# Patient Record
Sex: Female | Born: 1970 | Race: White | Hispanic: No | Marital: Single | Smoking: Never smoker
Health system: Southern US, Community
[De-identification: ages and names within clinical notes are randomized; demographics above are authoritative.]

## PROBLEM LIST (undated history)

## (undated) DIAGNOSIS — R490 Dysphonia: Secondary | ICD-10-CM

## (undated) DIAGNOSIS — R109 Unspecified abdominal pain: Secondary | ICD-10-CM

## (undated) DIAGNOSIS — R059 Cough, unspecified: Secondary | ICD-10-CM

## (undated) DIAGNOSIS — R630 Anorexia: Secondary | ICD-10-CM

## (undated) DIAGNOSIS — R05 Cough: Secondary | ICD-10-CM

## (undated) DIAGNOSIS — K219 Gastro-esophageal reflux disease without esophagitis: Secondary | ICD-10-CM

## (undated) DIAGNOSIS — R5383 Other fatigue: Secondary | ICD-10-CM

## (undated) DIAGNOSIS — F32A Depression, unspecified: Secondary | ICD-10-CM

## (undated) DIAGNOSIS — Z460 Encounter for fitting and adjustment of spectacles and contact lenses: Secondary | ICD-10-CM

## (undated) DIAGNOSIS — F329 Major depressive disorder, single episode, unspecified: Secondary | ICD-10-CM

## (undated) DIAGNOSIS — F419 Anxiety disorder, unspecified: Secondary | ICD-10-CM

## (undated) HISTORY — DX: Dysphonia: R49.0

## (undated) HISTORY — DX: Cough, unspecified: R05.9

## (undated) HISTORY — DX: Cough: R05

## (undated) HISTORY — DX: Anorexia: R63.0

## (undated) HISTORY — PX: SKIN BIOPSY: SHX1

## (undated) HISTORY — DX: Gastro-esophageal reflux disease without esophagitis: K21.9

## (undated) HISTORY — DX: Other fatigue: R53.83

## (undated) HISTORY — DX: Unspecified abdominal pain: R10.9

## (undated) HISTORY — DX: Encounter for fitting and adjustment of spectacles and contact lenses: Z46.0

---

## 1991-01-12 HISTORY — PX: ORIF FEMUR FRACTURE: SHX2119

## 1998-03-13 ENCOUNTER — Other Ambulatory Visit: Admission: RE | Admit: 1998-03-13 | Discharge: 1998-03-13 | Payer: Self-pay | Admitting: Obstetrics and Gynecology

## 2000-03-30 ENCOUNTER — Other Ambulatory Visit: Admission: RE | Admit: 2000-03-30 | Discharge: 2000-03-30 | Payer: Self-pay | Admitting: Obstetrics and Gynecology

## 2001-04-18 ENCOUNTER — Other Ambulatory Visit: Admission: RE | Admit: 2001-04-18 | Discharge: 2001-04-18 | Payer: Self-pay | Admitting: Obstetrics and Gynecology

## 2001-07-09 ENCOUNTER — Encounter: Payer: Self-pay | Admitting: Emergency Medicine

## 2001-07-09 ENCOUNTER — Emergency Department (HOSPITAL_COMMUNITY): Admission: EM | Admit: 2001-07-09 | Discharge: 2001-07-09 | Payer: Self-pay | Admitting: Emergency Medicine

## 2002-11-27 ENCOUNTER — Ambulatory Visit (HOSPITAL_COMMUNITY): Admission: RE | Admit: 2002-11-27 | Discharge: 2002-11-27 | Payer: Self-pay | Admitting: Infectious Diseases

## 2005-12-17 ENCOUNTER — Ambulatory Visit (HOSPITAL_COMMUNITY): Admission: RE | Admit: 2005-12-17 | Discharge: 2005-12-17 | Payer: Self-pay | Admitting: Surgery

## 2005-12-21 ENCOUNTER — Encounter: Admission: RE | Admit: 2005-12-21 | Discharge: 2006-03-21 | Payer: Self-pay | Admitting: Surgery

## 2005-12-27 ENCOUNTER — Ambulatory Visit (HOSPITAL_COMMUNITY): Admission: RE | Admit: 2005-12-27 | Discharge: 2005-12-27 | Payer: Self-pay | Admitting: Surgery

## 2006-02-14 ENCOUNTER — Ambulatory Visit (HOSPITAL_COMMUNITY): Admission: RE | Admit: 2006-02-14 | Discharge: 2006-02-15 | Payer: Self-pay | Admitting: Surgery

## 2006-02-14 HISTORY — PX: OTHER SURGICAL HISTORY: SHX169

## 2006-05-23 ENCOUNTER — Emergency Department (HOSPITAL_COMMUNITY): Admission: EM | Admit: 2006-05-23 | Discharge: 2006-05-23 | Payer: Self-pay | Admitting: Emergency Medicine

## 2006-05-23 ENCOUNTER — Ambulatory Visit (HOSPITAL_COMMUNITY): Admission: RE | Admit: 2006-05-23 | Discharge: 2006-05-23 | Payer: Self-pay | Admitting: Emergency Medicine

## 2007-04-03 ENCOUNTER — Encounter: Admission: RE | Admit: 2007-04-03 | Discharge: 2007-04-03 | Payer: Self-pay | Admitting: Surgery

## 2010-01-13 ENCOUNTER — Ambulatory Visit (HOSPITAL_COMMUNITY)
Admission: RE | Admit: 2010-01-13 | Discharge: 2010-01-13 | Payer: Self-pay | Source: Home / Self Care | Attending: Gastroenterology | Admitting: Gastroenterology

## 2010-05-29 NOTE — Op Note (Signed)
NAME:  Colleen Schmitt, Colleen Schmitt           ACCOUNT NO.:  [id number]   MEDICAL RECORD NO.:  [id number]          PATIENT TYPE:  AMB   LOCATION:  DAY                          FACILITY:  [facility]   PHYSICIAN:  Sandria Bales. Ezzard Standing, M.D.  DATE OF BIRTH:  07-30-1970   DATE OF PROCEDURE:  02/14/2006  DATE OF DISCHARGE:                               OPERATIVE REPORT   PREOPERATIVE DIAGNOSIS:  Morbid obesity with a weight of 239 pounds, a  basal metabolic index of 35.4.   POSTOPERATIVE DIAGNOSIS:  Morbid obesity with a weight of 239 pounds, a  basal metabolic index of 35.4.   PROCEDURE:  Laparoscopic band placement with an APS band.   SURGEON:  Sandria Bales. Ezzard Standing, M.D.   ASSISTANT:  Thornton Park. Daphine Deutscher, M.D.   ANESTHESIA:  General endotracheal.   ESTIMATED BLOOD LOSS:  Minimal.   INDICATIONS FOR PROCEDURE:  Ms. Propp is a 40 year old white female  who is a patient of Dr. Adelfa Koh. Tisovec, who has been morbidly obese  much of her adult life.  She has multiple co-morbid problems associated  with her obesity, which include adult asthma, gastroesophageal reflux,  low back and knee pain, secondary to degenerative joint disease.  The  patient now comes for an attempted laparoscopic band procedure. She has  been through our preoperative evaluation.   The indications and potential complications of lap banding were  explained to the patient.  The potential complications include, but are  not limited to, bleeding, infection, long-term nutritional consequences  and possible band erosion and band migration.   DESCRIPTION OF PROCEDURE:  The patient is placed in the supine position  and given a general endotracheal anesthetic.  Her abdomen was prepped  with Betadine solution and sterilely draped.  She had been given 1 gram  of Ancef preoperatively.  She had PAS stockings in place and was on  subcutaneous heparin.  A time out was held, identifying the patient  and the procedure.   The abdominal cavity was  then accessed through the left upper quadrant  with an 11 mm Optiview trocar.  The abdomen was insufflated.  An  abdominal exploration was carried out revealing a somewhat large liver  and fatty falciform ligament.  Her stomach and both lobes of the liver  otherwise unremarkable.  A 5 mm port was placed for the sub-xiphoid for  the liver retractor.  A 15 mm in the right subcostal for port insertion.  An 11 mm trocar in the right paramedian.  An 11 mm in the left  paramedian for the camera and then a 5 mm port lateral to the access to  port.  I placed in a Nathanson liver retractor.   A lot of Sinia's weight is in her buttocks and hips as she is a pear-  shaped patient, so her upper abdomen had less than probably average fat  in it.  I identified the gastroesophageal junction and opened the angle  of His.  I opened the lesser sac and then went along at the base of the  right crus and passed the  finger dissector in a retro-gastric  fashion.  I then introduced the APS band system through the 15mm port  and passed this around the proximal stomach or immediately below the  gastroesophageal junction.  At this time anesthesia passed a lap band  sizer downt the esophagus.  The lap band was closed without difficulty  around the sizer.  The sizer was then removed.   I then imbricated the stomach around the band, placing three sutures of  #2-0 Ethibond sutures.  The band seemed to lay in good position.  There  was no bleeding at the site.  Again we did a really minimal dissection.  The tubing was then brought out through the right paramedian incision.  All the trocars were removed and turned under direct visualization.  There is no bleeding at any trocar site.   I enlarged the right upper quadrant incision enough to place the  reservoir.  I placed four Prolene sutures and attached the reservoir to  the band tubing and sewed it in place.  The reservoir lay flat.  I then  closed the skin with a  #3-0 Ethibond suture and the skin site with a #5-  0 Monocryl suture.  Painted with tincture of Benzoin and Steri-Strips.   The patient tolerated the procedure well and was transported to the  recovery room in good condition.  The sponge and needle counts were  correct at the end of the case.      Sandria Bales. Ezzard Standing, M.D.  Electronically Signed     DHN/MEDQ  D:  02/14/2006  T:  02/14/2006  Job:  [id number]   cc:   Gaspar Garbe, M.D.  Fax: [phone]

## 2011-01-22 ENCOUNTER — Encounter (INDEPENDENT_AMBULATORY_CARE_PROVIDER_SITE_OTHER): Payer: Self-pay

## 2011-01-22 ENCOUNTER — Ambulatory Visit (INDEPENDENT_AMBULATORY_CARE_PROVIDER_SITE_OTHER): Payer: Commercial Managed Care - PPO | Admitting: Physician Assistant

## 2011-01-22 VITALS — BP 122/76 | HR 68 | Temp 97.6°F | Resp 16 | Ht 69.0 in | Wt 204.6 lb

## 2011-01-22 DIAGNOSIS — Z4651 Encounter for fitting and adjustment of gastric lap band: Secondary | ICD-10-CM

## 2011-01-22 DIAGNOSIS — E669 Obesity, unspecified: Secondary | ICD-10-CM

## 2011-01-22 DIAGNOSIS — K219 Gastro-esophageal reflux disease without esophagitis: Secondary | ICD-10-CM

## 2011-01-22 NOTE — Patient Instructions (Signed)
Take nexium twice daily for one week followed by once daily for a week. Return in two weeks or sooner if your reflux symptoms persist.

## 2011-01-22 NOTE — Progress Notes (Signed)
  HISTORY: Colleen Schmitt is a 41 y.o.female who received an AP-Standard lap-band in February 2008 by Dr. Ezzard Standing. She comes in with a two week history of GERD symptoms and solid food dysphagia. She has suffered an upper respiratory infection with cough that began on Christmas and likely precipitated her other symptoms. She has been taking Nexium with limited success.  VITAL SIGNS: Filed Vitals:   01/22/11 1406  BP: 122/76  Pulse: 68  Temp: 97.6 F (36.4 C)  Resp: 16    PHYSICAL EXAM: Physical exam reveals a very well-appearing 41 y.o.female in no apparent distress Neurologic: Awake, alert, oriented Psych: Bright affect, conversant Respiratory: Breathing even and unlabored. No stridor or wheezing Abdomen: Soft, nontender, nondistended to palpation. Incisions well-healed. No incisional hernias. Port easily palpated. Extremities: Atraumatic, good range of motion.  ASSESMENT: 41 y.o.  female  s/p AP-Standard lap-band.   PLAN: The patient's port was accessed with a 20G Huber needle without difficulty. Clear fluid was aspirated and 0.5 mL saline was removed from the port to give a total predicted volume of 3.5 mL. The patient was able to swallow water without difficulty following the procedure and was instructed to take clear liquids for the next 24-48 hours and advance slowly as tolerated. I asked her to take nexium twice daily for a week followed by once daily for a week. Return in two weeks or sooner if necessary.

## 2011-02-11 ENCOUNTER — Encounter (INDEPENDENT_AMBULATORY_CARE_PROVIDER_SITE_OTHER): Payer: Commercial Managed Care - PPO

## 2011-02-12 ENCOUNTER — Encounter (INDEPENDENT_AMBULATORY_CARE_PROVIDER_SITE_OTHER): Payer: Self-pay

## 2011-02-12 ENCOUNTER — Encounter (INDEPENDENT_AMBULATORY_CARE_PROVIDER_SITE_OTHER): Payer: Commercial Managed Care - PPO

## 2011-02-12 ENCOUNTER — Ambulatory Visit (INDEPENDENT_AMBULATORY_CARE_PROVIDER_SITE_OTHER): Payer: Commercial Managed Care - PPO | Admitting: Physician Assistant

## 2011-02-12 VITALS — BP 126/84 | Ht 69.0 in | Wt 213.4 lb

## 2011-02-12 DIAGNOSIS — Z4651 Encounter for fitting and adjustment of gastric lap band: Secondary | ICD-10-CM

## 2011-02-12 NOTE — Patient Instructions (Signed)
Take clear liquids for the next 48 hours. Thin protein shakes are ok to start on Saturday evening. You may begin foods with the consistency of yogurt, cottage cheese, cream soups, etc. on Sunday. Call us if you have persistent vomiting or regurgitation, night cough or reflux symptoms. Return as scheduled or sooner if you notice no changes in hunger/portion sizes.   

## 2011-02-12 NOTE — Progress Notes (Signed)
  HISTORY: Colleen Schmitt is a 41 y.o.female who received an AP-Standard lap-band in February 2008 by Dr. Ezzard Standing. She comes in after 3 weeks on a lap-band holiday secondary to an upper respiratory infection that caused some over-restriction. She now has no further symptoms except for hunger and increased portion sizes.  VITAL SIGNS: Filed Vitals:   02/12/11 1519  BP: 126/84    PHYSICAL EXAM: Physical exam reveals a very well-appearing 42 y.o.female in no apparent distress Neurologic: Awake, alert, oriented Psych: Bright affect, conversant Respiratory: Breathing even and unlabored. No stridor or wheezing Abdomen: Soft, nontender, nondistended to palpation. Incisions well-healed. No incisional hernias. Port easily palpated. Extremities: Atraumatic, good range of motion.  ASSESMENT: 41 y.o.  female  s/p AP-Standard lap-band.   PLAN: The patient's port was accessed with a 20G Huber needle without difficulty. Clear fluid was aspirated and 0.5 mL saline was added to the port to give a total predicted volume of 4 mL. The patient was able to swallow water without difficulty following the procedure and was instructed to take clear liquids for the next 24-48 hours and advance slowly as tolerated.

## 2011-03-01 ENCOUNTER — Telehealth (INDEPENDENT_AMBULATORY_CARE_PROVIDER_SITE_OTHER): Payer: Self-pay

## 2011-03-01 NOTE — Telephone Encounter (Signed)
Patient would like a band fill.  She needs more restriction.  She needs an appointment 1st thing in the am or late pm.  She will be coming from 30 minutes away so please call her if you  can work her in during those time frames.

## 2011-03-05 ENCOUNTER — Encounter (INDEPENDENT_AMBULATORY_CARE_PROVIDER_SITE_OTHER): Payer: Commercial Managed Care - PPO

## 2011-03-11 ENCOUNTER — Encounter (INDEPENDENT_AMBULATORY_CARE_PROVIDER_SITE_OTHER): Payer: Commercial Managed Care - PPO

## 2011-03-26 ENCOUNTER — Ambulatory Visit (INDEPENDENT_AMBULATORY_CARE_PROVIDER_SITE_OTHER): Payer: Commercial Managed Care - PPO | Admitting: General Surgery

## 2011-03-26 ENCOUNTER — Encounter (INDEPENDENT_AMBULATORY_CARE_PROVIDER_SITE_OTHER): Payer: Self-pay | Admitting: General Surgery

## 2011-03-26 VITALS — BP 122/86 | HR 81 | Temp 97.2°F | Ht 69.0 in | Wt 205.8 lb

## 2011-03-26 DIAGNOSIS — E669 Obesity, unspecified: Secondary | ICD-10-CM

## 2011-03-26 NOTE — Progress Notes (Signed)
Chief complaint: Dysphagia and reflux post lap band fill  History: Patient worked in urgently to the office due to worsening dysphagia and reflux. She is status post lap band placement in February 2008 by Dr. Ezzard Standing. She last had a fill of 0.5 cc on February 1. She previously had had to have fluid removed about January of this year as well. She developed an acute illness with nausea vomiting and diarrhea diagnosed as norovirus over a week ago by her PCP. Since that time she has had persistent nighttime reflux and some regurgitation and dysphagia of solid foods and some difficulty with liquids.  With this information I went ahead and removed the 0.5 cc filled that she had 6 weeks ago. She was able to tolerate water well. I asked her to return to see Dr. Ezzard Standing in one month. I told her I would consider a 0.25 cc fill at that time.

## 2011-04-06 ENCOUNTER — Ambulatory Visit (INDEPENDENT_AMBULATORY_CARE_PROVIDER_SITE_OTHER): Payer: Commercial Managed Care - PPO | Admitting: Surgery

## 2011-04-06 ENCOUNTER — Encounter (INDEPENDENT_AMBULATORY_CARE_PROVIDER_SITE_OTHER): Payer: Self-pay | Admitting: Surgery

## 2011-04-06 VITALS — BP 132/76 | Ht 69.0 in | Wt 207.0 lb

## 2011-04-06 DIAGNOSIS — K219 Gastro-esophageal reflux disease without esophagitis: Secondary | ICD-10-CM

## 2011-04-06 MED ORDER — PANTOPRAZOLE SODIUM 40 MG PO TBEC: 40.0000 mg | DELAYED_RELEASE_TABLET | Freq: Every day | ORAL | Status: DC

## 2011-04-06 NOTE — Progress Notes (Signed)
Pt presents due to heartburn and mild dysphagia.  She is able to eat but feel a mild sensation of food getting caught and heartburn.  She has a Lap band in place and had fluid removed 2 weeks ago with some improvement. Exam: soft non tender band noted.  Impression GERD and mild dysphagia Plan: I discussed medical treatment and removing fluid from the band as well.  She is traveling and felt that removing fluid would be helpful.  Under sterile conditions I removed 0.5 CC without difficulty.  I recommend protonix 40 mg daily and mylanta 15 cc daily.  She is scheduled to return to see Dr Ezzard Standing in two weeks. If this persists she may need UGI.

## 2011-04-06 NOTE — Patient Instructions (Signed)
Try protonix 40 mg daily  Add mylanta 15 ml by mouth every 4 hours as needed for heartburn.

## 2011-04-22 ENCOUNTER — Ambulatory Visit (INDEPENDENT_AMBULATORY_CARE_PROVIDER_SITE_OTHER): Payer: Commercial Managed Care - PPO | Admitting: Surgery

## 2011-04-22 ENCOUNTER — Encounter (INDEPENDENT_AMBULATORY_CARE_PROVIDER_SITE_OTHER): Payer: Self-pay | Admitting: Surgery

## 2011-04-22 VITALS — BP 112/72 | HR 76 | Temp 99.0°F | Resp 16 | Ht 69.0 in | Wt 206.2 lb

## 2011-04-22 DIAGNOSIS — Z9884 Bariatric surgery status: Secondary | ICD-10-CM

## 2011-04-22 NOTE — Progress Notes (Signed)
[facility]  Ovidio Kin, MD,  FACS [address] [place]  [address] [place], [place]    [address] Phone:  [phone] FAX:  [phone]   Re:   Colleen Schmitt DOB:   [birth year] MRN:   [id number]  ASSESSMENT AND PLAN: 1.  Lap band, APS, 02/14/2006.  Initial weight - 241, BMI - 35.6  She has done well, though she has had trouble breaking the 200 pound barrier.  She has been to the office 3 times this year for fluid out, fluid in, fluid out - she had reflux before the last fluid out.  She is on the border between the yellow zone and green zone.  I think she is best to stay put for 3 months - then see where she is.  She has held her weight stable during that time.  Return in 3 months.  2.  Recent trouble, such as reflux, with lap band being too tight.  In part this has been due to illness.  HISTORY OF PRESENT ILLNESS: Chief Complaint  Patient presents with  . Lap Band Fill    Colleen Schmitt is a 41 y.o. (DOB: [birth year])  white female who is a patient of TISOVEC,RICHARD W, MD, MD and comes to me today for follow up of lap band. She just got back from [place].   She has been to the office 3 times this year for fluid out, fluid in, fluid out - she had reflux before the last fluid out.  She has about 3.5 cc in her band.  She had a chick fillet sandwich for lunch and did not feel full, but she did not think that she could eat another sandwich.  So I think, she is on the border between the yellow zone and green zone.  Because she has been up and down so much this year, I think she is best to stay put for 3 months - then see where she is.  She has held her weight stable during that time.  She feels comfortable with this plan.  PHYSICAL EXAM: BP 112/72  Pulse 76  Temp(Src) 99 F (37.2 C) (Temporal)  Resp 16  Ht 5\' 9"  (1.753 m)  Wt 206 lb 3.2 oz (93.532 kg)  BMI 30.45 kg/m2  I did not examine her today.  But I did spend about 15 minutes face to face with  her.  DATA REVIEWED: Old chart.  Ovidio Kin, MD, FACS Office:  [phone]

## 2011-06-15 ENCOUNTER — Encounter (INDEPENDENT_AMBULATORY_CARE_PROVIDER_SITE_OTHER): Payer: Self-pay | Admitting: Surgery

## 2011-06-17 ENCOUNTER — Ambulatory Visit (INDEPENDENT_AMBULATORY_CARE_PROVIDER_SITE_OTHER): Payer: Commercial Managed Care - PPO | Admitting: Physician Assistant

## 2011-06-17 ENCOUNTER — Encounter (INDEPENDENT_AMBULATORY_CARE_PROVIDER_SITE_OTHER): Payer: Self-pay

## 2011-06-17 VITALS — BP 130/84 | Ht 69.0 in | Wt 213.8 lb

## 2011-06-17 DIAGNOSIS — Z4651 Encounter for fitting and adjustment of gastric lap band: Secondary | ICD-10-CM

## 2011-06-17 NOTE — Patient Instructions (Signed)
Take clear liquids tonight. Thin protein shakes are ok to start tomorrow morning. Slowly advance your diet thereafter. Call us if you have persistent vomiting or regurgitation, night cough or reflux symptoms. Return as scheduled or sooner if you notice no changes in hunger/portion sizes.  

## 2011-06-17 NOTE — Progress Notes (Signed)
  HISTORY: BOBI DAUDELIN is a 41 y.o.female who received an AP-Standard lap-band in February 2008 by Dr. Ezzard Standing. She comes in with increasing hunger and portion sizes. No regurgitation or reflux. She wants a small adjustment to avoid over-restriction. She's currently on azithromycin for sinus infection.  VITAL SIGNS: Filed Vitals:   06/17/11 1147  BP: 130/84    PHYSICAL EXAM: Physical exam reveals a very well-appearing 41 y.o.female in no apparent distress Neurologic: Awake, alert, oriented Psych: Bright affect, conversant Respiratory: Breathing even and unlabored. No stridor or wheezing Abdomen: Soft, nontender, nondistended to palpation. Incisions well-healed. No incisional hernias. Port easily palpated. Extremities: Atraumatic, good range of motion.  ASSESMENT: 41 y.o.  female  s/p AP-Standard lap-band.   PLAN: The patient's port was accessed with a 20G Huber needle without difficulty. Clear fluid was aspirated and 0.25 mL saline was added to the port to give a total predicted volume of 3.75 mL. The patient was able to swallow water without difficulty following the procedure and was instructed to take clear liquids for the next 24-48 hours and advance slowly as tolerated.

## 2011-07-09 ENCOUNTER — Encounter (INDEPENDENT_AMBULATORY_CARE_PROVIDER_SITE_OTHER): Payer: Self-pay | Admitting: Surgery

## 2011-07-09 ENCOUNTER — Ambulatory Visit (INDEPENDENT_AMBULATORY_CARE_PROVIDER_SITE_OTHER): Payer: Commercial Managed Care - PPO | Admitting: Surgery

## 2011-07-09 VITALS — BP 142/80 | Ht 69.0 in | Wt 216.0 lb

## 2011-07-09 DIAGNOSIS — Z9884 Bariatric surgery status: Secondary | ICD-10-CM

## 2011-07-09 NOTE — Progress Notes (Signed)
[facility]  Ovidio Kin, MD,  FACS [address] [place]  [address] [place], [place]    [address] Phone:  [phone] FAX:  [phone]   Re:   Colleen Schmitt DOB:   [birth year] MRN:   [id number]  ASSESSMENT AND PLAN: 1.  Lap band, APS, 02/14/2006.  Initial weight - 241, BMI - 35.6  She has done well, though she has had trouble breaking the 200 pound barrier.  I last saw her 04/22/2011 and she was doing well with her weight at 206.  She saw Mardelle Matte on 06/17/2011 - he added 0.25 cc - for a total 3.75 cc in the lap band.  But she feels no resistance and has gained about 10 pounds since I saw her.  Today  I added 1.0 cc to get her to 4.0 cc.   She will see me back in 3 months.  HISTORY OF PRESENT ILLNESS: Chief Complaint  Patient presents with  . Lap Band Fill   Colleen Schmitt is a 41 y.o. (DOB: 20-Apr-1970)  white female who is a patient of TISOVEC,RICHARD W, MD and comes to me today for follow up of lap band.  She just saw Mardelle Matte 3 weeks ago and he added a little fluid.  But she feels like she has no resistance and has gained weight.  So she is interested in more fluid in her band, though it has only been 3 weeks since her last fill.  She feels like she is still is in the "yellow" zone.  She is hungry and feels little limit to what she can eat.  So I added 1.0 cc to get her to 4.0 cc.  She tolerated water.  She goes to [facility] Monday, 7/1, but knows to call if the band is too tight.  We agreed that a 3 month follow up seemed reasonable.   We talked again about exercise.  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 She knows that our goal is one 5 to 6 days a week.  PHYSICAL EXAM: BP 142/80  Ht 5\' 9"  (1.753 m)  Wt 216 lb (97.977 kg)  BMI 31.90 kg/m2  Abdomen:  Soft.  Port easy to feel.  No mass or hernia.  Procedure:  While in the office, I accessed her port.  I measured 3.0 cc in the band.  So I added  1.0 cc to get her to 4.0 cc total.  DATA REVIEWED: Old chart.  Ovidio Kin, MD, FACS Office:  [phone]

## 2011-07-16 ENCOUNTER — Encounter (INDEPENDENT_AMBULATORY_CARE_PROVIDER_SITE_OTHER): Payer: Self-pay | Admitting: Surgery

## 2011-07-16 ENCOUNTER — Ambulatory Visit (INDEPENDENT_AMBULATORY_CARE_PROVIDER_SITE_OTHER): Payer: Commercial Managed Care - PPO | Admitting: Surgery

## 2011-07-16 VITALS — BP 120/80 | Ht 69.0 in | Wt 207.6 lb

## 2011-07-16 DIAGNOSIS — Z9884 Bariatric surgery status: Secondary | ICD-10-CM

## 2011-07-16 NOTE — Progress Notes (Signed)
Has noted that her band seemed to be too tight after her last bandfill 0.25 cc removed from her band.   Keep regularly scheduled appt.

## 2011-07-16 NOTE — Patient Instructions (Signed)

## 2011-07-22 ENCOUNTER — Encounter (INDEPENDENT_AMBULATORY_CARE_PROVIDER_SITE_OTHER): Payer: Self-pay

## 2011-07-22 ENCOUNTER — Ambulatory Visit (INDEPENDENT_AMBULATORY_CARE_PROVIDER_SITE_OTHER): Payer: Commercial Managed Care - PPO | Admitting: Surgery

## 2011-07-22 VITALS — BP 120/78 | HR 64 | Temp 97.8°F | Resp 14 | Ht 69.0 in | Wt 208.6 lb

## 2011-07-22 DIAGNOSIS — Z4651 Encounter for fitting and adjustment of gastric lap band: Secondary | ICD-10-CM

## 2011-07-22 NOTE — Progress Notes (Signed)
  HISTORY: Colleen Schmitt is a 41 y.o.female who received an AP-Standard lap-band in February 2008 by Dr. Ezzard Standing. She comes in less than one week after seeing Dr. Ezzard Standing for fluid removal but unfortunately she's having persistent reflux and solid food intolerance. She's improved somewhat with the last fluid removal of 0.25 mL but still cannot eat grilled chicken strips or salad.  VITAL SIGNS: Filed Vitals:   07/22/11 0934  BP: 120/78  Pulse: 64  Temp: 97.8 F (36.6 C)  Resp: 14    PHYSICAL EXAM: Physical exam reveals a very well-appearing 41 y.o.female in no apparent distress Neurologic: Awake, alert, oriented Psych: Bright affect, conversant Respiratory: Breathing even and unlabored. No stridor or wheezing Abdomen: Soft, nontender, nondistended to palpation. Incisions well-healed. No incisional hernias. Port easily palpated. Extremities: Atraumatic, good range of motion.  ASSESMENT: 41 y.o.  female  s/p AP-Standard lap-band.   PLAN: The patient's port was accessed with a 20G Huber needle without difficulty. Clear fluid was aspirated and 0.5 mL saline was removed from the port to give a total predicted volume of 3.25 mL. She'll follow-up with Dr. Ezzard Standing in September or earlier if needed.

## 2011-07-22 NOTE — Patient Instructions (Signed)
Return as scheduled 

## 2011-08-26 ENCOUNTER — Encounter (INDEPENDENT_AMBULATORY_CARE_PROVIDER_SITE_OTHER): Payer: Self-pay

## 2011-08-26 ENCOUNTER — Ambulatory Visit (INDEPENDENT_AMBULATORY_CARE_PROVIDER_SITE_OTHER): Payer: Commercial Managed Care - PPO | Admitting: Physician Assistant

## 2011-08-26 VITALS — BP 120/70 | HR 60 | Temp 98.8°F | Resp 18 | Ht 69.0 in | Wt 214.4 lb

## 2011-08-26 DIAGNOSIS — Z4651 Encounter for fitting and adjustment of gastric lap band: Secondary | ICD-10-CM

## 2011-08-26 NOTE — Patient Instructions (Signed)
Take clear liquids tonight. Thin protein shakes are ok to start tomorrow morning. Slowly advance your diet thereafter. Call us if you have persistent vomiting or regurgitation, night cough or reflux symptoms. Return as scheduled or sooner if you notice no changes in hunger/portion sizes.  

## 2011-08-26 NOTE — Progress Notes (Signed)
  HISTORY: Colleen Schmitt is a 41 y.o.female who received an AP-Standard lap-band in Feb 2008 by Dr. Ezzard Standing. She comes in with about 10 lbs of weight gain since her last visit due to significant hunger and large portion sizes. She had fluid removed for over-restriction. She'd like to get back on track with a fill today.  VITAL SIGNS: Filed Vitals:   08/26/11 1008  BP: 120/70  Pulse: 60  Temp: 98.8 F (37.1 C)  Resp: 18    PHYSICAL EXAM: Physical exam reveals a very well-appearing 41 y.o.female in no apparent distress Neurologic: Awake, alert, oriented Psych: Bright affect, conversant Respiratory: Breathing even and unlabored. No stridor or wheezing Abdomen: Soft, nontender, nondistended to palpation. Incisions well-healed. No incisional hernias. Port easily palpated. Extremities: Atraumatic, good range of motion.  ASSESMENT: 41 y.o.  female  s/p AP-Standard lap-band.   PLAN: The patient's port was accessed with a 20G Huber needle without difficulty. Clear fluid was aspirated and 0.25 mL saline was added to the port to give a total predicted volume of 3.5 mL. The patient was able to swallow water without difficulty following the procedure and was instructed to take clear liquids for the next 24-48 hours and advance slowly as tolerated.

## 2011-09-30 ENCOUNTER — Encounter (INDEPENDENT_AMBULATORY_CARE_PROVIDER_SITE_OTHER): Payer: Self-pay | Admitting: Surgery

## 2011-09-30 ENCOUNTER — Ambulatory Visit (INDEPENDENT_AMBULATORY_CARE_PROVIDER_SITE_OTHER): Payer: Commercial Managed Care - PPO | Admitting: Surgery

## 2011-09-30 VITALS — BP 112/62 | HR 76 | Temp 98.6°F | Resp 16 | Ht 69.0 in | Wt 215.2 lb

## 2011-09-30 DIAGNOSIS — Z9884 Bariatric surgery status: Secondary | ICD-10-CM

## 2011-09-30 NOTE — Progress Notes (Signed)
[facility]  Ovidio Kin, MD,  FACS [address] [place]  [address] [place], [place]    [address] Phone:  [phone] FAX:  [phone]   Re:   Colleen Schmitt DOB:   04-Jan-1971 MRN:   [id number]  ASSESSMENT AND PLAN: 1.  Lap band, APS, 02/14/2006.  Initial weight - 241, BMI - 35.6  I last saw her 07/09/2011 - since then she has seen Dr. Daphine Deutscher once and Rodman twice.  She thinks that she is still too loose.  She seems to have a narrow "green zone".  I added 0.6 cc today, to get her to 3.8 cc in her lap band.  She will see Mardelle Matte back in November.  2.  I suggested that she increase her exercise.  Also suggested keeping a food diary.  HISTORY OF PRESENT ILLNESS: Chief Complaint  Patient presents with  . Lap Band Fill   Colleen Schmitt is a 41 y.o. (DOB: 22-Feb-1970)  white female who is a patient of TISOVEC,RICHARD W, MD and comes to me today for follow up of lap band.    She comes today to evaluate her lap band.  She still feels that she is in the "yellow zone".  She ate a whole chick filet last Saturday.  She does not feel tight (usually) in the AM.  She is trying to eat solid food for breakfast.  She last saw me 07/09/2011.  I added 1 cc to get her to 4 cc. Thne she saw Dr. Daphine Deutscher on 07/16/2011 who removed 0.25 cc.  Then she saw Mardelle Matte 07/22/2011 who removed 0.5 cc.   Then she saw Mardelle Matte 08/26/2011 who added 0.25 cc.  Her estimated volume is 3.5 cc.  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 She knows that our goal is one 5 to 6 days a week, but she is not doing much better her training sessions.  PHYSICAL EXAM: BP 112/62  Pulse 76  Temp 98.6 F (37 C) (Temporal)  Resp 16  Ht 5\' 9"  (1.753 m)  Wt 215 lb 3.2 oz (97.614 kg)  BMI 31.78 kg/m2  Abdomen:  Soft.  Port easy to feel.  No mass or hernia.  Procedure:  While in the office, I accessed her port.  I measured 3.2 cc in the band.  So I added 0.6 cc to  get her to 3.8 cc total.  DATA REVIEWED: Old chart.  Ovidio Kin, MD, FACS Office:  [phone]

## 2011-10-28 ENCOUNTER — Ambulatory Visit (INDEPENDENT_AMBULATORY_CARE_PROVIDER_SITE_OTHER): Payer: Commercial Managed Care - PPO | Admitting: Physician Assistant

## 2011-10-28 ENCOUNTER — Encounter (INDEPENDENT_AMBULATORY_CARE_PROVIDER_SITE_OTHER): Payer: Self-pay

## 2011-10-28 VITALS — BP 106/64 | HR 64 | Ht 69.0 in | Wt 207.6 lb

## 2011-10-28 DIAGNOSIS — Z4651 Encounter for fitting and adjustment of gastric lap band: Secondary | ICD-10-CM

## 2011-10-28 NOTE — Patient Instructions (Signed)
Return as appointed. Focus on good food choices as well as physical activity. Return sooner if you have an increase in hunger, portion sizes or weight. Return also for difficulty swallowing, night cough, reflux.

## 2011-10-28 NOTE — Progress Notes (Signed)
  HISTORY: Colleen Schmitt is a 41 y.o.female who received an AP-Standard lap-band in February 2008 by Dr. Ezzard Standing. She comes in with a recent history of cough and indigestion by day. She's able to tolerate small amounts of solids but relies on liquids primarily.  VITAL SIGNS: Filed Vitals:   10/28/11 1611  BP: 106/64  Pulse: 64    PHYSICAL EXAM: Physical exam reveals a very well-appearing 41 y.o.female in no apparent distress Neurologic: Awake, alert, oriented Psych: Bright affect, conversant Respiratory: Breathing even and unlabored. No stridor or wheezing Abdomen: Soft, nontender, nondistended to palpation. Incisions well-healed. No incisional hernias. Port easily palpated. Extremities: Atraumatic, good range of motion.  ASSESMENT: 41 y.o.  female  s/p AP-Standard lap-band.   PLAN: The patient's port was accessed with a 20G Huber needle without difficulty. Clear fluid was aspirated and 0.2 mL was removed from the band to give 3.6 mL total volume. We'll have her back in one month or sooner if needed.

## 2011-11-24 ENCOUNTER — Telehealth (INDEPENDENT_AMBULATORY_CARE_PROVIDER_SITE_OTHER): Payer: Self-pay | Admitting: General Surgery

## 2011-11-24 NOTE — Telephone Encounter (Signed)
Called patient to see if she could come a little earlier about 2:15 tomorrow...left message for patient to call me back and have me overhead paged

## 2011-11-25 ENCOUNTER — Ambulatory Visit (INDEPENDENT_AMBULATORY_CARE_PROVIDER_SITE_OTHER): Payer: Commercial Managed Care - PPO | Admitting: Physician Assistant

## 2011-11-25 ENCOUNTER — Encounter (INDEPENDENT_AMBULATORY_CARE_PROVIDER_SITE_OTHER): Payer: Commercial Managed Care - PPO

## 2011-11-25 ENCOUNTER — Encounter (INDEPENDENT_AMBULATORY_CARE_PROVIDER_SITE_OTHER): Payer: Self-pay

## 2011-11-25 VITALS — BP 132/76 | HR 71 | Temp 97.8°F | Resp 18 | Ht 69.0 in | Wt 204.8 lb

## 2011-11-25 DIAGNOSIS — Z4651 Encounter for fitting and adjustment of gastric lap band: Secondary | ICD-10-CM

## 2011-11-25 DIAGNOSIS — K219 Gastro-esophageal reflux disease without esophagitis: Secondary | ICD-10-CM

## 2011-11-25 MED ORDER — ESOMEPRAZOLE MAGNESIUM 20 MG PO CPDR: 20.0000 mg | DELAYED_RELEASE_CAPSULE | Freq: Every day | ORAL | Status: DC

## 2011-11-25 NOTE — Progress Notes (Signed)
  HISTORY: Colleen Schmitt is a 41 y.o.female who received an AP-Standard lap-band in February 2008 by Dr. Ezzard Standing. She comes in with continued restrictive symptoms, although greatly improved from her last visit. She's better tolerating solid food but is having night cough and reflux.  VITAL SIGNS: Filed Vitals:   11/25/11 1534  BP: 132/76  Pulse: 71  Temp: 97.8 F (36.6 C)  Resp: 18    PHYSICAL EXAM: Physical exam reveals a very well-appearing 41 y.o.female in no apparent distress Neurologic: Awake, alert, oriented Psych: Bright affect, conversant Respiratory: Breathing even and unlabored. No stridor or wheezing Abdomen: Soft, nontender, nondistended to palpation. Incisions well-healed. No incisional hernias. Port easily palpated. Extremities: Atraumatic, good range of motion.  ASSESMENT: 41 y.o.  female  s/p AP-Standard lap-band.   PLAN: The patient's port was accessed with a 20G Huber needle without difficulty. Clear fluid was aspirated and 0.3 mL saline was removed from the port to give a total predicted volume of 3.3 mL. I've also prescribed Nexium. We'll have her back in three months.

## 2011-11-25 NOTE — Patient Instructions (Signed)
Return in three months. Focus on good food choices as well as physical activity. Return sooner if you have an increase in hunger, portion sizes or weight. Return also for difficulty swallowing, night cough, reflux. Take nexium as prescribed.

## 2011-12-07 ENCOUNTER — Ambulatory Visit (INDEPENDENT_AMBULATORY_CARE_PROVIDER_SITE_OTHER): Payer: Commercial Managed Care - PPO | Admitting: Surgery

## 2011-12-07 ENCOUNTER — Telehealth (INDEPENDENT_AMBULATORY_CARE_PROVIDER_SITE_OTHER): Payer: Self-pay

## 2011-12-07 ENCOUNTER — Telehealth (INDEPENDENT_AMBULATORY_CARE_PROVIDER_SITE_OTHER): Payer: Self-pay | Admitting: General Surgery

## 2011-12-07 ENCOUNTER — Encounter (INDEPENDENT_AMBULATORY_CARE_PROVIDER_SITE_OTHER): Payer: Self-pay | Admitting: Surgery

## 2011-12-07 VITALS — BP 138/84 | HR 72 | Temp 99.1°F | Resp 16 | Ht 69.0 in | Wt 204.8 lb

## 2011-12-07 DIAGNOSIS — Z9884 Bariatric surgery status: Secondary | ICD-10-CM

## 2011-12-07 DIAGNOSIS — K219 Gastro-esophageal reflux disease without esophagitis: Secondary | ICD-10-CM

## 2011-12-07 NOTE — Telephone Encounter (Signed)
Patient aware of appt scheduled with Dr. Ezzard Standing 12/31/11 @ 12:10

## 2011-12-07 NOTE — Patient Instructions (Signed)

## 2011-12-07 NOTE — Progress Notes (Signed)
Colleen Schmitt 41 y.o.  Body mass index is 30.24 kg/(m^2).  Patient Active Problem List  Diagnosis  . Obesity  . Lapband APS Feb 2008    Allergies  Allergen Reactions  . Penicillins Itching    Palms of hands    Past Surgical History  Procedure Date  . Lap band 02/14/2006  . Orif femur fracture 1993    right   Gaspar Garbe, MD No diagnosis found.  Colleen Schmitt comes in today because she is tight and is having reflux. Her preop upper GI back in 2008 showed a small hiatal hernia. I think that this may be making [place] unable to adequately restrict her. Whenever she gets right to nice restricted level she is plagued with nocturnal reflux that is incapacitating.  I showed her the posterior on hiatal hernia repair and explained it to her in some detail. I told her that Dr. Ezzard Standing and I could do this I would like to set up for him to do and I could help him. Prior to doing this I was ordered an upper GI series see what things look like. Since she's a little on the pouch side today I went ahead and removed 0.13 cc from her band. Will get her back seat Colleen Schmitt in about 2-3 weeks. Matt B. Daphine Deutscher, MD, [facility] [phone] beeper [phone]  12/07/2011 4:34 PM

## 2011-12-07 NOTE — Telephone Encounter (Signed)
Pt called for advice about reflux.  She is taking omeprazole BID and still has reflux.  She feels her lap band is too tight.  Discussed options to wait to see Dr. Ezzard Standing or go to next [facility].  She does not want to wait that long, so appt made to remove fluid from lap band today with another.

## 2011-12-22 ENCOUNTER — Ambulatory Visit
Admission: RE | Admit: 2011-12-22 | Discharge: 2011-12-22 | Disposition: A | Discharge status: Home / Self Care | Payer: 59 | Source: Ambulatory Visit | Attending: Surgery | Admitting: Surgery

## 2011-12-22 ENCOUNTER — Other Ambulatory Visit (INDEPENDENT_AMBULATORY_CARE_PROVIDER_SITE_OTHER): Payer: Self-pay | Admitting: Surgery

## 2011-12-22 DIAGNOSIS — K219 Gastro-esophageal reflux disease without esophagitis: Secondary | ICD-10-CM

## 2011-12-22 DIAGNOSIS — Z9884 Bariatric surgery status: Secondary | ICD-10-CM

## 2011-12-31 ENCOUNTER — Encounter (INDEPENDENT_AMBULATORY_CARE_PROVIDER_SITE_OTHER): Payer: Commercial Managed Care - PPO | Admitting: Surgery

## 2012-01-13 ENCOUNTER — Encounter (INDEPENDENT_AMBULATORY_CARE_PROVIDER_SITE_OTHER): Payer: Commercial Managed Care - PPO

## 2012-01-13 ENCOUNTER — Ambulatory Visit (INDEPENDENT_AMBULATORY_CARE_PROVIDER_SITE_OTHER): Payer: Commercial Managed Care - PPO | Admitting: Physician Assistant

## 2012-01-13 ENCOUNTER — Encounter (INDEPENDENT_AMBULATORY_CARE_PROVIDER_SITE_OTHER): Payer: Self-pay

## 2012-01-13 DIAGNOSIS — Z9884 Bariatric surgery status: Secondary | ICD-10-CM

## 2012-01-13 NOTE — Progress Notes (Signed)
Colleen Schmitt needed to see Dr. Ezzard Standing today for discussion of her upper GI findings. She complained of low restriction but I do not feel comfortable performing an adjustment today. We have arranged an appointment with Dr. Ezzard Standing in two weeks.

## 2012-01-19 NOTE — Progress Notes (Signed)
Pt not seen by me

## 2012-01-27 ENCOUNTER — Ambulatory Visit (INDEPENDENT_AMBULATORY_CARE_PROVIDER_SITE_OTHER): Payer: Commercial Managed Care - PPO | Admitting: Surgery

## 2012-01-27 VITALS — BP 132/72 | HR 76 | Temp 99.0°F | Resp 18 | Ht 69.0 in | Wt 208.0 lb

## 2012-01-27 DIAGNOSIS — Z9884 Bariatric surgery status: Secondary | ICD-10-CM

## 2012-01-27 NOTE — Progress Notes (Signed)
[facility]  Ovidio Kin, MD,  FACS [address] [place]  [address] [place], [place]    [address] Phone:  [phone] FAX:  [phone]   Re:   KHADEEJA ELDEN DOB:   [birth year] MRN:   [id number]  ASSESSMENT AND PLAN: 1.  Lap band, APS, 02/14/2006.  Initial weight - 241, BMI - 35.6  I last saw her 09/30/2011 - we counted and she saw someone in our office 13 times in 2013 - sometimes to add fluid and sometimes to take fluid out and sometimes just to visit.  She saw Dr. Daphine Deutscher at the beginning of 12/07/2011  She thinks that she is still too loose.  She seems to have a narrow "green zone".  Her lap band seemed to have less than expected - but I have her at 3.4 cc.  I will see her back in 8 weeks.  2.  Hiatal hernia on UGI - 12/07/2011  We talked quite a while about the impact on the hiatal hernia and filling her lap band.  I said surgery may help her and it may not.  For now, she wants to continue without surgery.  I think we can do this for now.  [Because we saw her so much last year, I did not charge her for this visit.  I will charge her going forward.]  HISTORY OF PRESENT ILLNESS: Chief Complaint  Patient presents with  . Lap Band Fill   TIMMI DEVORA is a 42 y.o. (DOB: 04/03/1970)  white female who is a patient of TISOVEC,RICHARD W, MD and comes to me today for follow up of lap band.    She comes today to evaluate her lap band.  She ate a whole 6 inch sub at [place] before she came over here and felt no resistance.  She talked about her trouble with reflux, which she is not having now.  She takes Nexium or Omeprazole for the heart burn.  I talked to her about using an antacid, which actually may be more effective.  We had a lengthy discussion about the hiatal hernia seen on UGI.  We discussed the options of surgery vs continuing to adjust the lap band without fixing the hiatal hernia.  I think we can try to adjust the lap band for now - and see how she does.   We know the hernia is there.  She said that she is doing better exercising.  We talked about [place]'s win over [facility] last night.  It was a late game, so she went, and I went to bed before the game began.  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  PHYSICAL EXAM: BP 132/72  Pulse 76  Temp 99 F (37.2 C) (Oral)  Resp 18  Ht 5\' 9"  (1.753 m)  Wt 208 lb (94.348 kg)  BMI 30.72 kg/m2  Abdomen:  Soft.  Port easy to feel.  No mass or hernia. Soft.  Procedure:  While in the office, I accessed her port.  I measured 2.6 cc in the band.  So I added 0.8 cc to get her to 3.4 cc total.  DATA REVIEWED: Old chart.  Ovidio Kin, MD, FACS Office:  [phone]

## 2012-02-24 ENCOUNTER — Encounter (INDEPENDENT_AMBULATORY_CARE_PROVIDER_SITE_OTHER): Payer: Commercial Managed Care - PPO

## 2012-03-02 ENCOUNTER — Ambulatory Visit (INDEPENDENT_AMBULATORY_CARE_PROVIDER_SITE_OTHER): Payer: Commercial Managed Care - PPO | Admitting: Surgery

## 2012-03-02 ENCOUNTER — Encounter (INDEPENDENT_AMBULATORY_CARE_PROVIDER_SITE_OTHER): Payer: Self-pay | Admitting: Surgery

## 2012-03-02 ENCOUNTER — Encounter (INDEPENDENT_AMBULATORY_CARE_PROVIDER_SITE_OTHER): Payer: Commercial Managed Care - PPO

## 2012-03-02 VITALS — BP 112/80 | HR 64 | Resp 16 | Ht 69.0 in | Wt 207.8 lb

## 2012-03-02 DIAGNOSIS — K219 Gastro-esophageal reflux disease without esophagitis: Secondary | ICD-10-CM

## 2012-03-02 DIAGNOSIS — Z9884 Bariatric surgery status: Secondary | ICD-10-CM

## 2012-03-02 NOTE — Progress Notes (Addendum)
[facility]  Ovidio Kin, MD,  FACS [address] [place]  [address] [place], [place]    [address] Phone:  [phone] FAX:  [phone]   Re:   Colleen Schmitt DOB:   [birth year] MRN:   [id number]  ASSESSMENT AND PLAN: 1.  Lap band, APS, 02/14/2006.  Initial weight - 241, BMI - 35.6  I last saw her 09/30/2011 - we counted and she saw someone in our office 13 times in 2013 - sometimes to add fluid and sometimes to take fluid out and sometimes just to visit.  She saw Dr. Daphine Deutscher at the beginning of 12/07/2011  The band is too tight.  I removed 0.6 cc, to leave her about 2.8 cc.  She has appointment 03/17/2012, which she will keep if she is still having trouble.  If she is doing well, I will see her in 3 months.  [Dr. Neale Burly found blood in her stool.  She has made an appt with Colleen Schmitt for next week.  I suspect that she will need both a upper endo and colonoscopy.  I'll try to touch base with Colleen Schmitt.  DN 03/30/2012] Colleen Schmitt was supposed to get her endoscopies last week, but could not get off.  She is rescheduled for 05/31/2012.  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 DN 04/25/2012] [Colleen Schmitt called me.  The opening through the lap band is about 2.5 cm.  No evidence of slip of the band.  DN  05/31/2012]  2.  Hiatal hernia on UGI - 12/07/2011  We have talked quite a while about the impact on the hiatal hernia and filling her lap band.  I said surgery may help her and it may not.  For now, she wants to continue without surgery.  I think we can do this for now.  [Because we saw her so much last year, I did not charge her last 2 visist.  I will charge her going forward.]  HISTORY OF PRESENT ILLNESS: Chief Complaint  Patient presents with  . urgent    lap band w/ n&v   Colleen Schmitt is a 42 y.o. (DOB: 10/10/1970)  white female who is a patient of TISOVEC,RICHARD W, MD and comes to me today for follow up of lap band.    She is having reflux and night coughs.  She  is taking Tums and Omperazole - 20 to 40 mg BID.  I talked to her about using an antacid, which actually may be more effective.  She has done more with exercise, though the snow has slowed her down.  But it certainly souns like her band is too tight.  History of the lap band: We had a lengthy discussion about the hiatal hernia seen on UGI.  We discussed the options of surgery vs continuing to adjust the lap band without fixing the hiatal hernia.  I think we can try to adjust the lap band for now - and see how she does.  We know the hernia is there.  She said that she is doing better exercising.  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  PHYSICAL EXAM: BP 112/80  Pulse 64  Resp 16  Ht 5\' 9"  (1.753 m)  Wt 207 lb 12.8 oz (94.257 kg)  BMI 30.67 kg/m2  Abdomen:  Soft.  Port easy to feel.  No mass or hernia. Soft.  Procedure:  While in the office, I accessed her port.  I removed 0.6 cc in the band.  She has 2.8 cc total.  DATA REVIEWED: Old chart.  Ovidio Kin, MD, FACS Office:  [phone]

## 2012-03-03 ENCOUNTER — Encounter (INDEPENDENT_AMBULATORY_CARE_PROVIDER_SITE_OTHER): Payer: Commercial Managed Care - PPO | Admitting: Surgery

## 2012-03-17 ENCOUNTER — Encounter (INDEPENDENT_AMBULATORY_CARE_PROVIDER_SITE_OTHER): Payer: Commercial Managed Care - PPO | Admitting: Surgery

## 2012-03-21 ENCOUNTER — Telehealth (INDEPENDENT_AMBULATORY_CARE_PROVIDER_SITE_OTHER): Payer: Self-pay

## 2012-03-21 NOTE — Telephone Encounter (Signed)
Patient states she does not a appointment at this time with Dr. Ezzard Standing , she will call to schedule

## 2012-03-28 ENCOUNTER — Telehealth (INDEPENDENT_AMBULATORY_CARE_PROVIDER_SITE_OTHER): Payer: Self-pay

## 2012-03-28 NOTE — Telephone Encounter (Signed)
Patient calling into office stating she was seen by her PCP and her stool culture is positive for blood.  Patient was advised to contact Dr. Ezzard Standing for further advice and whether or not she should be seen by our office or if she need's to be seen by a GI physician.  Please call to discuss in further detail.  Patient has h/o lap band placement Feb. 2008.  Patient can be reached at [phone].

## 2012-03-31 ENCOUNTER — Telehealth (INDEPENDENT_AMBULATORY_CARE_PROVIDER_SITE_OTHER): Payer: Self-pay

## 2012-03-31 NOTE — Telephone Encounter (Signed)
Patient states she spoke to Dr. Ezzard Standing 03/30/12; She was advised to follow up with DR. Mann about the blood in her stool . She is scheduled 04/05/12 per patient.

## 2012-04-20 ENCOUNTER — Encounter (INDEPENDENT_AMBULATORY_CARE_PROVIDER_SITE_OTHER): Payer: Self-pay

## 2012-06-15 ENCOUNTER — Encounter (INDEPENDENT_AMBULATORY_CARE_PROVIDER_SITE_OTHER): Payer: Self-pay

## 2012-06-15 ENCOUNTER — Ambulatory Visit (INDEPENDENT_AMBULATORY_CARE_PROVIDER_SITE_OTHER): Payer: Commercial Managed Care - PPO | Admitting: Physician Assistant

## 2012-06-15 VITALS — BP 128/76 | HR 65 | Temp 98.0°F | Resp 18 | Ht 69.0 in | Wt 206.4 lb

## 2012-06-15 DIAGNOSIS — Z4651 Encounter for fitting and adjustment of gastric lap band: Secondary | ICD-10-CM

## 2012-06-15 NOTE — Progress Notes (Signed)
  HISTORY: Colleen Schmitt is a 42 y.o.female who received an AP-Standard lap-band in February 2008 by Dr. Ezzard Standing. She comes in with stable weight since her last visit in February when she had 0.6 mL removed by Dr. Ezzard Standing. She has no further reflux or regurgitation but now has increased hunger and portion sizes. She wants a fill but not so much that it aggravates her hiatal hernia. She is exercising on a regular basis and her clothes do fit looser.  VITAL SIGNS: Filed Vitals:   06/15/12 1548  BP: 128/76  Pulse: 65  Temp: 98 F (36.7 C)  Resp: 18    PHYSICAL EXAM: Physical exam reveals a very well-appearing 42 y.o.female in no apparent distress Neurologic: Awake, alert, oriented Psych: Bright affect, conversant Respiratory: Breathing even and unlabored. No stridor or wheezing Abdomen: Soft, nontender, nondistended to palpation. Incisions well-healed. No incisional hernias. Port easily palpated. Extremities: Atraumatic, good range of motion.  ASSESMENT: 42 y.o.  female  s/p AP-Standard lap-band.   PLAN: The patient's port was accessed with a 20G Huber needle without difficulty. Clear fluid was aspirated and 0.3 mL saline was added to the port to give a total predicted volume of 3.1 mL. The patient was able to swallow water without difficulty following the procedure and was instructed to take clear liquids for the next 24-48 hours and advance slowly as tolerated.

## 2012-06-15 NOTE — Patient Instructions (Signed)
Take clear liquids tonight. Thin protein shakes are ok to start tomorrow morning. Slowly advance your diet thereafter. Call us if you have persistent vomiting or regurgitation, night cough or reflux symptoms. Return as scheduled or sooner if you notice no changes in hunger/portion sizes.  

## 2012-07-10 ENCOUNTER — Encounter (INDEPENDENT_AMBULATORY_CARE_PROVIDER_SITE_OTHER): Payer: Self-pay

## 2012-10-19 ENCOUNTER — Ambulatory Visit (INDEPENDENT_AMBULATORY_CARE_PROVIDER_SITE_OTHER): Payer: Commercial Managed Care - PPO | Admitting: Physician Assistant

## 2012-10-19 ENCOUNTER — Encounter (INDEPENDENT_AMBULATORY_CARE_PROVIDER_SITE_OTHER): Payer: Self-pay

## 2012-10-19 VITALS — BP 114/80 | HR 80 | Resp 14 | Ht 69.0 in | Wt 203.2 lb

## 2012-10-19 DIAGNOSIS — Z4651 Encounter for fitting and adjustment of gastric lap band: Secondary | ICD-10-CM

## 2012-10-19 NOTE — Progress Notes (Signed)
  HISTORY: Colleen Schmitt is a 42 y.o.female who received an AP-Standard lap-band in February 2008 by Dr. Ezzard Standing. She comes in with 3 lbs weight loss since her last visit. Her issue today is increased reflux and dysphagia, although she says it's occasional. She would like some fluid removed so it's easier for her to tolerate solids. She had some complaints of night cough but this has resolved. She is exercising more now and although her weight loss has been modest, she's seen changes in how her clothing fits.  VITAL SIGNS: Filed Vitals:   10/19/12 0937  BP: 114/80  Pulse: 80  Resp: 14    PHYSICAL EXAM: Physical exam reveals a very well-appearing 42 y.o.female in no apparent distress Neurologic: Awake, alert, oriented Psych: Bright affect, conversant Respiratory: Breathing even and unlabored. No stridor or wheezing Abdomen: Soft, nontender, nondistended to palpation. Incisions well-healed. No incisional hernias. Port easily palpated. Extremities: Atraumatic, good range of motion.  ASSESMENT: 42 y.o.  female  s/p AP-Standard lap-band.   PLAN: The patient's port was accessed with a 20G Huber needle without difficulty. Clear fluid was aspirated and 0.2 mL saline was removed from the port to give a total predicted volume of 2.9 mL. The patient was advised to concentrate on healthy food choices and to avoid slider foods high in fats and carbohydrates.

## 2012-10-19 NOTE — Patient Instructions (Signed)
Return as needed. Focus on good food choices as well as physical activity. Return sooner if you have an increase in hunger, portion sizes or weight. Return also for difficulty swallowing, night cough, reflux.   

## 2012-12-14 ENCOUNTER — Encounter (INDEPENDENT_AMBULATORY_CARE_PROVIDER_SITE_OTHER): Payer: Self-pay | Admitting: Physician Assistant

## 2012-12-14 ENCOUNTER — Ambulatory Visit (INDEPENDENT_AMBULATORY_CARE_PROVIDER_SITE_OTHER): Payer: Commercial Managed Care - PPO | Admitting: Physician Assistant

## 2012-12-14 ENCOUNTER — Encounter (INDEPENDENT_AMBULATORY_CARE_PROVIDER_SITE_OTHER): Payer: Self-pay

## 2012-12-14 VITALS — BP 116/62 | Temp 99.0°F | Resp 18 | Ht 69.0 in | Wt 210.0 lb

## 2012-12-14 DIAGNOSIS — Z4651 Encounter for fitting and adjustment of gastric lap band: Secondary | ICD-10-CM

## 2012-12-14 NOTE — Progress Notes (Signed)
  HISTORY: Colleen Schmitt is a 42 y.o.female who received an AP-Standard lap-band in February 2008 by Dr. Ezzard Standing. She comes in with almost 7 lbs weight gain since her last visit in October when 0.2 mL was removed due to reflux symptoms. She reports being more hungry than normal, even with overnight hunger. She denies any further reflux symptoms or obstruction.  VITAL SIGNS: Filed Vitals:   12/14/12 0848  BP: 116/62  Temp: 99 F (37.2 C)  Resp: 18    PHYSICAL EXAM: Physical exam reveals a very well-appearing 42 y.o.female in no apparent distress Neurologic: Awake, alert, oriented Psych: Bright affect, conversant Respiratory: Breathing even and unlabored. No stridor or wheezing Abdomen: Soft, nontender, nondistended to palpation. Incisions well-healed. No incisional hernias. Port easily palpated. Extremities: Atraumatic, good range of motion.  ASSESMENT: 42 y.o.  female  s/p AP-Standard lap-band.   PLAN: The patient's port was accessed with a 20G Huber needle without difficulty. Clear fluid was aspirated and 0.1 mL saline was added to the port to give a total predicted volume of 3 mL. She may be sensitive to very small adjustments.The patient was able to swallow water without difficulty following the procedure and was instructed to take clear liquids for the next 24-48 hours and advance slowly as tolerated. I'll have her back in three months.

## 2012-12-14 NOTE — Patient Instructions (Signed)

## 2013-01-25 ENCOUNTER — Encounter (INDEPENDENT_AMBULATORY_CARE_PROVIDER_SITE_OTHER): Payer: Commercial Managed Care - PPO

## 2013-02-15 ENCOUNTER — Encounter (INDEPENDENT_AMBULATORY_CARE_PROVIDER_SITE_OTHER): Payer: Commercial Managed Care - PPO

## 2013-03-01 ENCOUNTER — Ambulatory Visit (INDEPENDENT_AMBULATORY_CARE_PROVIDER_SITE_OTHER): Payer: Commercial Managed Care - PPO | Admitting: Physician Assistant

## 2013-03-01 ENCOUNTER — Encounter (INDEPENDENT_AMBULATORY_CARE_PROVIDER_SITE_OTHER): Payer: Self-pay

## 2013-03-01 VITALS — BP 118/68 | HR 80 | Temp 98.2°F | Resp 14 | Ht 69.0 in | Wt 203.6 lb

## 2013-03-01 DIAGNOSIS — Z4651 Encounter for fitting and adjustment of gastric lap band: Secondary | ICD-10-CM

## 2013-03-01 NOTE — Patient Instructions (Signed)

## 2013-03-01 NOTE — Progress Notes (Signed)
  HISTORY: Colleen Schmitt is a 43 y.o.female who received an AP-Standard lap-band in February 2008 by Dr. Lucia Gaskins. She has lost 6.4 lbs since her last visit in December 2014 and 37.4 lbs since her surgery. She has no complaint of reflux or regurgitation but she does report an increase in portion sizes and overall hunger recently. She continues to go to the gym twice a week and tries to walk one mile around her neighborhood on the remaining days, but the recent icy conditions have precluded this somewhat. She wants a fill today to get her portions under better control but she's concerned about recurrent reflux which she's battled in the past. She reports eating a filet with crab legs and baked potato a few days ago.  VITAL SIGNS: Filed Vitals:   03/01/13 0825  BP: 118/68  Pulse: 80  Temp: 98.2 F (36.8 C)  Resp: 14    PHYSICAL EXAM: Physical exam reveals a very well-appearing 43 y.o.female in no apparent distress Neurologic: Awake, alert, oriented Psych: Bright affect, conversant Respiratory: Breathing even and unlabored. No stridor or wheezing Abdomen: Soft, nontender, nondistended to palpation. Incisions well-healed. No incisional hernias. Port easily palpated. Extremities: Atraumatic, good range of motion.  ASSESMENT: 43 y.o.  female  s/p AP-Standard lap-band.   PLAN: The patient's port was accessed with a 20G Huber needle without difficulty. Clear fluid was aspirated and 0.1 mL saline was added to the port to give a total predicted volume of 3.1 mL. The patient was able to swallow water without difficulty following the procedure and was instructed to take clear liquids for the next 24-48 hours and advance slowly as tolerated. I urged her to call us immediately should she have any reflux symptoms. We'll have her back in three months.

## 2013-04-26 ENCOUNTER — Encounter (INDEPENDENT_AMBULATORY_CARE_PROVIDER_SITE_OTHER): Payer: Commercial Managed Care - PPO

## 2013-05-03 ENCOUNTER — Encounter (INDEPENDENT_AMBULATORY_CARE_PROVIDER_SITE_OTHER): Payer: Self-pay

## 2013-05-03 ENCOUNTER — Ambulatory Visit (INDEPENDENT_AMBULATORY_CARE_PROVIDER_SITE_OTHER): Payer: Commercial Managed Care - PPO | Admitting: Physician Assistant

## 2013-05-03 VITALS — BP 120/80 | HR 64 | Temp 98.6°F | Resp 14 | Ht 69.0 in | Wt 209.4 lb

## 2013-05-03 DIAGNOSIS — Z4651 Encounter for fitting and adjustment of gastric lap band: Secondary | ICD-10-CM

## 2013-05-03 NOTE — Patient Instructions (Signed)

## 2013-05-03 NOTE — Progress Notes (Signed)
  HISTORY: Colleen Schmitt is a 43 y.o.female who received an AP-Standard lap-band in February 2008 by Dr. Lucia Gaskins. She comes in today with 6 lbs weight gain since her last visit. She describes feeling very little change with the last fill. It was 0.1 mL, primarily because she is very sensitive to fills. She complains of eating no less than before and weight gain. Fortunately she has no regurgitation or reflux symptoms today.  VITAL SIGNS: Filed Vitals:   05/03/13 1517  BP: 120/80  Pulse: 64  Temp: 98.6 F (37 C)  Resp: 14    PHYSICAL EXAM: Physical exam reveals a very well-appearing 44 y.o.female in no apparent distress Neurologic: Awake, alert, oriented Psych: Bright affect, conversant Respiratory: Breathing even and unlabored. No stridor or wheezing Abdomen: Soft, nontender, nondistended to palpation. Incisions well-healed. No incisional hernias. Port easily palpated. Extremities: Atraumatic, good range of motion.  ASSESMENT: 43 y.o.  female  s/p AP-Standard lap-band.   PLAN: The patient's port was accessed with a 20G Huber needle without difficulty. Clear fluid was aspirated and 0.2 mL saline was added to the port to give a total predicted volume of 3.3 mL. The patient was able to swallow water without difficulty following the procedure and was instructed to take clear liquids for the next 24-48 hours and advance slowly as tolerated.

## 2013-05-31 ENCOUNTER — Encounter (INDEPENDENT_AMBULATORY_CARE_PROVIDER_SITE_OTHER): Payer: Commercial Managed Care - PPO

## 2014-08-22 ENCOUNTER — Other Ambulatory Visit: Payer: Self-pay

## 2014-08-22 DIAGNOSIS — Z9884 Bariatric surgery status: Secondary | ICD-10-CM

## 2014-08-22 DIAGNOSIS — R059 Cough, unspecified: Secondary | ICD-10-CM

## 2014-08-22 DIAGNOSIS — R109 Unspecified abdominal pain: Secondary | ICD-10-CM

## 2014-08-22 DIAGNOSIS — R05 Cough: Secondary | ICD-10-CM

## 2014-08-30 ENCOUNTER — Ambulatory Visit
Admission: RE | Admit: 2014-08-30 | Discharge: 2014-08-30 | Disposition: A | Discharge status: Home / Self Care | Payer: 59 | Source: Ambulatory Visit | Attending: Physician Assistant | Admitting: Physician Assistant

## 2014-09-17 ENCOUNTER — Ambulatory Visit: Payer: 59 | Admitting: Infectious Disease

## 2015-01-14 DIAGNOSIS — D509 Iron deficiency anemia, unspecified: Secondary | ICD-10-CM | POA: Diagnosis not present

## 2015-01-14 DIAGNOSIS — Z0000 Encounter for general adult medical examination without abnormal findings: Secondary | ICD-10-CM | POA: Diagnosis not present

## 2015-01-14 DIAGNOSIS — R5383 Other fatigue: Secondary | ICD-10-CM | POA: Diagnosis not present

## 2015-01-15 DIAGNOSIS — J302 Other seasonal allergic rhinitis: Secondary | ICD-10-CM | POA: Diagnosis not present

## 2015-01-15 DIAGNOSIS — F321 Major depressive disorder, single episode, moderate: Secondary | ICD-10-CM | POA: Diagnosis not present

## 2015-01-15 DIAGNOSIS — E668 Other obesity: Secondary | ICD-10-CM | POA: Diagnosis not present

## 2015-01-15 DIAGNOSIS — Z793 Long term (current) use of hormonal contraceptives: Secondary | ICD-10-CM | POA: Diagnosis not present

## 2015-01-15 DIAGNOSIS — Z9884 Bariatric surgery status: Secondary | ICD-10-CM | POA: Diagnosis not present

## 2015-01-15 DIAGNOSIS — K219 Gastro-esophageal reflux disease without esophagitis: Secondary | ICD-10-CM | POA: Diagnosis not present

## 2015-01-15 DIAGNOSIS — J4530 Mild persistent asthma, uncomplicated: Secondary | ICD-10-CM | POA: Diagnosis not present

## 2015-01-15 DIAGNOSIS — D509 Iron deficiency anemia, unspecified: Secondary | ICD-10-CM | POA: Diagnosis not present

## 2015-01-15 DIAGNOSIS — Z6832 Body mass index (BMI) 32.0-32.9, adult: Secondary | ICD-10-CM | POA: Diagnosis not present

## 2015-01-17 DIAGNOSIS — J3089 Other allergic rhinitis: Secondary | ICD-10-CM | POA: Diagnosis not present

## 2015-01-17 DIAGNOSIS — J3081 Allergic rhinitis due to animal (cat) (dog) hair and dander: Secondary | ICD-10-CM | POA: Diagnosis not present

## 2015-01-17 DIAGNOSIS — J301 Allergic rhinitis due to pollen: Secondary | ICD-10-CM | POA: Diagnosis not present

## 2015-01-22 DIAGNOSIS — F419 Anxiety disorder, unspecified: Secondary | ICD-10-CM | POA: Diagnosis not present

## 2015-01-23 DIAGNOSIS — J301 Allergic rhinitis due to pollen: Secondary | ICD-10-CM | POA: Diagnosis not present

## 2015-01-23 DIAGNOSIS — J3081 Allergic rhinitis due to animal (cat) (dog) hair and dander: Secondary | ICD-10-CM | POA: Diagnosis not present

## 2015-01-23 DIAGNOSIS — J3089 Other allergic rhinitis: Secondary | ICD-10-CM | POA: Diagnosis not present

## 2015-01-24 DIAGNOSIS — J3081 Allergic rhinitis due to animal (cat) (dog) hair and dander: Secondary | ICD-10-CM | POA: Diagnosis not present

## 2015-01-24 DIAGNOSIS — J3089 Other allergic rhinitis: Secondary | ICD-10-CM | POA: Diagnosis not present

## 2015-01-29 DIAGNOSIS — J3081 Allergic rhinitis due to animal (cat) (dog) hair and dander: Secondary | ICD-10-CM | POA: Diagnosis not present

## 2015-01-29 DIAGNOSIS — J3089 Other allergic rhinitis: Secondary | ICD-10-CM | POA: Diagnosis not present

## 2015-01-29 DIAGNOSIS — J301 Allergic rhinitis due to pollen: Secondary | ICD-10-CM | POA: Diagnosis not present

## 2015-02-04 DIAGNOSIS — F419 Anxiety disorder, unspecified: Secondary | ICD-10-CM | POA: Diagnosis not present

## 2015-02-05 DIAGNOSIS — J3089 Other allergic rhinitis: Secondary | ICD-10-CM | POA: Diagnosis not present

## 2015-02-05 DIAGNOSIS — J301 Allergic rhinitis due to pollen: Secondary | ICD-10-CM | POA: Diagnosis not present

## 2015-02-05 DIAGNOSIS — J3081 Allergic rhinitis due to animal (cat) (dog) hair and dander: Secondary | ICD-10-CM | POA: Diagnosis not present

## 2015-02-13 DIAGNOSIS — J3081 Allergic rhinitis due to animal (cat) (dog) hair and dander: Secondary | ICD-10-CM | POA: Diagnosis not present

## 2015-02-13 DIAGNOSIS — J3089 Other allergic rhinitis: Secondary | ICD-10-CM | POA: Diagnosis not present

## 2015-02-13 DIAGNOSIS — J301 Allergic rhinitis due to pollen: Secondary | ICD-10-CM | POA: Diagnosis not present

## 2015-02-13 MED FILL — DOXYCYCLINE HYCLATE 100 MG: 100 | 30 days supply | Qty: 60 | Fill #1

## 2015-02-13 MED FILL — DESLORATADINE 5 MG TABLET: 5 | 30 days supply | Qty: 30 | Fill #0

## 2015-02-13 MED FILL — MONTELUKAST SOD 10 MG TAB: 10 | 30 days supply | Qty: 30 | Fill #0

## 2015-02-18 DIAGNOSIS — J301 Allergic rhinitis due to pollen: Secondary | ICD-10-CM | POA: Diagnosis not present

## 2015-02-18 DIAGNOSIS — J3089 Other allergic rhinitis: Secondary | ICD-10-CM | POA: Diagnosis not present

## 2015-02-18 DIAGNOSIS — J3081 Allergic rhinitis due to animal (cat) (dog) hair and dander: Secondary | ICD-10-CM | POA: Diagnosis not present

## 2015-02-19 DIAGNOSIS — F419 Anxiety disorder, unspecified: Secondary | ICD-10-CM | POA: Diagnosis not present

## 2015-02-21 DIAGNOSIS — J3089 Other allergic rhinitis: Secondary | ICD-10-CM | POA: Diagnosis not present

## 2015-02-21 DIAGNOSIS — J3081 Allergic rhinitis due to animal (cat) (dog) hair and dander: Secondary | ICD-10-CM | POA: Diagnosis not present

## 2015-02-27 DIAGNOSIS — J3089 Other allergic rhinitis: Secondary | ICD-10-CM | POA: Diagnosis not present

## 2015-02-27 DIAGNOSIS — J3081 Allergic rhinitis due to animal (cat) (dog) hair and dander: Secondary | ICD-10-CM | POA: Diagnosis not present

## 2015-02-27 DIAGNOSIS — J301 Allergic rhinitis due to pollen: Secondary | ICD-10-CM | POA: Diagnosis not present

## 2015-03-05 MED FILL — VIIBRYD 40 MG TABLET: 40 | 90 days supply | Qty: 90 | Fill #0

## 2015-03-06 DIAGNOSIS — J301 Allergic rhinitis due to pollen: Secondary | ICD-10-CM | POA: Diagnosis not present

## 2015-03-06 DIAGNOSIS — J3089 Other allergic rhinitis: Secondary | ICD-10-CM | POA: Diagnosis not present

## 2015-03-06 DIAGNOSIS — J3081 Allergic rhinitis due to animal (cat) (dog) hair and dander: Secondary | ICD-10-CM | POA: Diagnosis not present

## 2015-03-07 MED FILL — MONTELUKAST SOD 10 MG TAB: 10 | 90 days supply | Qty: 90 | Fill #1

## 2015-03-07 MED FILL — DESLORATADINE 5 MG TABLET: 5 | 90 days supply | Qty: 90 | Fill #1

## 2015-03-11 DIAGNOSIS — F419 Anxiety disorder, unspecified: Secondary | ICD-10-CM | POA: Diagnosis not present

## 2015-03-12 DIAGNOSIS — J3081 Allergic rhinitis due to animal (cat) (dog) hair and dander: Secondary | ICD-10-CM | POA: Diagnosis not present

## 2015-03-12 DIAGNOSIS — J3089 Other allergic rhinitis: Secondary | ICD-10-CM | POA: Diagnosis not present

## 2015-03-12 DIAGNOSIS — J301 Allergic rhinitis due to pollen: Secondary | ICD-10-CM | POA: Diagnosis not present

## 2015-03-17 DIAGNOSIS — J3081 Allergic rhinitis due to animal (cat) (dog) hair and dander: Secondary | ICD-10-CM | POA: Diagnosis not present

## 2015-03-17 DIAGNOSIS — J3089 Other allergic rhinitis: Secondary | ICD-10-CM | POA: Diagnosis not present

## 2015-03-17 DIAGNOSIS — J301 Allergic rhinitis due to pollen: Secondary | ICD-10-CM | POA: Diagnosis not present

## 2015-04-01 DIAGNOSIS — J3089 Other allergic rhinitis: Secondary | ICD-10-CM | POA: Diagnosis not present

## 2015-04-01 DIAGNOSIS — J3081 Allergic rhinitis due to animal (cat) (dog) hair and dander: Secondary | ICD-10-CM | POA: Diagnosis not present

## 2015-04-01 DIAGNOSIS — J301 Allergic rhinitis due to pollen: Secondary | ICD-10-CM | POA: Diagnosis not present

## 2015-04-01 MED FILL — DEXILANT DR 60 MG CAPSULE: 60 | 90 days supply | Qty: 90 | Fill #0

## 2015-04-02 DIAGNOSIS — F419 Anxiety disorder, unspecified: Secondary | ICD-10-CM | POA: Diagnosis not present

## 2015-04-07 DIAGNOSIS — J3081 Allergic rhinitis due to animal (cat) (dog) hair and dander: Secondary | ICD-10-CM | POA: Diagnosis not present

## 2015-04-07 DIAGNOSIS — J3089 Other allergic rhinitis: Secondary | ICD-10-CM | POA: Diagnosis not present

## 2015-04-07 DIAGNOSIS — J301 Allergic rhinitis due to pollen: Secondary | ICD-10-CM | POA: Diagnosis not present

## 2015-04-18 DIAGNOSIS — J301 Allergic rhinitis due to pollen: Secondary | ICD-10-CM | POA: Diagnosis not present

## 2015-04-18 DIAGNOSIS — J3081 Allergic rhinitis due to animal (cat) (dog) hair and dander: Secondary | ICD-10-CM | POA: Diagnosis not present

## 2015-04-18 DIAGNOSIS — J3089 Other allergic rhinitis: Secondary | ICD-10-CM | POA: Diagnosis not present

## 2015-04-18 MED FILL — DOXYCYCLINE HYCLATE 100 MG: 100 | 90 days supply | Qty: 180 | Fill #0 | Status: TO

## 2015-04-21 DIAGNOSIS — F419 Anxiety disorder, unspecified: Secondary | ICD-10-CM | POA: Diagnosis not present

## 2015-04-22 DIAGNOSIS — J3081 Allergic rhinitis due to animal (cat) (dog) hair and dander: Secondary | ICD-10-CM | POA: Diagnosis not present

## 2015-04-22 DIAGNOSIS — J301 Allergic rhinitis due to pollen: Secondary | ICD-10-CM | POA: Diagnosis not present

## 2015-04-22 DIAGNOSIS — J3089 Other allergic rhinitis: Secondary | ICD-10-CM | POA: Diagnosis not present

## 2015-04-29 DIAGNOSIS — J301 Allergic rhinitis due to pollen: Secondary | ICD-10-CM | POA: Diagnosis not present

## 2015-04-29 DIAGNOSIS — J3081 Allergic rhinitis due to animal (cat) (dog) hair and dander: Secondary | ICD-10-CM | POA: Diagnosis not present

## 2015-04-29 DIAGNOSIS — J3089 Other allergic rhinitis: Secondary | ICD-10-CM | POA: Diagnosis not present

## 2015-05-05 DIAGNOSIS — J301 Allergic rhinitis due to pollen: Secondary | ICD-10-CM | POA: Diagnosis not present

## 2015-05-05 DIAGNOSIS — J3089 Other allergic rhinitis: Secondary | ICD-10-CM | POA: Diagnosis not present

## 2015-05-05 DIAGNOSIS — J3081 Allergic rhinitis due to animal (cat) (dog) hair and dander: Secondary | ICD-10-CM | POA: Diagnosis not present

## 2015-05-14 DIAGNOSIS — F419 Anxiety disorder, unspecified: Secondary | ICD-10-CM | POA: Diagnosis not present

## 2015-05-16 DIAGNOSIS — J3081 Allergic rhinitis due to animal (cat) (dog) hair and dander: Secondary | ICD-10-CM | POA: Diagnosis not present

## 2015-05-16 DIAGNOSIS — J301 Allergic rhinitis due to pollen: Secondary | ICD-10-CM | POA: Diagnosis not present

## 2015-05-16 DIAGNOSIS — J3089 Other allergic rhinitis: Secondary | ICD-10-CM | POA: Diagnosis not present

## 2015-05-20 DIAGNOSIS — J3089 Other allergic rhinitis: Secondary | ICD-10-CM | POA: Diagnosis not present

## 2015-05-20 DIAGNOSIS — J301 Allergic rhinitis due to pollen: Secondary | ICD-10-CM | POA: Diagnosis not present

## 2015-05-20 DIAGNOSIS — J3081 Allergic rhinitis due to animal (cat) (dog) hair and dander: Secondary | ICD-10-CM | POA: Diagnosis not present

## 2015-05-28 DIAGNOSIS — F419 Anxiety disorder, unspecified: Secondary | ICD-10-CM | POA: Diagnosis not present

## 2015-05-30 DIAGNOSIS — J301 Allergic rhinitis due to pollen: Secondary | ICD-10-CM | POA: Diagnosis not present

## 2015-05-30 DIAGNOSIS — J3081 Allergic rhinitis due to animal (cat) (dog) hair and dander: Secondary | ICD-10-CM | POA: Diagnosis not present

## 2015-05-30 DIAGNOSIS — J3089 Other allergic rhinitis: Secondary | ICD-10-CM | POA: Diagnosis not present

## 2015-06-06 DIAGNOSIS — J3089 Other allergic rhinitis: Secondary | ICD-10-CM | POA: Diagnosis not present

## 2015-06-06 DIAGNOSIS — J3081 Allergic rhinitis due to animal (cat) (dog) hair and dander: Secondary | ICD-10-CM | POA: Diagnosis not present

## 2015-06-06 DIAGNOSIS — J301 Allergic rhinitis due to pollen: Secondary | ICD-10-CM | POA: Diagnosis not present

## 2015-06-11 DIAGNOSIS — F419 Anxiety disorder, unspecified: Secondary | ICD-10-CM | POA: Diagnosis not present

## 2015-06-11 DIAGNOSIS — J301 Allergic rhinitis due to pollen: Secondary | ICD-10-CM | POA: Diagnosis not present

## 2015-06-11 DIAGNOSIS — J3081 Allergic rhinitis due to animal (cat) (dog) hair and dander: Secondary | ICD-10-CM | POA: Diagnosis not present

## 2015-06-11 DIAGNOSIS — J3089 Other allergic rhinitis: Secondary | ICD-10-CM | POA: Diagnosis not present

## 2015-06-11 MED FILL — DESLORATADINE 5 MG TABLET: 5 | 90 days supply | Qty: 90 | Fill #0

## 2015-06-17 DIAGNOSIS — J301 Allergic rhinitis due to pollen: Secondary | ICD-10-CM | POA: Diagnosis not present

## 2015-06-17 DIAGNOSIS — J3089 Other allergic rhinitis: Secondary | ICD-10-CM | POA: Diagnosis not present

## 2015-06-17 DIAGNOSIS — J3081 Allergic rhinitis due to animal (cat) (dog) hair and dander: Secondary | ICD-10-CM | POA: Diagnosis not present

## 2015-06-18 MED FILL — MONTELUKAST SOD 10 MG TAB: 10 | 60 days supply | Qty: 60 | Fill #2

## 2015-06-20 DIAGNOSIS — J3089 Other allergic rhinitis: Secondary | ICD-10-CM | POA: Diagnosis not present

## 2015-06-20 DIAGNOSIS — J3081 Allergic rhinitis due to animal (cat) (dog) hair and dander: Secondary | ICD-10-CM | POA: Diagnosis not present

## 2015-06-20 DIAGNOSIS — J301 Allergic rhinitis due to pollen: Secondary | ICD-10-CM | POA: Diagnosis not present

## 2015-06-23 DIAGNOSIS — G5603 Carpal tunnel syndrome, bilateral upper limbs: Secondary | ICD-10-CM | POA: Diagnosis not present

## 2015-06-23 DIAGNOSIS — M79642 Pain in left hand: Secondary | ICD-10-CM | POA: Diagnosis not present

## 2015-06-23 DIAGNOSIS — M79641 Pain in right hand: Secondary | ICD-10-CM | POA: Diagnosis not present

## 2015-06-24 DIAGNOSIS — J3089 Other allergic rhinitis: Secondary | ICD-10-CM | POA: Diagnosis not present

## 2015-06-24 DIAGNOSIS — J3081 Allergic rhinitis due to animal (cat) (dog) hair and dander: Secondary | ICD-10-CM | POA: Diagnosis not present

## 2015-06-24 DIAGNOSIS — J301 Allergic rhinitis due to pollen: Secondary | ICD-10-CM | POA: Diagnosis not present

## 2015-06-27 DIAGNOSIS — J301 Allergic rhinitis due to pollen: Secondary | ICD-10-CM | POA: Diagnosis not present

## 2015-06-28 DIAGNOSIS — M79642 Pain in left hand: Secondary | ICD-10-CM | POA: Diagnosis not present

## 2015-06-28 DIAGNOSIS — M79641 Pain in right hand: Secondary | ICD-10-CM | POA: Diagnosis not present

## 2015-07-01 MED FILL — DEXILANT DR 60 MG CAPSULE: 60 | 90 days supply | Qty: 90 | Fill #1

## 2015-07-02 DIAGNOSIS — Z0000 Encounter for general adult medical examination without abnormal findings: Secondary | ICD-10-CM | POA: Diagnosis not present

## 2015-07-02 MED FILL — metroNIDAZOLE 0.75 % GEL: 0.75 | 30 days supply | Qty: 45 | Fill #0

## 2015-07-04 DIAGNOSIS — J301 Allergic rhinitis due to pollen: Secondary | ICD-10-CM | POA: Diagnosis not present

## 2015-07-04 DIAGNOSIS — J3081 Allergic rhinitis due to animal (cat) (dog) hair and dander: Secondary | ICD-10-CM | POA: Diagnosis not present

## 2015-07-04 DIAGNOSIS — J3089 Other allergic rhinitis: Secondary | ICD-10-CM | POA: Diagnosis not present

## 2015-07-07 DIAGNOSIS — G5603 Carpal tunnel syndrome, bilateral upper limbs: Secondary | ICD-10-CM | POA: Diagnosis not present

## 2015-07-07 DIAGNOSIS — G5602 Carpal tunnel syndrome, left upper limb: Secondary | ICD-10-CM | POA: Diagnosis not present

## 2015-07-07 DIAGNOSIS — G5601 Carpal tunnel syndrome, right upper limb: Secondary | ICD-10-CM | POA: Diagnosis not present

## 2015-07-09 DIAGNOSIS — J4530 Mild persistent asthma, uncomplicated: Secondary | ICD-10-CM | POA: Diagnosis not present

## 2015-07-09 DIAGNOSIS — K219 Gastro-esophageal reflux disease without esophagitis: Secondary | ICD-10-CM | POA: Diagnosis not present

## 2015-07-09 DIAGNOSIS — J302 Other seasonal allergic rhinitis: Secondary | ICD-10-CM | POA: Diagnosis not present

## 2015-07-09 DIAGNOSIS — F321 Major depressive disorder, single episode, moderate: Secondary | ICD-10-CM | POA: Diagnosis not present

## 2015-07-09 DIAGNOSIS — F419 Anxiety disorder, unspecified: Secondary | ICD-10-CM | POA: Diagnosis not present

## 2015-07-09 DIAGNOSIS — Z9884 Bariatric surgery status: Secondary | ICD-10-CM | POA: Diagnosis not present

## 2015-07-09 DIAGNOSIS — Z793 Long term (current) use of hormonal contraceptives: Secondary | ICD-10-CM | POA: Diagnosis not present

## 2015-07-09 DIAGNOSIS — E669 Obesity, unspecified: Secondary | ICD-10-CM | POA: Diagnosis not present

## 2015-07-09 DIAGNOSIS — Z6831 Body mass index (BMI) 31.0-31.9, adult: Secondary | ICD-10-CM | POA: Diagnosis not present

## 2015-07-09 DIAGNOSIS — Z0000 Encounter for general adult medical examination without abnormal findings: Secondary | ICD-10-CM | POA: Diagnosis not present

## 2015-07-11 DIAGNOSIS — J3081 Allergic rhinitis due to animal (cat) (dog) hair and dander: Secondary | ICD-10-CM | POA: Diagnosis not present

## 2015-07-11 DIAGNOSIS — J301 Allergic rhinitis due to pollen: Secondary | ICD-10-CM | POA: Diagnosis not present

## 2015-07-11 DIAGNOSIS — J3089 Other allergic rhinitis: Secondary | ICD-10-CM | POA: Diagnosis not present

## 2015-07-18 DIAGNOSIS — J3089 Other allergic rhinitis: Secondary | ICD-10-CM | POA: Diagnosis not present

## 2015-07-18 DIAGNOSIS — J3081 Allergic rhinitis due to animal (cat) (dog) hair and dander: Secondary | ICD-10-CM | POA: Diagnosis not present

## 2015-07-18 DIAGNOSIS — J301 Allergic rhinitis due to pollen: Secondary | ICD-10-CM | POA: Diagnosis not present

## 2015-07-22 DIAGNOSIS — J3089 Other allergic rhinitis: Secondary | ICD-10-CM | POA: Diagnosis not present

## 2015-07-22 DIAGNOSIS — J3081 Allergic rhinitis due to animal (cat) (dog) hair and dander: Secondary | ICD-10-CM | POA: Diagnosis not present

## 2015-07-22 DIAGNOSIS — J301 Allergic rhinitis due to pollen: Secondary | ICD-10-CM | POA: Diagnosis not present

## 2015-07-23 DIAGNOSIS — F419 Anxiety disorder, unspecified: Secondary | ICD-10-CM | POA: Diagnosis not present

## 2015-08-01 DIAGNOSIS — J3081 Allergic rhinitis due to animal (cat) (dog) hair and dander: Secondary | ICD-10-CM | POA: Diagnosis not present

## 2015-08-01 DIAGNOSIS — J301 Allergic rhinitis due to pollen: Secondary | ICD-10-CM | POA: Diagnosis not present

## 2015-08-01 DIAGNOSIS — J3089 Other allergic rhinitis: Secondary | ICD-10-CM | POA: Diagnosis not present

## 2015-08-05 DIAGNOSIS — J301 Allergic rhinitis due to pollen: Secondary | ICD-10-CM | POA: Diagnosis not present

## 2015-08-05 DIAGNOSIS — J3089 Other allergic rhinitis: Secondary | ICD-10-CM | POA: Diagnosis not present

## 2015-08-05 DIAGNOSIS — J3081 Allergic rhinitis due to animal (cat) (dog) hair and dander: Secondary | ICD-10-CM | POA: Diagnosis not present

## 2015-08-06 DIAGNOSIS — F419 Anxiety disorder, unspecified: Secondary | ICD-10-CM | POA: Diagnosis not present

## 2015-08-13 DIAGNOSIS — J3081 Allergic rhinitis due to animal (cat) (dog) hair and dander: Secondary | ICD-10-CM | POA: Diagnosis not present

## 2015-08-13 DIAGNOSIS — J3089 Other allergic rhinitis: Secondary | ICD-10-CM | POA: Diagnosis not present

## 2015-08-13 DIAGNOSIS — J301 Allergic rhinitis due to pollen: Secondary | ICD-10-CM | POA: Diagnosis not present

## 2015-08-15 DIAGNOSIS — J3081 Allergic rhinitis due to animal (cat) (dog) hair and dander: Secondary | ICD-10-CM | POA: Diagnosis not present

## 2015-08-15 DIAGNOSIS — J3089 Other allergic rhinitis: Secondary | ICD-10-CM | POA: Diagnosis not present

## 2015-08-15 MED FILL — MONTELUKAST SOD 10 MG TAB: 10 | 90 days supply | Qty: 90 | Fill #0 | Status: TO

## 2015-08-19 DIAGNOSIS — J301 Allergic rhinitis due to pollen: Secondary | ICD-10-CM | POA: Diagnosis not present

## 2015-08-19 DIAGNOSIS — J3089 Other allergic rhinitis: Secondary | ICD-10-CM | POA: Diagnosis not present

## 2015-08-19 DIAGNOSIS — J3081 Allergic rhinitis due to animal (cat) (dog) hair and dander: Secondary | ICD-10-CM | POA: Diagnosis not present

## 2015-08-20 DIAGNOSIS — F419 Anxiety disorder, unspecified: Secondary | ICD-10-CM | POA: Diagnosis not present

## 2015-08-25 DIAGNOSIS — J3089 Other allergic rhinitis: Secondary | ICD-10-CM | POA: Diagnosis not present

## 2015-08-25 DIAGNOSIS — J301 Allergic rhinitis due to pollen: Secondary | ICD-10-CM | POA: Diagnosis not present

## 2015-08-25 DIAGNOSIS — J3081 Allergic rhinitis due to animal (cat) (dog) hair and dander: Secondary | ICD-10-CM | POA: Diagnosis not present

## 2015-08-28 DIAGNOSIS — J301 Allergic rhinitis due to pollen: Secondary | ICD-10-CM | POA: Diagnosis not present

## 2015-09-02 MED FILL — DESLORATADINE 5 MG TABLET: 5 | 30 days supply | Qty: 30 | Fill #0

## 2015-09-03 DIAGNOSIS — F419 Anxiety disorder, unspecified: Secondary | ICD-10-CM | POA: Diagnosis not present

## 2015-09-03 MED FILL — valACYclovir HCL 1 GM TABS: 1 | 2 days supply | Qty: 4 | Fill #0

## 2015-09-05 DIAGNOSIS — J301 Allergic rhinitis due to pollen: Secondary | ICD-10-CM | POA: Diagnosis not present

## 2015-09-05 DIAGNOSIS — J3089 Other allergic rhinitis: Secondary | ICD-10-CM | POA: Diagnosis not present

## 2015-09-05 DIAGNOSIS — J3081 Allergic rhinitis due to animal (cat) (dog) hair and dander: Secondary | ICD-10-CM | POA: Diagnosis not present

## 2015-09-08 DIAGNOSIS — G5601 Carpal tunnel syndrome, right upper limb: Secondary | ICD-10-CM | POA: Diagnosis not present

## 2015-09-08 DIAGNOSIS — G5603 Carpal tunnel syndrome, bilateral upper limbs: Secondary | ICD-10-CM | POA: Diagnosis not present

## 2015-09-12 DIAGNOSIS — J3089 Other allergic rhinitis: Secondary | ICD-10-CM | POA: Diagnosis not present

## 2015-09-12 DIAGNOSIS — J301 Allergic rhinitis due to pollen: Secondary | ICD-10-CM | POA: Diagnosis not present

## 2015-09-12 DIAGNOSIS — J3081 Allergic rhinitis due to animal (cat) (dog) hair and dander: Secondary | ICD-10-CM | POA: Diagnosis not present

## 2015-09-17 DIAGNOSIS — Z01419 Encounter for gynecological examination (general) (routine) without abnormal findings: Secondary | ICD-10-CM | POA: Diagnosis not present

## 2015-09-17 DIAGNOSIS — J3089 Other allergic rhinitis: Secondary | ICD-10-CM | POA: Diagnosis not present

## 2015-09-17 DIAGNOSIS — Z6831 Body mass index (BMI) 31.0-31.9, adult: Secondary | ICD-10-CM | POA: Diagnosis not present

## 2015-09-17 DIAGNOSIS — J301 Allergic rhinitis due to pollen: Secondary | ICD-10-CM | POA: Diagnosis not present

## 2015-09-17 DIAGNOSIS — Z1231 Encounter for screening mammogram for malignant neoplasm of breast: Secondary | ICD-10-CM | POA: Diagnosis not present

## 2015-09-17 DIAGNOSIS — J3081 Allergic rhinitis due to animal (cat) (dog) hair and dander: Secondary | ICD-10-CM | POA: Diagnosis not present

## 2015-09-17 MED FILL — CLOBETASOL 0.05% OINTMENT: 0.05 | 14 days supply | Qty: 30 | Fill #0

## 2015-09-19 DIAGNOSIS — J301 Allergic rhinitis due to pollen: Secondary | ICD-10-CM | POA: Diagnosis not present

## 2015-09-19 DIAGNOSIS — J3081 Allergic rhinitis due to animal (cat) (dog) hair and dander: Secondary | ICD-10-CM | POA: Diagnosis not present

## 2015-09-19 DIAGNOSIS — J3089 Other allergic rhinitis: Secondary | ICD-10-CM | POA: Diagnosis not present

## 2015-09-22 MED FILL — DEXILANT DR 60 MG CAPSULE: 60 | 90 days supply | Qty: 90 | Fill #0

## 2015-09-23 DIAGNOSIS — F419 Anxiety disorder, unspecified: Secondary | ICD-10-CM | POA: Diagnosis not present

## 2015-09-23 DIAGNOSIS — J3081 Allergic rhinitis due to animal (cat) (dog) hair and dander: Secondary | ICD-10-CM | POA: Diagnosis not present

## 2015-09-23 DIAGNOSIS — J3089 Other allergic rhinitis: Secondary | ICD-10-CM | POA: Diagnosis not present

## 2015-09-23 DIAGNOSIS — J301 Allergic rhinitis due to pollen: Secondary | ICD-10-CM | POA: Diagnosis not present

## 2015-09-26 DIAGNOSIS — J3089 Other allergic rhinitis: Secondary | ICD-10-CM | POA: Diagnosis not present

## 2015-09-26 DIAGNOSIS — J3081 Allergic rhinitis due to animal (cat) (dog) hair and dander: Secondary | ICD-10-CM | POA: Diagnosis not present

## 2015-09-26 DIAGNOSIS — J301 Allergic rhinitis due to pollen: Secondary | ICD-10-CM | POA: Diagnosis not present

## 2015-10-01 DIAGNOSIS — F419 Anxiety disorder, unspecified: Secondary | ICD-10-CM | POA: Diagnosis not present

## 2015-10-03 DIAGNOSIS — J301 Allergic rhinitis due to pollen: Secondary | ICD-10-CM | POA: Diagnosis not present

## 2015-10-03 DIAGNOSIS — J3081 Allergic rhinitis due to animal (cat) (dog) hair and dander: Secondary | ICD-10-CM | POA: Diagnosis not present

## 2015-10-03 DIAGNOSIS — J3089 Other allergic rhinitis: Secondary | ICD-10-CM | POA: Diagnosis not present

## 2015-10-08 MED FILL — DOXYCYCLINE HYCLATE 100 MG: 100 | 30 days supply | Qty: 60 | Fill #0

## 2015-10-08 MED FILL — DESLORATADINE 5 MG TABLET: 5 | 30 days supply | Qty: 30 | Fill #1

## 2015-10-15 DIAGNOSIS — F419 Anxiety disorder, unspecified: Secondary | ICD-10-CM | POA: Diagnosis not present

## 2015-10-16 DIAGNOSIS — H52223 Regular astigmatism, bilateral: Secondary | ICD-10-CM | POA: Diagnosis not present

## 2015-10-16 DIAGNOSIS — H5213 Myopia, bilateral: Secondary | ICD-10-CM | POA: Diagnosis not present

## 2015-10-17 DIAGNOSIS — J301 Allergic rhinitis due to pollen: Secondary | ICD-10-CM | POA: Diagnosis not present

## 2015-10-17 DIAGNOSIS — J3089 Other allergic rhinitis: Secondary | ICD-10-CM | POA: Diagnosis not present

## 2015-10-17 DIAGNOSIS — J3081 Allergic rhinitis due to animal (cat) (dog) hair and dander: Secondary | ICD-10-CM | POA: Diagnosis not present

## 2015-10-24 DIAGNOSIS — J3089 Other allergic rhinitis: Secondary | ICD-10-CM | POA: Diagnosis not present

## 2015-10-24 DIAGNOSIS — J301 Allergic rhinitis due to pollen: Secondary | ICD-10-CM | POA: Diagnosis not present

## 2015-10-24 DIAGNOSIS — J3081 Allergic rhinitis due to animal (cat) (dog) hair and dander: Secondary | ICD-10-CM | POA: Diagnosis not present

## 2015-10-28 DIAGNOSIS — J3081 Allergic rhinitis due to animal (cat) (dog) hair and dander: Secondary | ICD-10-CM | POA: Diagnosis not present

## 2015-10-28 DIAGNOSIS — J3089 Other allergic rhinitis: Secondary | ICD-10-CM | POA: Diagnosis not present

## 2015-10-29 DIAGNOSIS — J3081 Allergic rhinitis due to animal (cat) (dog) hair and dander: Secondary | ICD-10-CM | POA: Diagnosis not present

## 2015-10-29 DIAGNOSIS — J3089 Other allergic rhinitis: Secondary | ICD-10-CM | POA: Diagnosis not present

## 2015-10-29 DIAGNOSIS — J301 Allergic rhinitis due to pollen: Secondary | ICD-10-CM | POA: Diagnosis not present

## 2015-10-29 DIAGNOSIS — F419 Anxiety disorder, unspecified: Secondary | ICD-10-CM | POA: Diagnosis not present

## 2015-10-31 DIAGNOSIS — J3089 Other allergic rhinitis: Secondary | ICD-10-CM | POA: Diagnosis not present

## 2015-10-31 DIAGNOSIS — J3081 Allergic rhinitis due to animal (cat) (dog) hair and dander: Secondary | ICD-10-CM | POA: Diagnosis not present

## 2015-10-31 DIAGNOSIS — J301 Allergic rhinitis due to pollen: Secondary | ICD-10-CM | POA: Diagnosis not present

## 2015-10-31 MED FILL — DESLORATADINE 5 MG TABLET: 5 | 60 days supply | Qty: 60 | Fill #0

## 2015-11-07 DIAGNOSIS — J3089 Other allergic rhinitis: Secondary | ICD-10-CM | POA: Diagnosis not present

## 2015-11-07 DIAGNOSIS — J301 Allergic rhinitis due to pollen: Secondary | ICD-10-CM | POA: Diagnosis not present

## 2015-11-07 DIAGNOSIS — J3081 Allergic rhinitis due to animal (cat) (dog) hair and dander: Secondary | ICD-10-CM | POA: Diagnosis not present

## 2015-11-11 DIAGNOSIS — F419 Anxiety disorder, unspecified: Secondary | ICD-10-CM | POA: Diagnosis not present

## 2015-11-12 DIAGNOSIS — J3081 Allergic rhinitis due to animal (cat) (dog) hair and dander: Secondary | ICD-10-CM | POA: Diagnosis not present

## 2015-11-12 DIAGNOSIS — J301 Allergic rhinitis due to pollen: Secondary | ICD-10-CM | POA: Diagnosis not present

## 2015-11-12 DIAGNOSIS — J3089 Other allergic rhinitis: Secondary | ICD-10-CM | POA: Diagnosis not present

## 2015-11-14 MED FILL — HYDROmorphone HCL 2 MG TABS: 2 | 5 days supply | Qty: 30 | Fill #0

## 2015-11-14 MED FILL — PROMETHAZINE 12.5 MG TABLET: 12.5 | 8 days supply | Qty: 30 | Fill #0

## 2015-11-14 MED FILL — ENOXAPARIN 40 MG/0.4 ML SYR: 40 | 12 days supply | Qty: 5 | Fill #0

## 2015-11-17 MED FILL — METHOCARBAMOL 500 MG TABLET: 500 | 10 days supply | Qty: 40 | Fill #0

## 2015-11-25 MED FILL — MONTELUKAST SOD 10 MG TAB: 10 | 30 days supply | Qty: 30 | Fill #0

## 2015-11-26 DIAGNOSIS — J3089 Other allergic rhinitis: Secondary | ICD-10-CM | POA: Diagnosis not present

## 2015-11-26 DIAGNOSIS — J3081 Allergic rhinitis due to animal (cat) (dog) hair and dander: Secondary | ICD-10-CM | POA: Diagnosis not present

## 2015-11-26 DIAGNOSIS — J301 Allergic rhinitis due to pollen: Secondary | ICD-10-CM | POA: Diagnosis not present

## 2015-12-01 DIAGNOSIS — F419 Anxiety disorder, unspecified: Secondary | ICD-10-CM | POA: Diagnosis not present

## 2015-12-02 DIAGNOSIS — J301 Allergic rhinitis due to pollen: Secondary | ICD-10-CM | POA: Diagnosis not present

## 2015-12-02 DIAGNOSIS — J3089 Other allergic rhinitis: Secondary | ICD-10-CM | POA: Diagnosis not present

## 2015-12-02 DIAGNOSIS — J3081 Allergic rhinitis due to animal (cat) (dog) hair and dander: Secondary | ICD-10-CM | POA: Diagnosis not present

## 2015-12-09 DIAGNOSIS — J3089 Other allergic rhinitis: Secondary | ICD-10-CM | POA: Diagnosis not present

## 2015-12-09 DIAGNOSIS — J3081 Allergic rhinitis due to animal (cat) (dog) hair and dander: Secondary | ICD-10-CM | POA: Diagnosis not present

## 2015-12-09 DIAGNOSIS — J301 Allergic rhinitis due to pollen: Secondary | ICD-10-CM | POA: Diagnosis not present

## 2015-12-10 DIAGNOSIS — F419 Anxiety disorder, unspecified: Secondary | ICD-10-CM | POA: Diagnosis not present

## 2015-12-11 DIAGNOSIS — M79641 Pain in right hand: Secondary | ICD-10-CM | POA: Diagnosis not present

## 2015-12-11 DIAGNOSIS — M79642 Pain in left hand: Secondary | ICD-10-CM | POA: Diagnosis not present

## 2015-12-11 DIAGNOSIS — G5601 Carpal tunnel syndrome, right upper limb: Secondary | ICD-10-CM | POA: Diagnosis not present

## 2015-12-11 DIAGNOSIS — G5603 Carpal tunnel syndrome, bilateral upper limbs: Secondary | ICD-10-CM | POA: Diagnosis not present

## 2015-12-16 DIAGNOSIS — J3089 Other allergic rhinitis: Secondary | ICD-10-CM | POA: Diagnosis not present

## 2015-12-16 DIAGNOSIS — J301 Allergic rhinitis due to pollen: Secondary | ICD-10-CM | POA: Diagnosis not present

## 2015-12-16 DIAGNOSIS — J3081 Allergic rhinitis due to animal (cat) (dog) hair and dander: Secondary | ICD-10-CM | POA: Diagnosis not present

## 2015-12-19 DIAGNOSIS — J3081 Allergic rhinitis due to animal (cat) (dog) hair and dander: Secondary | ICD-10-CM | POA: Diagnosis not present

## 2015-12-19 DIAGNOSIS — J301 Allergic rhinitis due to pollen: Secondary | ICD-10-CM | POA: Diagnosis not present

## 2015-12-19 DIAGNOSIS — J3089 Other allergic rhinitis: Secondary | ICD-10-CM | POA: Diagnosis not present

## 2015-12-19 DIAGNOSIS — J4520 Mild intermittent asthma, uncomplicated: Secondary | ICD-10-CM | POA: Diagnosis not present

## 2015-12-19 MED FILL — EPINEPHRINE 0.3 MG AUTO-INJ: 0.3 | 30 days supply | Qty: 2 | Fill #0

## 2015-12-22 MED FILL — MONTELUKAST SOD 10 MG TAB: 10 | 30 days supply | Qty: 30 | Fill #0

## 2015-12-22 MED FILL — DOXYCYCLINE HYCLATE 100 MG: 100 | 30 days supply | Qty: 60 | Fill #0 | Status: TO

## 2015-12-23 DIAGNOSIS — J301 Allergic rhinitis due to pollen: Secondary | ICD-10-CM | POA: Diagnosis not present

## 2015-12-23 DIAGNOSIS — J3081 Allergic rhinitis due to animal (cat) (dog) hair and dander: Secondary | ICD-10-CM | POA: Diagnosis not present

## 2015-12-23 DIAGNOSIS — J3089 Other allergic rhinitis: Secondary | ICD-10-CM | POA: Diagnosis not present

## 2015-12-31 DIAGNOSIS — J301 Allergic rhinitis due to pollen: Secondary | ICD-10-CM | POA: Diagnosis not present

## 2015-12-31 DIAGNOSIS — J3089 Other allergic rhinitis: Secondary | ICD-10-CM | POA: Diagnosis not present

## 2015-12-31 DIAGNOSIS — J3081 Allergic rhinitis due to animal (cat) (dog) hair and dander: Secondary | ICD-10-CM | POA: Diagnosis not present

## 2016-01-06 DIAGNOSIS — M9902 Segmental and somatic dysfunction of thoracic region: Secondary | ICD-10-CM | POA: Diagnosis not present

## 2016-01-06 DIAGNOSIS — M79671 Pain in right foot: Secondary | ICD-10-CM | POA: Diagnosis not present

## 2016-01-06 DIAGNOSIS — M5137 Other intervertebral disc degeneration, lumbosacral region: Secondary | ICD-10-CM | POA: Diagnosis not present

## 2016-01-06 DIAGNOSIS — M9901 Segmental and somatic dysfunction of cervical region: Secondary | ICD-10-CM | POA: Diagnosis not present

## 2016-01-06 DIAGNOSIS — M4003 Postural kyphosis, cervicothoracic region: Secondary | ICD-10-CM | POA: Diagnosis not present

## 2016-01-06 DIAGNOSIS — M9905 Segmental and somatic dysfunction of pelvic region: Secondary | ICD-10-CM | POA: Diagnosis not present

## 2016-01-06 DIAGNOSIS — M47812 Spondylosis without myelopathy or radiculopathy, cervical region: Secondary | ICD-10-CM | POA: Diagnosis not present

## 2016-01-06 DIAGNOSIS — M5032 Other cervical disc degeneration, mid-cervical region, unspecified level: Secondary | ICD-10-CM | POA: Diagnosis not present

## 2016-01-06 DIAGNOSIS — M9903 Segmental and somatic dysfunction of lumbar region: Secondary | ICD-10-CM | POA: Diagnosis not present

## 2016-01-06 MED FILL — DESLORATADINE 5 MG TABLET: 5 | 30 days supply | Qty: 30 | Fill #0 | Status: TO

## 2016-01-07 DIAGNOSIS — M9903 Segmental and somatic dysfunction of lumbar region: Secondary | ICD-10-CM | POA: Diagnosis not present

## 2016-01-07 DIAGNOSIS — M9902 Segmental and somatic dysfunction of thoracic region: Secondary | ICD-10-CM | POA: Diagnosis not present

## 2016-01-07 DIAGNOSIS — M5137 Other intervertebral disc degeneration, lumbosacral region: Secondary | ICD-10-CM | POA: Diagnosis not present

## 2016-01-07 DIAGNOSIS — M4003 Postural kyphosis, cervicothoracic region: Secondary | ICD-10-CM | POA: Diagnosis not present

## 2016-01-07 DIAGNOSIS — M5032 Other cervical disc degeneration, mid-cervical region, unspecified level: Secondary | ICD-10-CM | POA: Diagnosis not present

## 2016-01-07 DIAGNOSIS — M9901 Segmental and somatic dysfunction of cervical region: Secondary | ICD-10-CM | POA: Diagnosis not present

## 2016-01-07 DIAGNOSIS — M47812 Spondylosis without myelopathy or radiculopathy, cervical region: Secondary | ICD-10-CM | POA: Diagnosis not present

## 2016-01-07 DIAGNOSIS — M9905 Segmental and somatic dysfunction of pelvic region: Secondary | ICD-10-CM | POA: Diagnosis not present

## 2016-01-07 DIAGNOSIS — M79671 Pain in right foot: Secondary | ICD-10-CM | POA: Diagnosis not present

## 2016-01-08 DIAGNOSIS — M9902 Segmental and somatic dysfunction of thoracic region: Secondary | ICD-10-CM | POA: Diagnosis not present

## 2016-01-08 DIAGNOSIS — M79671 Pain in right foot: Secondary | ICD-10-CM | POA: Diagnosis not present

## 2016-01-08 DIAGNOSIS — M4003 Postural kyphosis, cervicothoracic region: Secondary | ICD-10-CM | POA: Diagnosis not present

## 2016-01-08 DIAGNOSIS — M9903 Segmental and somatic dysfunction of lumbar region: Secondary | ICD-10-CM | POA: Diagnosis not present

## 2016-01-08 DIAGNOSIS — M47812 Spondylosis without myelopathy or radiculopathy, cervical region: Secondary | ICD-10-CM | POA: Diagnosis not present

## 2016-01-08 DIAGNOSIS — M5032 Other cervical disc degeneration, mid-cervical region, unspecified level: Secondary | ICD-10-CM | POA: Diagnosis not present

## 2016-01-08 DIAGNOSIS — M5137 Other intervertebral disc degeneration, lumbosacral region: Secondary | ICD-10-CM | POA: Diagnosis not present

## 2016-01-08 DIAGNOSIS — M9901 Segmental and somatic dysfunction of cervical region: Secondary | ICD-10-CM | POA: Diagnosis not present

## 2016-01-08 DIAGNOSIS — M9905 Segmental and somatic dysfunction of pelvic region: Secondary | ICD-10-CM | POA: Diagnosis not present

## 2016-01-13 DIAGNOSIS — M9903 Segmental and somatic dysfunction of lumbar region: Secondary | ICD-10-CM | POA: Diagnosis not present

## 2016-01-13 DIAGNOSIS — M5032 Other cervical disc degeneration, mid-cervical region, unspecified level: Secondary | ICD-10-CM | POA: Diagnosis not present

## 2016-01-13 DIAGNOSIS — M9905 Segmental and somatic dysfunction of pelvic region: Secondary | ICD-10-CM | POA: Diagnosis not present

## 2016-01-13 DIAGNOSIS — M5137 Other intervertebral disc degeneration, lumbosacral region: Secondary | ICD-10-CM | POA: Diagnosis not present

## 2016-01-13 DIAGNOSIS — M9902 Segmental and somatic dysfunction of thoracic region: Secondary | ICD-10-CM | POA: Diagnosis not present

## 2016-01-13 DIAGNOSIS — M4003 Postural kyphosis, cervicothoracic region: Secondary | ICD-10-CM | POA: Diagnosis not present

## 2016-01-13 DIAGNOSIS — M79671 Pain in right foot: Secondary | ICD-10-CM | POA: Diagnosis not present

## 2016-01-13 DIAGNOSIS — M47812 Spondylosis without myelopathy or radiculopathy, cervical region: Secondary | ICD-10-CM | POA: Diagnosis not present

## 2016-01-13 DIAGNOSIS — M9901 Segmental and somatic dysfunction of cervical region: Secondary | ICD-10-CM | POA: Diagnosis not present

## 2016-01-15 DIAGNOSIS — M9903 Segmental and somatic dysfunction of lumbar region: Secondary | ICD-10-CM | POA: Diagnosis not present

## 2016-01-15 DIAGNOSIS — M9905 Segmental and somatic dysfunction of pelvic region: Secondary | ICD-10-CM | POA: Diagnosis not present

## 2016-01-15 DIAGNOSIS — M5032 Other cervical disc degeneration, mid-cervical region, unspecified level: Secondary | ICD-10-CM | POA: Diagnosis not present

## 2016-01-15 DIAGNOSIS — M47812 Spondylosis without myelopathy or radiculopathy, cervical region: Secondary | ICD-10-CM | POA: Diagnosis not present

## 2016-01-15 DIAGNOSIS — M4003 Postural kyphosis, cervicothoracic region: Secondary | ICD-10-CM | POA: Diagnosis not present

## 2016-01-15 DIAGNOSIS — M79671 Pain in right foot: Secondary | ICD-10-CM | POA: Diagnosis not present

## 2016-01-15 DIAGNOSIS — J3089 Other allergic rhinitis: Secondary | ICD-10-CM | POA: Diagnosis not present

## 2016-01-15 DIAGNOSIS — M9901 Segmental and somatic dysfunction of cervical region: Secondary | ICD-10-CM | POA: Diagnosis not present

## 2016-01-15 DIAGNOSIS — M9902 Segmental and somatic dysfunction of thoracic region: Secondary | ICD-10-CM | POA: Diagnosis not present

## 2016-01-15 DIAGNOSIS — J3081 Allergic rhinitis due to animal (cat) (dog) hair and dander: Secondary | ICD-10-CM | POA: Diagnosis not present

## 2016-01-15 DIAGNOSIS — J301 Allergic rhinitis due to pollen: Secondary | ICD-10-CM | POA: Diagnosis not present

## 2016-01-15 DIAGNOSIS — M5137 Other intervertebral disc degeneration, lumbosacral region: Secondary | ICD-10-CM | POA: Diagnosis not present

## 2016-01-19 DIAGNOSIS — M9902 Segmental and somatic dysfunction of thoracic region: Secondary | ICD-10-CM | POA: Diagnosis not present

## 2016-01-19 DIAGNOSIS — M9903 Segmental and somatic dysfunction of lumbar region: Secondary | ICD-10-CM | POA: Diagnosis not present

## 2016-01-19 DIAGNOSIS — M5137 Other intervertebral disc degeneration, lumbosacral region: Secondary | ICD-10-CM | POA: Diagnosis not present

## 2016-01-19 DIAGNOSIS — J3089 Other allergic rhinitis: Secondary | ICD-10-CM | POA: Diagnosis not present

## 2016-01-19 DIAGNOSIS — M79671 Pain in right foot: Secondary | ICD-10-CM | POA: Diagnosis not present

## 2016-01-19 DIAGNOSIS — J301 Allergic rhinitis due to pollen: Secondary | ICD-10-CM | POA: Diagnosis not present

## 2016-01-19 DIAGNOSIS — M47812 Spondylosis without myelopathy or radiculopathy, cervical region: Secondary | ICD-10-CM | POA: Diagnosis not present

## 2016-01-19 DIAGNOSIS — M4003 Postural kyphosis, cervicothoracic region: Secondary | ICD-10-CM | POA: Diagnosis not present

## 2016-01-19 DIAGNOSIS — M9905 Segmental and somatic dysfunction of pelvic region: Secondary | ICD-10-CM | POA: Diagnosis not present

## 2016-01-19 DIAGNOSIS — J3081 Allergic rhinitis due to animal (cat) (dog) hair and dander: Secondary | ICD-10-CM | POA: Diagnosis not present

## 2016-01-19 DIAGNOSIS — M9901 Segmental and somatic dysfunction of cervical region: Secondary | ICD-10-CM | POA: Diagnosis not present

## 2016-01-19 DIAGNOSIS — F329 Major depressive disorder, single episode, unspecified: Secondary | ICD-10-CM | POA: Diagnosis not present

## 2016-01-19 DIAGNOSIS — M5032 Other cervical disc degeneration, mid-cervical region, unspecified level: Secondary | ICD-10-CM | POA: Diagnosis not present

## 2016-01-27 MED FILL — DOXYCYCLINE HYCLATE 100 MG: 100 | 60 days supply | Qty: 120 | Fill #0

## 2016-01-27 MED FILL — MOMETASONE FUROATE 50 MCG S: 50 | 30 days supply | Qty: 17 | Fill #0

## 2016-01-27 MED FILL — MONTELUKAST SOD 10 MG TAB: 10 | 30 days supply | Qty: 30 | Fill #1

## 2016-01-30 DIAGNOSIS — M9903 Segmental and somatic dysfunction of lumbar region: Secondary | ICD-10-CM | POA: Diagnosis not present

## 2016-01-30 DIAGNOSIS — J3081 Allergic rhinitis due to animal (cat) (dog) hair and dander: Secondary | ICD-10-CM | POA: Diagnosis not present

## 2016-01-30 DIAGNOSIS — M9901 Segmental and somatic dysfunction of cervical region: Secondary | ICD-10-CM | POA: Diagnosis not present

## 2016-01-30 DIAGNOSIS — M4003 Postural kyphosis, cervicothoracic region: Secondary | ICD-10-CM | POA: Diagnosis not present

## 2016-01-30 DIAGNOSIS — M5137 Other intervertebral disc degeneration, lumbosacral region: Secondary | ICD-10-CM | POA: Diagnosis not present

## 2016-01-30 DIAGNOSIS — M9905 Segmental and somatic dysfunction of pelvic region: Secondary | ICD-10-CM | POA: Diagnosis not present

## 2016-01-30 DIAGNOSIS — M47812 Spondylosis without myelopathy or radiculopathy, cervical region: Secondary | ICD-10-CM | POA: Diagnosis not present

## 2016-01-30 DIAGNOSIS — J301 Allergic rhinitis due to pollen: Secondary | ICD-10-CM | POA: Diagnosis not present

## 2016-01-30 DIAGNOSIS — M79671 Pain in right foot: Secondary | ICD-10-CM | POA: Diagnosis not present

## 2016-01-30 DIAGNOSIS — J3089 Other allergic rhinitis: Secondary | ICD-10-CM | POA: Diagnosis not present

## 2016-01-30 DIAGNOSIS — M5032 Other cervical disc degeneration, mid-cervical region, unspecified level: Secondary | ICD-10-CM | POA: Diagnosis not present

## 2016-01-30 DIAGNOSIS — M9902 Segmental and somatic dysfunction of thoracic region: Secondary | ICD-10-CM | POA: Diagnosis not present

## 2016-02-02 DIAGNOSIS — F329 Major depressive disorder, single episode, unspecified: Secondary | ICD-10-CM | POA: Diagnosis not present

## 2016-02-02 DIAGNOSIS — J301 Allergic rhinitis due to pollen: Secondary | ICD-10-CM | POA: Diagnosis not present

## 2016-02-04 MED FILL — DESLORATADINE 5 MG TABLET: 5 | 90 days supply | Qty: 90 | Fill #0

## 2016-02-09 DIAGNOSIS — J3089 Other allergic rhinitis: Secondary | ICD-10-CM | POA: Diagnosis not present

## 2016-02-09 DIAGNOSIS — J3081 Allergic rhinitis due to animal (cat) (dog) hair and dander: Secondary | ICD-10-CM | POA: Diagnosis not present

## 2016-02-09 DIAGNOSIS — J301 Allergic rhinitis due to pollen: Secondary | ICD-10-CM | POA: Diagnosis not present

## 2016-02-09 DIAGNOSIS — F329 Major depressive disorder, single episode, unspecified: Secondary | ICD-10-CM | POA: Diagnosis not present

## 2016-02-12 MED FILL — DEXILANT DR 60 MG CAPSULE: 60 | 90 days supply | Qty: 90 | Fill #1

## 2016-02-13 DIAGNOSIS — M9902 Segmental and somatic dysfunction of thoracic region: Secondary | ICD-10-CM | POA: Diagnosis not present

## 2016-02-13 DIAGNOSIS — M5137 Other intervertebral disc degeneration, lumbosacral region: Secondary | ICD-10-CM | POA: Diagnosis not present

## 2016-02-13 DIAGNOSIS — M9905 Segmental and somatic dysfunction of pelvic region: Secondary | ICD-10-CM | POA: Diagnosis not present

## 2016-02-13 DIAGNOSIS — M5032 Other cervical disc degeneration, mid-cervical region, unspecified level: Secondary | ICD-10-CM | POA: Diagnosis not present

## 2016-02-13 DIAGNOSIS — M79671 Pain in right foot: Secondary | ICD-10-CM | POA: Diagnosis not present

## 2016-02-13 DIAGNOSIS — M4003 Postural kyphosis, cervicothoracic region: Secondary | ICD-10-CM | POA: Diagnosis not present

## 2016-02-13 DIAGNOSIS — M9903 Segmental and somatic dysfunction of lumbar region: Secondary | ICD-10-CM | POA: Diagnosis not present

## 2016-02-13 DIAGNOSIS — M47812 Spondylosis without myelopathy or radiculopathy, cervical region: Secondary | ICD-10-CM | POA: Diagnosis not present

## 2016-02-13 DIAGNOSIS — M9901 Segmental and somatic dysfunction of cervical region: Secondary | ICD-10-CM | POA: Diagnosis not present

## 2016-02-16 DIAGNOSIS — J3081 Allergic rhinitis due to animal (cat) (dog) hair and dander: Secondary | ICD-10-CM | POA: Diagnosis not present

## 2016-02-16 DIAGNOSIS — J301 Allergic rhinitis due to pollen: Secondary | ICD-10-CM | POA: Diagnosis not present

## 2016-02-16 DIAGNOSIS — J3089 Other allergic rhinitis: Secondary | ICD-10-CM | POA: Diagnosis not present

## 2016-02-23 DIAGNOSIS — J301 Allergic rhinitis due to pollen: Secondary | ICD-10-CM | POA: Diagnosis not present

## 2016-02-23 DIAGNOSIS — J3081 Allergic rhinitis due to animal (cat) (dog) hair and dander: Secondary | ICD-10-CM | POA: Diagnosis not present

## 2016-02-23 DIAGNOSIS — J3089 Other allergic rhinitis: Secondary | ICD-10-CM | POA: Diagnosis not present

## 2016-02-23 DIAGNOSIS — F329 Major depressive disorder, single episode, unspecified: Secondary | ICD-10-CM | POA: Diagnosis not present

## 2016-02-25 DIAGNOSIS — G5603 Carpal tunnel syndrome, bilateral upper limbs: Secondary | ICD-10-CM | POA: Diagnosis not present

## 2016-02-25 DIAGNOSIS — M79641 Pain in right hand: Secondary | ICD-10-CM | POA: Diagnosis not present

## 2016-02-25 DIAGNOSIS — M79642 Pain in left hand: Secondary | ICD-10-CM | POA: Diagnosis not present

## 2016-02-25 DIAGNOSIS — G5601 Carpal tunnel syndrome, right upper limb: Secondary | ICD-10-CM | POA: Diagnosis not present

## 2016-02-26 MED FILL — MONTELUKAST SOD 10 MG TAB: 10 | 30 days supply | Qty: 30 | Fill #0

## 2016-03-01 DIAGNOSIS — F329 Major depressive disorder, single episode, unspecified: Secondary | ICD-10-CM | POA: Diagnosis not present

## 2016-03-01 DIAGNOSIS — J301 Allergic rhinitis due to pollen: Secondary | ICD-10-CM | POA: Diagnosis not present

## 2016-03-01 DIAGNOSIS — J3089 Other allergic rhinitis: Secondary | ICD-10-CM | POA: Diagnosis not present

## 2016-03-01 DIAGNOSIS — J3081 Allergic rhinitis due to animal (cat) (dog) hair and dander: Secondary | ICD-10-CM | POA: Diagnosis not present

## 2016-03-04 DIAGNOSIS — J301 Allergic rhinitis due to pollen: Secondary | ICD-10-CM | POA: Diagnosis not present

## 2016-03-04 DIAGNOSIS — J3089 Other allergic rhinitis: Secondary | ICD-10-CM | POA: Diagnosis not present

## 2016-03-04 DIAGNOSIS — J3081 Allergic rhinitis due to animal (cat) (dog) hair and dander: Secondary | ICD-10-CM | POA: Diagnosis not present

## 2016-03-08 DIAGNOSIS — F329 Major depressive disorder, single episode, unspecified: Secondary | ICD-10-CM | POA: Diagnosis not present

## 2016-03-08 DIAGNOSIS — Z008 Encounter for other general examination: Secondary | ICD-10-CM | POA: Diagnosis not present

## 2016-03-08 DIAGNOSIS — J301 Allergic rhinitis due to pollen: Secondary | ICD-10-CM | POA: Diagnosis not present

## 2016-03-08 DIAGNOSIS — J3089 Other allergic rhinitis: Secondary | ICD-10-CM | POA: Diagnosis not present

## 2016-03-08 DIAGNOSIS — J3081 Allergic rhinitis due to animal (cat) (dog) hair and dander: Secondary | ICD-10-CM | POA: Diagnosis not present

## 2016-03-15 DIAGNOSIS — J3081 Allergic rhinitis due to animal (cat) (dog) hair and dander: Secondary | ICD-10-CM | POA: Diagnosis not present

## 2016-03-15 DIAGNOSIS — J301 Allergic rhinitis due to pollen: Secondary | ICD-10-CM | POA: Diagnosis not present

## 2016-03-15 DIAGNOSIS — J3089 Other allergic rhinitis: Secondary | ICD-10-CM | POA: Diagnosis not present

## 2016-03-15 DIAGNOSIS — F329 Major depressive disorder, single episode, unspecified: Secondary | ICD-10-CM | POA: Diagnosis not present

## 2016-03-18 DIAGNOSIS — J3081 Allergic rhinitis due to animal (cat) (dog) hair and dander: Secondary | ICD-10-CM | POA: Diagnosis not present

## 2016-03-18 DIAGNOSIS — J3089 Other allergic rhinitis: Secondary | ICD-10-CM | POA: Diagnosis not present

## 2016-03-18 DIAGNOSIS — J301 Allergic rhinitis due to pollen: Secondary | ICD-10-CM | POA: Diagnosis not present

## 2016-03-22 DIAGNOSIS — F329 Major depressive disorder, single episode, unspecified: Secondary | ICD-10-CM | POA: Diagnosis not present

## 2016-03-24 DIAGNOSIS — J3081 Allergic rhinitis due to animal (cat) (dog) hair and dander: Secondary | ICD-10-CM | POA: Diagnosis not present

## 2016-03-24 DIAGNOSIS — J3089 Other allergic rhinitis: Secondary | ICD-10-CM | POA: Diagnosis not present

## 2016-03-24 DIAGNOSIS — J301 Allergic rhinitis due to pollen: Secondary | ICD-10-CM | POA: Diagnosis not present

## 2016-03-24 MED FILL — MONTELUKAST SOD 10 MG TAB: 10 | 90 days supply | Qty: 90 | Fill #0

## 2016-03-24 MED FILL — OLOPATADINE HCL 0.2% EYE DR: 0.2 | 75 days supply | Qty: 8 | Fill #0

## 2016-03-24 MED FILL — MOMETASONE FUROATE 50 MCG S: 50 | 90 days supply | Qty: 51 | Fill #0

## 2016-03-29 DIAGNOSIS — J301 Allergic rhinitis due to pollen: Secondary | ICD-10-CM | POA: Diagnosis not present

## 2016-03-29 DIAGNOSIS — F329 Major depressive disorder, single episode, unspecified: Secondary | ICD-10-CM | POA: Diagnosis not present

## 2016-03-29 DIAGNOSIS — J3081 Allergic rhinitis due to animal (cat) (dog) hair and dander: Secondary | ICD-10-CM | POA: Diagnosis not present

## 2016-03-29 DIAGNOSIS — J3089 Other allergic rhinitis: Secondary | ICD-10-CM | POA: Diagnosis not present

## 2016-03-31 DIAGNOSIS — J3501 Chronic tonsillitis: Secondary | ICD-10-CM | POA: Diagnosis not present

## 2016-03-31 DIAGNOSIS — J343 Hypertrophy of nasal turbinates: Secondary | ICD-10-CM | POA: Diagnosis not present

## 2016-03-31 DIAGNOSIS — J342 Deviated nasal septum: Secondary | ICD-10-CM | POA: Diagnosis not present

## 2016-04-01 ENCOUNTER — Other Ambulatory Visit (INDEPENDENT_AMBULATORY_CARE_PROVIDER_SITE_OTHER): Payer: Self-pay | Admitting: Otolaryngology

## 2016-04-01 DIAGNOSIS — J320 Chronic maxillary sinusitis: Secondary | ICD-10-CM

## 2016-04-05 DIAGNOSIS — J3081 Allergic rhinitis due to animal (cat) (dog) hair and dander: Secondary | ICD-10-CM | POA: Diagnosis not present

## 2016-04-05 DIAGNOSIS — F329 Major depressive disorder, single episode, unspecified: Secondary | ICD-10-CM | POA: Diagnosis not present

## 2016-04-05 DIAGNOSIS — J3089 Other allergic rhinitis: Secondary | ICD-10-CM | POA: Diagnosis not present

## 2016-04-05 DIAGNOSIS — J301 Allergic rhinitis due to pollen: Secondary | ICD-10-CM | POA: Diagnosis not present

## 2016-04-06 ENCOUNTER — Ambulatory Visit (HOSPITAL_COMMUNITY)
Admission: RE | Admit: 2016-04-06 | Discharge: 2016-04-06 | Disposition: A | Discharge status: Home / Self Care | Payer: 59 | Source: Ambulatory Visit | Attending: Otolaryngology | Admitting: Otolaryngology

## 2016-04-06 DIAGNOSIS — D169 Benign neoplasm of bone and articular cartilage, unspecified: Secondary | ICD-10-CM | POA: Insufficient documentation

## 2016-04-06 DIAGNOSIS — J320 Chronic maxillary sinusitis: Secondary | ICD-10-CM

## 2016-04-12 DIAGNOSIS — J3089 Other allergic rhinitis: Secondary | ICD-10-CM | POA: Diagnosis not present

## 2016-04-12 DIAGNOSIS — J301 Allergic rhinitis due to pollen: Secondary | ICD-10-CM | POA: Diagnosis not present

## 2016-04-12 DIAGNOSIS — F329 Major depressive disorder, single episode, unspecified: Secondary | ICD-10-CM | POA: Diagnosis not present

## 2016-04-12 DIAGNOSIS — J3081 Allergic rhinitis due to animal (cat) (dog) hair and dander: Secondary | ICD-10-CM | POA: Diagnosis not present

## 2016-04-16 DIAGNOSIS — J3081 Allergic rhinitis due to animal (cat) (dog) hair and dander: Secondary | ICD-10-CM | POA: Diagnosis not present

## 2016-04-16 DIAGNOSIS — J3089 Other allergic rhinitis: Secondary | ICD-10-CM | POA: Diagnosis not present

## 2016-04-19 ENCOUNTER — Ambulatory Visit: Payer: Self-pay | Admitting: Orthopedic Surgery

## 2016-04-19 ENCOUNTER — Other Ambulatory Visit: Payer: Self-pay | Admitting: Orthopedic Surgery

## 2016-04-19 DIAGNOSIS — M79642 Pain in left hand: Secondary | ICD-10-CM | POA: Diagnosis not present

## 2016-04-19 DIAGNOSIS — G5601 Carpal tunnel syndrome, right upper limb: Secondary | ICD-10-CM | POA: Diagnosis not present

## 2016-04-19 DIAGNOSIS — G5602 Carpal tunnel syndrome, left upper limb: Secondary | ICD-10-CM | POA: Diagnosis not present

## 2016-04-19 DIAGNOSIS — F329 Major depressive disorder, single episode, unspecified: Secondary | ICD-10-CM | POA: Diagnosis not present

## 2016-04-19 DIAGNOSIS — G5603 Carpal tunnel syndrome, bilateral upper limbs: Secondary | ICD-10-CM | POA: Diagnosis not present

## 2016-04-19 DIAGNOSIS — J3089 Other allergic rhinitis: Secondary | ICD-10-CM | POA: Diagnosis not present

## 2016-04-19 DIAGNOSIS — J301 Allergic rhinitis due to pollen: Secondary | ICD-10-CM | POA: Diagnosis not present

## 2016-04-19 DIAGNOSIS — M79641 Pain in right hand: Secondary | ICD-10-CM | POA: Diagnosis not present

## 2016-04-19 DIAGNOSIS — J3081 Allergic rhinitis due to animal (cat) (dog) hair and dander: Secondary | ICD-10-CM | POA: Diagnosis not present

## 2016-04-26 ENCOUNTER — Other Ambulatory Visit: Payer: Self-pay | Admitting: Orthopedic Surgery

## 2016-04-26 DIAGNOSIS — J3081 Allergic rhinitis due to animal (cat) (dog) hair and dander: Secondary | ICD-10-CM | POA: Diagnosis not present

## 2016-04-26 DIAGNOSIS — F329 Major depressive disorder, single episode, unspecified: Secondary | ICD-10-CM | POA: Diagnosis not present

## 2016-04-26 DIAGNOSIS — J3089 Other allergic rhinitis: Secondary | ICD-10-CM | POA: Diagnosis not present

## 2016-04-26 DIAGNOSIS — J301 Allergic rhinitis due to pollen: Secondary | ICD-10-CM | POA: Diagnosis not present

## 2016-04-27 DIAGNOSIS — J343 Hypertrophy of nasal turbinates: Secondary | ICD-10-CM | POA: Diagnosis not present

## 2016-04-27 DIAGNOSIS — J310 Chronic rhinitis: Secondary | ICD-10-CM | POA: Diagnosis not present

## 2016-04-27 DIAGNOSIS — J342 Deviated nasal septum: Secondary | ICD-10-CM | POA: Diagnosis not present

## 2016-04-30 MED FILL — DOXYCYCLINE HYCLATE 100 MG: 100 | 90 days supply | Qty: 180 | Fill #0

## 2016-04-30 MED FILL — DESLORATADINE 5 MG TABLET: 5 | 90 days supply | Qty: 90 | Fill #1

## 2016-05-03 DIAGNOSIS — J301 Allergic rhinitis due to pollen: Secondary | ICD-10-CM | POA: Diagnosis not present

## 2016-05-03 DIAGNOSIS — J3089 Other allergic rhinitis: Secondary | ICD-10-CM | POA: Diagnosis not present

## 2016-05-03 DIAGNOSIS — J3081 Allergic rhinitis due to animal (cat) (dog) hair and dander: Secondary | ICD-10-CM | POA: Diagnosis not present

## 2016-05-05 ENCOUNTER — Other Ambulatory Visit: Payer: Self-pay | Admitting: Otolaryngology

## 2016-05-05 DIAGNOSIS — J3081 Allergic rhinitis due to animal (cat) (dog) hair and dander: Secondary | ICD-10-CM | POA: Diagnosis not present

## 2016-05-05 DIAGNOSIS — J3089 Other allergic rhinitis: Secondary | ICD-10-CM | POA: Diagnosis not present

## 2016-05-13 DIAGNOSIS — J3089 Other allergic rhinitis: Secondary | ICD-10-CM | POA: Diagnosis not present

## 2016-05-13 DIAGNOSIS — Z008 Encounter for other general examination: Secondary | ICD-10-CM | POA: Diagnosis not present

## 2016-05-13 DIAGNOSIS — F329 Major depressive disorder, single episode, unspecified: Secondary | ICD-10-CM | POA: Diagnosis not present

## 2016-05-13 DIAGNOSIS — J301 Allergic rhinitis due to pollen: Secondary | ICD-10-CM | POA: Diagnosis not present

## 2016-05-13 DIAGNOSIS — J3081 Allergic rhinitis due to animal (cat) (dog) hair and dander: Secondary | ICD-10-CM | POA: Diagnosis not present

## 2016-05-14 ENCOUNTER — Encounter (HOSPITAL_BASED_OUTPATIENT_CLINIC_OR_DEPARTMENT_OTHER): Payer: Self-pay | Admitting: *Deleted

## 2016-05-17 DIAGNOSIS — F329 Major depressive disorder, single episode, unspecified: Secondary | ICD-10-CM | POA: Diagnosis not present

## 2016-05-17 DIAGNOSIS — J3089 Other allergic rhinitis: Secondary | ICD-10-CM | POA: Diagnosis not present

## 2016-05-17 DIAGNOSIS — J3081 Allergic rhinitis due to animal (cat) (dog) hair and dander: Secondary | ICD-10-CM | POA: Diagnosis not present

## 2016-05-17 DIAGNOSIS — J301 Allergic rhinitis due to pollen: Secondary | ICD-10-CM | POA: Diagnosis not present

## 2016-05-17 DIAGNOSIS — Z008 Encounter for other general examination: Secondary | ICD-10-CM | POA: Diagnosis not present

## 2016-05-17 MED FILL — VIIBRYD 40 MG TABLET: 40 | 90 days supply | Qty: 90 | Fill #0

## 2016-05-18 ENCOUNTER — Other Ambulatory Visit: Payer: Self-pay | Admitting: Otolaryngology

## 2016-05-18 MED FILL — DEXILANT DR 60 MG CAPSULE: 60 | 90 days supply | Qty: 90 | Fill #0

## 2016-05-20 DIAGNOSIS — J3089 Other allergic rhinitis: Secondary | ICD-10-CM | POA: Diagnosis not present

## 2016-05-20 DIAGNOSIS — J3081 Allergic rhinitis due to animal (cat) (dog) hair and dander: Secondary | ICD-10-CM | POA: Diagnosis not present

## 2016-05-21 ENCOUNTER — Ambulatory Visit (HOSPITAL_BASED_OUTPATIENT_CLINIC_OR_DEPARTMENT_OTHER)
Admit: 2016-05-21 | Discharge: 2016-05-21 | Disposition: A | Discharge status: Home / Self Care | Payer: 59 | Source: Ambulatory Visit | Attending: Orthopedic Surgery | Admitting: Orthopedic Surgery

## 2016-05-21 ENCOUNTER — Encounter (HOSPITAL_BASED_OUTPATIENT_CLINIC_OR_DEPARTMENT_OTHER)
Disposition: A | Discharge status: Home / Self Care | Payer: Self-pay | Source: Ambulatory Visit | Attending: Orthopedic Surgery

## 2016-05-21 ENCOUNTER — Ambulatory Visit (HOSPITAL_BASED_OUTPATIENT_CLINIC_OR_DEPARTMENT_OTHER): Payer: 59 | Admitting: Certified Registered"

## 2016-05-21 ENCOUNTER — Encounter (HOSPITAL_BASED_OUTPATIENT_CLINIC_OR_DEPARTMENT_OTHER): Payer: Self-pay | Admitting: Certified Registered"

## 2016-05-21 DIAGNOSIS — G5601 Carpal tunnel syndrome, right upper limb: Secondary | ICD-10-CM | POA: Diagnosis not present

## 2016-05-21 DIAGNOSIS — F419 Anxiety disorder, unspecified: Secondary | ICD-10-CM | POA: Insufficient documentation

## 2016-05-21 DIAGNOSIS — J343 Hypertrophy of nasal turbinates: Secondary | ICD-10-CM | POA: Diagnosis not present

## 2016-05-21 DIAGNOSIS — K219 Gastro-esophageal reflux disease without esophagitis: Secondary | ICD-10-CM | POA: Diagnosis not present

## 2016-05-21 DIAGNOSIS — J45909 Unspecified asthma, uncomplicated: Secondary | ICD-10-CM | POA: Diagnosis not present

## 2016-05-21 DIAGNOSIS — G5603 Carpal tunnel syndrome, bilateral upper limbs: Secondary | ICD-10-CM | POA: Diagnosis not present

## 2016-05-21 DIAGNOSIS — F329 Major depressive disorder, single episode, unspecified: Secondary | ICD-10-CM | POA: Insufficient documentation

## 2016-05-21 DIAGNOSIS — J342 Deviated nasal septum: Secondary | ICD-10-CM | POA: Insufficient documentation

## 2016-05-21 DIAGNOSIS — Z880 Allergy status to penicillin: Secondary | ICD-10-CM | POA: Diagnosis not present

## 2016-05-21 DIAGNOSIS — J3489 Other specified disorders of nose and nasal sinuses: Secondary | ICD-10-CM | POA: Diagnosis not present

## 2016-05-21 HISTORY — DX: Major depressive disorder, single episode, unspecified: F32.9

## 2016-05-21 HISTORY — DX: Anxiety disorder, unspecified: F41.9

## 2016-05-21 HISTORY — PX: CARPAL TUNNEL RELEASE: SHX101

## 2016-05-21 HISTORY — PX: NASAL SEPTOPLASTY W/ TURBINOPLASTY: SHX2070

## 2016-05-21 HISTORY — DX: Depression, unspecified: F32.A

## 2016-05-21 SURGERY — SEPTOPLASTY, NOSE, WITH NASAL TURBINATE REDUCTION
Anesthesia: General

## 2016-05-21 SURGERY — CARPAL TUNNEL RELEASE
Anesthesia: General | Site: Wrist | Laterality: Right

## 2016-05-21 MED ORDER — METOCLOPRAMIDE HCL 5 MG/ML IJ SOLN: 10.0000 mg | Freq: Once | INTRAMUSCULAR | Status: DC | PRN

## 2016-05-21 MED ORDER — ROCURONIUM BROMIDE 100 MG/10ML IV SOLN
INTRAVENOUS | Status: DC | PRN
  Administered 2016-05-21: 50 mg via INTRAVENOUS

## 2016-05-21 MED ORDER — DEXAMETHASONE SODIUM PHOSPHATE 4 MG/ML IJ SOLN
INTRAMUSCULAR | Status: DC | PRN
  Administered 2016-05-21: 10 mg via INTRAVENOUS

## 2016-05-21 MED ORDER — SCOPOLAMINE 1 MG/3DAYS TD PT72
1.0000 | MEDICATED_PATCH | Freq: Once | TRANSDERMAL | Status: DC | PRN
  Administered 2016-05-21: 1.5 mg via TRANSDERMAL

## 2016-05-21 MED ORDER — CHLORHEXIDINE GLUCONATE 4 % EX LIQD: 60.0000 mL | Freq: Once | CUTANEOUS | Status: DC

## 2016-05-21 MED ORDER — CLINDAMYCIN HCL 300 MG PO CAPS: 300.0000 mg | ORAL_CAPSULE | Freq: Three times a day (TID) | ORAL | 0 refills | Status: AC

## 2016-05-21 MED ORDER — FENTANYL CITRATE (PF) 100 MCG/2ML IJ SOLN
INTRAMUSCULAR | Status: AC
  Filled 2016-05-21: qty 2

## 2016-05-21 MED ORDER — VANCOMYCIN HCL IN DEXTROSE 1-5 GM/200ML-% IV SOLN
INTRAVENOUS | Status: AC
  Filled 2016-05-21: qty 200

## 2016-05-21 MED ORDER — PROPOFOL 10 MG/ML IV BOLUS
INTRAVENOUS | Status: DC | PRN
  Administered 2016-05-21: 200 mg via INTRAVENOUS

## 2016-05-21 MED ORDER — SCOPOLAMINE 1 MG/3DAYS TD PT72
MEDICATED_PATCH | TRANSDERMAL | Status: AC
  Filled 2016-05-21: qty 1

## 2016-05-21 MED ORDER — SCOPOLAMINE 1 MG/3DAYS TD PT72: 1.0000 | MEDICATED_PATCH | TRANSDERMAL | Status: DC

## 2016-05-21 MED ORDER — BUPIVACAINE HCL (PF) 0.25 % IJ SOLN
INTRAMUSCULAR | Status: DC | PRN
Start: 2016-05-21 — End: 2016-05-21
  Administered 2016-05-21: 5 mL

## 2016-05-21 MED ORDER — FENTANYL CITRATE (PF) 100 MCG/2ML IJ SOLN
50.0000 ug | INTRAMUSCULAR | Status: DC | PRN
  Administered 2016-05-21 (×2): 50 ug via INTRAVENOUS

## 2016-05-21 MED ORDER — SUGAMMADEX SODIUM 500 MG/5ML IV SOLN
INTRAVENOUS | Status: DC | PRN
  Administered 2016-05-21: 350 mg via INTRAVENOUS

## 2016-05-21 MED ORDER — LIDOCAINE-EPINEPHRINE 1 %-1:100000 IJ SOLN
INTRAMUSCULAR | Status: DC | PRN
  Administered 2016-05-21: 2.5 mL

## 2016-05-21 MED ORDER — EPHEDRINE SULFATE 50 MG/ML IJ SOLN
INTRAMUSCULAR | Status: DC | PRN
  Administered 2016-05-21 (×3): 10 mg via INTRAVENOUS

## 2016-05-21 MED ORDER — LIDOCAINE HCL (CARDIAC) 20 MG/ML IV SOLN
INTRAVENOUS | Status: DC | PRN
  Administered 2016-05-21: 60 mg via INTRAVENOUS

## 2016-05-21 MED ORDER — ONDANSETRON HCL 4 MG/2ML IJ SOLN
INTRAMUSCULAR | Status: AC
  Filled 2016-05-21: qty 2

## 2016-05-21 MED ORDER — LACTATED RINGERS IV SOLN
INTRAVENOUS | Status: DC
  Administered 2016-05-21 (×2): via INTRAVENOUS

## 2016-05-21 MED ORDER — VANCOMYCIN HCL IN DEXTROSE 1-5 GM/200ML-% IV SOLN
1000.0000 mg | INTRAVENOUS | Status: AC
  Administered 2016-05-21 (×2): 1000 mg via INTRAVENOUS

## 2016-05-21 MED ORDER — ONDANSETRON HCL 4 MG/2ML IJ SOLN
INTRAMUSCULAR | Status: DC | PRN
  Administered 2016-05-21: 4 mg via INTRAVENOUS

## 2016-05-21 MED ORDER — ROCURONIUM BROMIDE 10 MG/ML (PF) SYRINGE
PREFILLED_SYRINGE | INTRAVENOUS | Status: AC
  Filled 2016-05-21: qty 5

## 2016-05-21 MED ORDER — FENTANYL CITRATE (PF) 100 MCG/2ML IJ SOLN
25.0000 ug | INTRAMUSCULAR | Status: DC | PRN
  Administered 2016-05-21: 25 ug via INTRAVENOUS

## 2016-05-21 MED ORDER — PROPOFOL 500 MG/50ML IV EMUL
INTRAVENOUS | Status: AC
  Filled 2016-05-21: qty 50

## 2016-05-21 MED ORDER — LIDOCAINE 2% (20 MG/ML) 5 ML SYRINGE
INTRAMUSCULAR | Status: AC
  Filled 2016-05-21: qty 5

## 2016-05-21 MED ORDER — MIDAZOLAM HCL 2 MG/2ML IJ SOLN
INTRAMUSCULAR | Status: AC
  Filled 2016-05-21: qty 2

## 2016-05-21 MED ORDER — HYDROCODONE-ACETAMINOPHEN 7.5-325 MG PO TABS: 1.0000 | ORAL_TABLET | Freq: Once | ORAL | Status: DC | PRN

## 2016-05-21 MED ORDER — MIDAZOLAM HCL 2 MG/2ML IJ SOLN
1.0000 mg | INTRAMUSCULAR | Status: DC | PRN
  Administered 2016-05-21: 2 mg via INTRAVENOUS

## 2016-05-21 MED ORDER — MEPERIDINE HCL 25 MG/ML IJ SOLN: 6.2500 mg | INTRAMUSCULAR | Status: DC | PRN

## 2016-05-21 MED ORDER — DEXAMETHASONE SODIUM PHOSPHATE 10 MG/ML IJ SOLN
INTRAMUSCULAR | Status: AC
  Filled 2016-05-21: qty 1

## 2016-05-21 MED FILL — CLINDAMYCIN HCL 300 MG CAPS: 300 | 5 days supply | Qty: 15 | Fill #0

## 2016-05-21 MED FILL — HYDROCODON-APAP 5-325: 5-325 | 4 days supply | Qty: 45 | Fill #0

## 2016-05-21 SURGICAL SUPPLY — 67 items
ATTRACTOMAT 16X20 MAGNETIC DRP (DRAPES) IMPLANT
BANDAGE ACE 3X5.8 VEL STRL LF (GAUZE/BANDAGES/DRESSINGS) ×3 IMPLANT
BLADE CARPAL TUNNEL SNGL USE (BLADE) ×3 IMPLANT
BLADE SURG 15 STRL LF DISP TIS (BLADE) ×4 IMPLANT
BLADE SURG 15 STRL SS (BLADE) ×2
BNDG CONFORM 3 STRL LF (GAUZE/BANDAGES/DRESSINGS) ×3 IMPLANT
BRUSH SCRUB EZ PLAIN DRY (MISCELLANEOUS) ×3 IMPLANT
CANISTER SUCT 1200ML W/VALVE (MISCELLANEOUS) ×3 IMPLANT
COAGULATOR SUCT 8FR VV (MISCELLANEOUS) ×3 IMPLANT
CORDS BIPOLAR (ELECTRODE) ×3 IMPLANT
COVER BACK TABLE 60X90IN (DRAPES) ×3 IMPLANT
CUFF TOURNIQUET SINGLE 18IN (TOURNIQUET CUFF) IMPLANT
DECANTER SPIKE VIAL GLASS SM (MISCELLANEOUS) IMPLANT
DRAIN PENROSE 1/4X12 LTX STRL (WOUND CARE) IMPLANT
DRAPE EXTREMITY T 121X128X90 (DRAPE) ×3 IMPLANT
DRAPE SURG 17X23 STRL (DRAPES) ×3 IMPLANT
DRSG EMULSION OIL 3X3 NADH (GAUZE/BANDAGES/DRESSINGS) ×3 IMPLANT
DRSG NASOPORE 8CM (GAUZE/BANDAGES/DRESSINGS) IMPLANT
DRSG TELFA 3X8 NADH (GAUZE/BANDAGES/DRESSINGS) IMPLANT
ELECT REM PT RETURN 9FT ADLT (ELECTROSURGICAL) ×3
ELECTRODE REM PT RTRN 9FT ADLT (ELECTROSURGICAL) ×2 IMPLANT
GAUZE SPONGE 4X4 12PLY STRL (GAUZE/BANDAGES/DRESSINGS) IMPLANT
GAUZE SPONGE 4X4 16PLY XRAY LF (GAUZE/BANDAGES/DRESSINGS) IMPLANT
GAUZE XEROFORM 1X8 LF (GAUZE/BANDAGES/DRESSINGS) ×3 IMPLANT
GLOVE BIO SURGEON STRL SZ7.5 (GLOVE) ×3 IMPLANT
GLOVE BIOGEL M STRL SZ7.5 (GLOVE) IMPLANT
GLOVE SS BIOGEL STRL SZ 8 (GLOVE) ×2 IMPLANT
GLOVE SUPERSENSE BIOGEL SZ 8 (GLOVE) ×1
GOWN STRL REUS W/ TWL LRG LVL3 (GOWN DISPOSABLE) ×6 IMPLANT
GOWN STRL REUS W/ TWL XL LVL3 (GOWN DISPOSABLE) ×2 IMPLANT
GOWN STRL REUS W/TWL LRG LVL3 (GOWN DISPOSABLE) ×3
GOWN STRL REUS W/TWL XL LVL3 (GOWN DISPOSABLE) ×1
LOOP VESSEL MAXI BLUE (MISCELLANEOUS) IMPLANT
NDL SAFETY ECLIPSE 18X1.5 (NEEDLE) ×2 IMPLANT
NEEDLE HYPO 18GX1.5 SHARP (NEEDLE) ×1
NEEDLE HYPO 22GX1.5 SAFETY (NEEDLE) IMPLANT
NEEDLE HYPO 25X1 1.5 SAFETY (NEEDLE) ×9 IMPLANT
NS IRRIG 1000ML POUR BTL (IV SOLUTION) ×6 IMPLANT
PACK BASIN DAY SURGERY FS (CUSTOM PROCEDURE TRAY) ×6 IMPLANT
PACK ENT DAY SURGERY (CUSTOM PROCEDURE TRAY) ×3 IMPLANT
PAD ALCOHOL SWAB (MISCELLANEOUS) ×24 IMPLANT
PAD CAST 3X4 CTTN HI CHSV (CAST SUPPLIES) ×4 IMPLANT
PADDING CAST ABS 4INX4YD NS (CAST SUPPLIES) ×1
PADDING CAST ABS COTTON 4X4 ST (CAST SUPPLIES) ×2 IMPLANT
PADDING CAST COTTON 3X4 STRL (CAST SUPPLIES) ×2
SHEET MEDIUM DRAPE 40X70 STRL (DRAPES) ×3 IMPLANT
SLEEVE SCD COMPRESS KNEE MED (MISCELLANEOUS) IMPLANT
SOLUTION BUTLER CLEAR DIP (MISCELLANEOUS) ×3 IMPLANT
SPLINT NASAL AIRWAY SILICONE (MISCELLANEOUS) IMPLANT
SPONGE GAUZE 2X2 8PLY STRL LF (GAUZE/BANDAGES/DRESSINGS) ×3 IMPLANT
SPONGE NEURO XRAY DETECT 1X3 (DISPOSABLE) ×3 IMPLANT
STOCKINETTE 4X48 STRL (DRAPES) ×3 IMPLANT
SUT CHROMIC 4 0 P 3 18 (SUTURE) ×3 IMPLANT
SUT PLAIN 4 0 ~~LOC~~ 1 (SUTURE) ×3 IMPLANT
SUT PROLENE 3 0 PS 2 (SUTURE) ×3 IMPLANT
SUT PROLENE 4 0 PS 2 18 (SUTURE) ×3 IMPLANT
SUT VIC AB 4-0 P-3 18XBRD (SUTURE) IMPLANT
SUT VIC AB 4-0 P3 18 (SUTURE)
SYR BULB 3OZ (MISCELLANEOUS) ×3 IMPLANT
SYR CONTROL 10ML LL (SYRINGE) ×6 IMPLANT
TOWEL OR 17X24 6PK STRL BLUE (TOWEL DISPOSABLE) ×6 IMPLANT
TOWEL OR NON WOVEN STRL DISP B (DISPOSABLE) ×3 IMPLANT
TRAY DSU PREP LF (CUSTOM PROCEDURE TRAY) ×3 IMPLANT
TUBE SALEM SUMP 12R W/ARV (TUBING) IMPLANT
TUBE SALEM SUMP 16 FR W/ARV (TUBING) ×3 IMPLANT
UNDERPAD 30X30 (UNDERPADS AND DIAPERS) ×3 IMPLANT
YANKAUER SUCT BULB TIP NO VENT (SUCTIONS) ×3 IMPLANT

## 2016-05-21 NOTE — Op Note (Signed)
DATE OF PROCEDURE: 05/21/2016  OPERATIVE REPORT   SURGEON: Leta Baptist, MD   PREOPERATIVE DIAGNOSES:  1. Severe nasal septal deviation.  2. Bilateral inferior turbinate hypertrophy.  3. Chronic nasal obstruction.  POSTOPERATIVE DIAGNOSES:  1. Severe nasal septal deviation.  2. Bilateral inferior turbinate hypertrophy.  3. Chronic nasal obstruction.  PROCEDURE PERFORMED:  1. Septoplasty.  2. Bilateral partial inferior turbinate resection.   ANESTHESIA: General endotracheal tube anesthesia.   COMPLICATIONS: None.   ESTIMATED BLOOD LOSS: 50 mL.   INDICATION FOR PROCEDURE: Colleen Schmitt is a 46 y.o. female with a history of chronic nasal obstruction. The patient was  treated with antihistamine, decongestant, steroid nasal spray, and immunotherapy. However, the patient continues to be symptomatic. On examination, the patient was noted to have bilateral severe inferior turbinate hypertrophy and significant nasal septal deviation, causing significant nasal obstruction. Based on the above findings, the decision was made for the patient to undergo the above-stated procedure. The risks, benefits, alternatives, and details of the procedure were discussed with the patient. Questions were invited and answered. Informed consent was obtained.   DESCRIPTION OF PROCEDURE: The patient was taken to the operating room and placed supine on the operating table. General endotracheal tube anesthesia was administered by the anesthesiologist. The patient was positioned, and prepped and draped in the standard fashion for nasal surgery. Pledgets soaked with Afrin were placed in both nasal cavities for decongestion. The pledgets were subsequently removed. The above mentioned severe septal deviation was again noted. 1% lidocaine with 1:100,000 epinephrine was injected onto the nasal septum bilaterally. A hemitransfixion incision was made on the left side. The mucosal flap was carefully elevated on the left side. A  cartilaginous incision was made 1 cm superior to the caudal margin of the nasal septum. Mucosal flap was also elevated on the right side in the similar fashion. It should be noted that due to the severe septal deviation, the deviated portion of the cartilaginous and bony septum had to be removed in piecemeal fashion. Once the deviated portions were removed, a straight midline septum was achieved. The septum was then quilted with 4-0 plain gut sutures. The hemitransfixion incision was closed with interrupted 4-0 chromic sutures. Doyle splints were applied.   Prior to the [facility] splint application, the inferior one half of both hypertrophied inferior turbinate was crossclamped with a Kelly clamp. The inferior one half of each inferior turbinate was then resected with a pair of cross cutting scissors. Hemostasis was achieved with a suction cautery device.   The care of the patient was turned over to the anesthesiologist. The patient was awakened from anesthesia without difficulty. The patient was extubated and transferred to the recovery room in good condition.   OPERATIVE FINDINGS: Severe nasal septal deviation and bilateral inferior turbinate hypertrophy.   SPECIMEN: None.   FOLLOWUP CARE: The patient be discharged home once she is awake and alert. The patient will be placed on Norco p.r.n. pain, and clindamycin for 5 days. The patient will follow up in my office in approximately 1 week for splint removal.   Kaylin Schellenberg Raynelle Bring, MD

## 2016-05-21 NOTE — Anesthesia Procedure Notes (Signed)
Procedure Name: Intubation Date/Time: 05/21/2016 9:08 AM Performed by: Zed Wanninger D Pre-anesthesia Checklist: Patient identified, Emergency Drugs available, Suction available and Patient being monitored Patient Re-evaluated:Patient Re-evaluated prior to inductionOxygen Delivery Method: Circle system utilized Preoxygenation: Pre-oxygenation with 100% oxygen Intubation Type: IV induction Ventilation: Mask ventilation without difficulty Tube type: Oral Tube size: 7.0 mm Number of attempts: 1 Airway Equipment and Method: Stylet and Oral airway Placement Confirmation: ETT inserted through vocal cords under direct vision,  positive ETCO2 and breath sounds checked- equal and bilateral Secured at: 21 cm Tube secured with: Tape Dental Injury: Teeth and Oropharynx as per pre-operative assessment

## 2016-05-21 NOTE — Transfer of Care (Signed)
Immediate Anesthesia Transfer of Care Note  Patient: Colleen Schmitt  Procedure(s) Performed: Procedure(s) with comments: CARPAL TUNNEL RELEASE (Right) - Requests 1 hr NASAL SEPTOPLASTY WITH TURBINATE REDUCTION (N/A)  Patient Location: PACU  Anesthesia Type:General  Level of Consciousness: awake, alert , oriented and patient cooperative  Airway & Oxygen Therapy: Patient Spontanous Breathing and Patient connected to face mask oxygen  Post-op Assessment: Report given to RN and Post -op Vital signs reviewed and stable  Post vital signs: Reviewed and stable  Last Vitals:  Vitals:   05/21/16 0816  BP: 108/65  Pulse: 65  Resp: 18  Temp: 36.8 C    Last Pain:  Vitals:   05/21/16 0816  TempSrc: Oral  PainSc: 1       Patients Stated Pain Goal: 1 (14/23/95 3202)  Complications: No apparent anesthesia complications

## 2016-05-21 NOTE — H&P (Signed)
Cc: Chronic nasal obstruction  HPI: The patient is a 46 year old female who returns today for her follow-up evaluation. She was last seen 1 month ago. At that time, she was noted to have chronic rhinitis, nasal mucosal congestion, nasal septal deviation, and bilateral inferior turbinate hypertrophy.  She was continued on her allergy treatment regimen of Flonase nasal spray, antihistamine, and weekly immunotherapy.  She subsequently underwent a paranasal sinus CT scan.  The CT showed no evidence of acute or chronic sinusitis.  However, severe nasal septal deviation and bilateral inferior turbinate hypertrophy were noted. The patient returns today complaining of persistent nasal obstruction.  She also reports frequent facial/sinus pressure and pain.  She has been a habitual mouth breather.  She complains of loud snoring at night.  Currently she denies any fever, purulent nasal drainage or visual change.   Exam: The nasal cavities were decongested and anesthetised with a combination of oxymetazoline and 4% lidocaine solution.  The flexible scope was inserted into the right nasal cavity.  Endoscopy of the inferior and middle meatus was performed.  Edematous mucosa was noted.  No polyp, mass, or lesion was appreciated.  Olfactory cleft was clear.  Nasopharynx was clear. Severe NSD. Turbinates were hypertrophied but without mass.  Incomplete response to decongestion.  The procedure was repeated on the contralateral side with similar findings.  The patient tolerated the procedure well.  Instructions were given to avoid eating or drinking for 2 hours.    Assessment: 1.  Chronic nasal obstruction, secondary to severe nasal septal deviation and bilateral inferior turbinate hypertrophy.  More than 95% of her nasal passageways are obstructed bilaterally.  2.  There is no evidence of acute or chronic sinusitis on today's nasal endoscopy examination and her sinus CT scan.   Plan: 1.  The nasal endoscopy findings and  the CT images are reviewed with the patient.  2.  The patient should continue with her allergy treatment regimen.  The options of septoplasty and turbinate reduction to improve her nasal passageways are discussed.  The risks, benefits, alternatives and details of the procedures are reviewed.  3.  The patient would like to proceed with the procedures.

## 2016-05-21 NOTE — Anesthesia Preprocedure Evaluation (Addendum)
Anesthesia Evaluation  Patient identified by MRN, date of birth, ID band Patient awake    Reviewed: Allergy & Precautions, NPO status , Patient's Chart, lab work & pertinent test results  Airway Mallampati: II  TM Distance: >3 FB Neck ROM: Full    Dental no notable dental hx. (+) Teeth Intact, Caps   Pulmonary asthma ,  Nasal turbinate hypertrophy with nasal obstruction   Pulmonary exam normal breath sounds clear to auscultation       Cardiovascular negative cardio ROS Normal cardiovascular exam Rhythm:Regular Rate:Normal     Neuro/Psych PSYCHIATRIC DISORDERS Anxiety Depression negative neurological ROS     GI/Hepatic Neg liver ROS, GERD  Medicated and Controlled,Hx/o Lap Gastric Band 2008   Endo/Other  negative endocrine ROS  Renal/GU negative Renal ROS  negative genitourinary   Musculoskeletal negative musculoskeletal ROS (+)   Abdominal Normal abdominal exam  (+)   Peds  Hematology negative hematology ROS (+)   Anesthesia Other Findings   Reproductive/Obstetrics                           Anesthesia Physical Anesthesia Plan  ASA: II  Anesthesia Plan: General   Post-op Pain Management:    Induction: Intravenous  Airway Management Planned: Oral ETT  Additional Equipment:   Intra-op Plan:   Post-operative Plan: Extubation in OR  Informed Consent: I have reviewed the patients History and Physical, chart, labs and discussed the procedure including the risks, benefits and alternatives for the proposed anesthesia with the patient or authorized representative who has indicated his/her understanding and acceptance.   Dental advisory given  Plan Discussed with: Anesthesiologist, CRNA and Surgeon  Anesthesia Plan Comments:        Anesthesia Quick Evaluation

## 2016-05-21 NOTE — Op Note (Signed)
Op note [id number] Khoi Hamberger MD

## 2016-05-21 NOTE — Discharge Instructions (Addendum)
Keep bandage clean and dry.  Call for any problems.  No smoking.  Criteria for driving a car: you should be off your pain medicine for 7-8 hours, able to drive one handed(confident), thinking clearly and feeling able in your judgement to drive. Continue elevation as it will decrease swelling.  If instructed by MD move your fingers within the confines of the bandage/splint.  Use ice if instructed by your MD. Call immediately for any sudden loss of feeling in your hand/arm or change in functional abilities of the extremity.We recommend that you to take vitamin C 1000 mg a day to promote healing. We also recommend that if you require  pain medicine that you take a stool softener to prevent constipation as most pain medicines will have constipation side effects. We recommend either Peri-Colace or Senokot and recommend that you also consider adding MiraLAX as well to prevent the constipation affects from pain medicine if you are required to use them. These medicines are over the counter and may be purchased at a local pharmacy. A cup of yogurt and a probiotic can also be helpful during the recovery process as the medicines can disrupt your intestinal environment.  ----------------  POSTOPERATIVE INSTRUCTIONS FOR PATIENTS HAVING NASAL OR SINUS OPERATIONS ACTIVITY: Restrict activity at home for the first two days, resting as much as possible. Light activity is best. You may usually return to work within a week. You should refrain from nose blowing, strenuous activity, or heavy lifting greater than 20lbs for a total of three weeks after your operation.  If sneezing cannot be avoided, sneeze with your mouth open. DISCOMFORT: You may experience a dull headache and pressure along with nasal congestion and discharge. These symptoms may be worse during the first week after the operation but may last as long as two to four weeks.  Please take Tylenol or the pain medication that has been prescribed for you. Do not take  aspirin or aspirin containing medications since they may cause bleeding.  You may experience symptoms of post nasal drainage, nasal congestion, headaches and fatigue for two or three months after your operation.  BLEEDING: You may have some blood tinged nasal drainage for approximately two weeks after the operation.  The discharge will be worse for the first week.  Please call our office at [phone] or go to the nearest hospital emergency room if you experience any of the following: heavy, bright red blood from your nose or mouth that lasts longer than ten minutes or coughing up or vomiting bright red blood or blood clots. GENERAL CONSIDERATIONS: 1. A gauze dressing will be placed on your upper lip to absorb any drainage after the operation. You may need to change this several times a day.  If you do not have very much drainage, you may remove the dressing.  Remember that you may gently wipe your nose with a tissue and sniff in, but DO NOT blow your nose. 2. Please keep all of your postoperative appointments.  Your final results after the operation will depend on proper follow-up.  The initial visit is usually four to seven days after the operation.  During this visit, the remaining nasal packing and internal septal splints will be removed.  Your nasal and sinus cavities will be cleaned.  During the second visit, your nasal and sinus cavities will be cleaned again. Have someone drive you to your first two postoperative appointments. We suggest that you take your prescribed pain medication about  hour prior to each of  these two appointments.  3. How you care for your nose after the operation will influence the results that you obtain.  You should follow all directions, take your medication as prescribed, and call our office [phone] with any problems or questions. 4. You may be more comfortable sleeping with your head elevated on two pillows. 5. Do not take any medications that we have not  prescribed or recommended. WARNING SIGNS: if any of the following should occur, please call our office: 1. Bright red bleeding which lasts more than 10 minutes. 2. Persistent fever greater than 102F. 3. Persistent vomiting. 4. Severe and constant pain that is not relieved by prescribed pain medication. 5. Trauma to the nose. 6. Rash or unusual side effects from any medicines.      Post Anesthesia Home Care Instructions  Activity: Get plenty of rest for the remainder of the day. A responsible individual must stay with you for 24 hours following the procedure.  For the next 24 hours, DO NOT: -Drive a car -Paediatric nurse -Drink alcoholic beverages -Take any medication unless instructed by your physician -Make any legal decisions or sign important papers.  Meals: Start with liquid foods such as gelatin or soup. Progress to regular foods as tolerated. Avoid greasy, spicy, heavy foods. If nausea and/or vomiting occur, drink only clear liquids until the nausea and/or vomiting subsides. Call your physician if vomiting continues.  Special Instructions/Symptoms: Your throat may feel dry or sore from the anesthesia or the breathing tube placed in your throat during surgery. If this causes discomfort, gargle with warm salt water. The discomfort should disappear within 24 hours.  If you had a scopolamine patch placed behind your ear for the management of post- operative nausea and/or vomiting:  1. The medication in the patch is effective for 72 hours, after which it should be removed.  Wrap patch in a tissue and discard in the trash. Wash hands thoroughly with soap and water. 2. You may remove the patch earlier than 72 hours if you experience unpleasant side effects which may include dry mouth, dizziness or visual disturbances. 3. Avoid touching the patch. Wash your hands with soap and water after contact with the patch.

## 2016-05-21 NOTE — H&P (Signed)
Colleen Schmitt is an 46 y.o. female.   Chief Complaint: Bil CTS plan R CTR HPI: Patient presents for evaluation and treatment of the of their upper extremity predicament. The patient denies neck, back, chest or  abdominal pain. The patient notes that they have no lower extremity problems. The patients primary complaint is noted. We are planning surgical care pathway for the upper extremity.  Past Medical History:  Diagnosis Date  . Abdominal pain   . Anxiety   . Asthma   . Contact lens/glasses fitting   . Cough   . Depression   . Fatigue   . GERD (gastroesophageal reflux disease)   . Hoarseness   . Poor appetite     Past Surgical History:  Procedure Laterality Date  . lap band  02/14/2006  . ORIF FEMUR FRACTURE  1993   right    Family History  Problem Relation Age of Onset  . Cancer Paternal Grandfather        throat  . Osteoporosis Mother   . Hypertension Father   . Atrial fibrillation Father   . Hypertension Sister   . Mitral valve prolapse Sister    Social History:  reports that she has never smoked. She has never used smokeless tobacco. She reports that she drinks alcohol. She reports that she does not use drugs.  Allergies:  Allergies  Allergen Reactions  . Adhesive [Tape] Rash  . Penicillins Itching    Palms of hands    Medications Prior to Admission  Medication Sig Dispense Refill  . albuterol (PROVENTIL HFA;VENTOLIN HFA) 108 (90 Base) MCG/ACT inhaler Inhale 2 puffs into the lungs every 6 (six) hours as needed for wheezing or shortness of breath.    . desloratadine (CLARINEX) 5 MG tablet Take 5 mg by mouth daily.    Marland Kitchen dexlansoprazole (DEXILANT) 60 MG capsule Take 60 mg by mouth daily.    Marland Kitchen doxycycline (VIBRA-TABS) 100 MG tablet Take 100 mg by mouth daily.    . montelukast (SINGULAIR) 10 MG tablet Take 10 mg by mouth at bedtime.    . triamcinolone (NASACORT) 55 MCG/ACT AERO nasal inhaler Place 2 sprays into the nose daily.    . Vilazodone HCl (VIIBRYD) 20  MG TABS Take by mouth daily.      No results found for this or any previous visit (from the past 48 hour(s)). No results found.  Review of Systems  Respiratory: Negative.   Cardiovascular: Negative.   Gastrointestinal: Negative.   Genitourinary: Negative.     Blood pressure 108/65, pulse 65, temperature 98.2 F (36.8 C), temperature source Oral, resp. rate 18, height 5\' 8"  (1.727 m), weight 87.5 kg (193 lb), SpO2 98 %. Physical Exam  R CTS with positive findings- Tinels and Phalens and MNCT The patient is alert and oriented in no acute distress. The patient complains of pain in the affected upper extremity.  The patient is noted to have a normal HEENT exam. Lung fields show equal chest expansion and no shortness of breath. Abdomen exam is nontender without distention. Lower extremity examination does not show any fracture dislocation or blood clot symptoms. Pelvis is stable and the neck and back are stable and nontender. Assessment/Plan Plan right CTR carpal tunnel release We are planning surgery for your upper extremity. The risk and benefits of surgery to include risk of bleeding, infection, anesthesia,  damage to normal structures and failure of the surgery to accomplish its intended goals of relieving symptoms and restoring function have been discussed in  detail. With this in mind we plan to proceed. I have specifically discussed with the patient the pre-and postoperative regime and the dos and don'ts and risk and benefits in great detail. Risk and benefits of surgery also include risk of dystrophy(CRPS), chronic nerve pain, failure of the healing process to go onto completion and other inherent risks of surgery The relavent the pathophysiology of the disease/injury process, as well as the alternatives for treatment and postoperative course of action has been discussed in great detail with the patient who desires to proceed.  We will do everything in our power to help you (the  patient) restore function to the upper extremity. It is a pleasure to see this patient today.  Paulene Floor, MD 05/21/2016, 8:38 AM

## 2016-05-21 NOTE — Anesthesia Postprocedure Evaluation (Signed)
Anesthesia Post Note  Patient: Colleen Schmitt  Procedure(s) Performed: Procedure(s) (LRB): CARPAL TUNNEL RELEASE (Right) NASAL SEPTOPLASTY WITH TURBINATE REDUCTION (N/A)  Patient location during evaluation: PACU Anesthesia Type: General Level of consciousness: awake and alert Pain management: pain level controlled Vital Signs Assessment: post-procedure vital signs reviewed and stable Respiratory status: spontaneous breathing, nonlabored ventilation and respiratory function stable Cardiovascular status: blood pressure returned to baseline and stable Postop Assessment: no signs of nausea or vomiting Anesthetic complications: no       Last Vitals:  Vitals:   05/21/16 1115 05/21/16 1130  BP: 136/86 133/86  Pulse: 84 83  Resp: 14 16  Temp:      Last Pain:  Vitals:   05/21/16 1130  TempSrc:   PainSc: 6                  [name] A.

## 2016-05-24 ENCOUNTER — Encounter (HOSPITAL_BASED_OUTPATIENT_CLINIC_OR_DEPARTMENT_OTHER): Payer: Self-pay | Admitting: Orthopedic Surgery

## 2016-05-24 DIAGNOSIS — N951 Menopausal and female climacteric states: Secondary | ICD-10-CM | POA: Diagnosis not present

## 2016-05-24 NOTE — Op Note (Signed)
NAME:  Colleen Schmitt, Colleen Schmitt                   ACCOUNT NO.:  MEDICAL RECORD NO.:  [id number]  LOCATION:                                 FACILITY:  PHYSICIAN:  Satira Anis. Amedeo Plenty, M.D.     DATE OF BIRTH:  DATE OF PROCEDURE: DATE OF DISCHARGE:                              OPERATIVE REPORT   PREOPERATIVE DIAGNOSIS:  Right carpal tunnel syndrome.  POSTOPERATIVE DIAGNOSIS:  Right carpal tunnel syndrome.  PROCEDURE:  Right limited open carpal tunnel release.  SURGEON:  Satira Anis. Amedeo Plenty, M.D.  ASSISTANT:  None.  COMPLICATIONS:  None.  ANESTHESIA:  General.  TOURNIQUET TIME:  Less than 10 minutes.  INDICATIONS:  A pleasant 46 year old female, who has failed conservative management for carpal tunnel syndrome.  She has positive objective findings and desires to proceed.  OPERATION IN DETAIL:  The patient was seen by myself and Anesthesia, underwent a smooth induction of general anesthetic.  She was prepped and draped in usual sterile fashion with Betadine scrub and paint. Previously, I performed personally a Hibiclens pre-scrub.  Outline marks were made.  Tourniquet insufflated, time-out observed, and antibiotics were infusing in the form of vancomycin.  At this time, I then performed a 1 cm incision at the distal edge of transverse carpal ligament. Dissection was carried down.  Palmar fascia incised.  After this was complete, I then dissected distally and released the distal portion of the transverse carpal ligament.  Fat pad egressed.  Superficial palmar arch identified and protected.  Distal to proximal dissection was carried out until adequate room was available to slide the blunt tip parotidectomy scissors the proximal remaining way.  This allowed for release of the transverse carpal ligament and antebrachial fascia under direct vision.  I used 4.5 loupe aid with light magnification and there were no complicating features.  She was completely decompressed.  I deflated the  tourniquet, irrigated copiously and closed wound with Prolene 5 mL Sensorcaine without epinephrine was placed on wound for postop analgesia.  She is going to plan for elevate, move, massage fingers.  She had sterile dressing applied after wound closure and there were no complicating issues.  Following my procedure, Dr. Benjamine Mola is going to perform surgical intervention in the form of sinus surgery on this nice lady.  His note will of course be dictated under separate heading.  All questions have been addressed.  At the time of my exit from the first half of the procedure, she was doing beautifully.     Satira Anis. Amedeo Plenty, M.D.     [facility]  D:  05/21/2016  T:  05/21/2016  Job:  [id number]

## 2016-05-26 DIAGNOSIS — J301 Allergic rhinitis due to pollen: Secondary | ICD-10-CM | POA: Diagnosis not present

## 2016-05-26 DIAGNOSIS — J3081 Allergic rhinitis due to animal (cat) (dog) hair and dander: Secondary | ICD-10-CM | POA: Diagnosis not present

## 2016-05-26 DIAGNOSIS — J3089 Other allergic rhinitis: Secondary | ICD-10-CM | POA: Diagnosis not present

## 2016-05-27 DIAGNOSIS — Z008 Encounter for other general examination: Secondary | ICD-10-CM | POA: Diagnosis not present

## 2016-05-27 DIAGNOSIS — F329 Major depressive disorder, single episode, unspecified: Secondary | ICD-10-CM | POA: Diagnosis not present

## 2016-05-28 DIAGNOSIS — G5602 Carpal tunnel syndrome, left upper limb: Secondary | ICD-10-CM | POA: Diagnosis not present

## 2016-05-31 DIAGNOSIS — J3081 Allergic rhinitis due to animal (cat) (dog) hair and dander: Secondary | ICD-10-CM | POA: Diagnosis not present

## 2016-05-31 DIAGNOSIS — J3089 Other allergic rhinitis: Secondary | ICD-10-CM | POA: Diagnosis not present

## 2016-05-31 DIAGNOSIS — J301 Allergic rhinitis due to pollen: Secondary | ICD-10-CM | POA: Diagnosis not present

## 2016-06-04 DIAGNOSIS — G5601 Carpal tunnel syndrome, right upper limb: Secondary | ICD-10-CM | POA: Diagnosis not present

## 2016-06-10 DIAGNOSIS — J3081 Allergic rhinitis due to animal (cat) (dog) hair and dander: Secondary | ICD-10-CM | POA: Diagnosis not present

## 2016-06-10 DIAGNOSIS — J3089 Other allergic rhinitis: Secondary | ICD-10-CM | POA: Diagnosis not present

## 2016-06-10 DIAGNOSIS — J301 Allergic rhinitis due to pollen: Secondary | ICD-10-CM | POA: Diagnosis not present

## 2016-06-14 DIAGNOSIS — J3081 Allergic rhinitis due to animal (cat) (dog) hair and dander: Secondary | ICD-10-CM | POA: Diagnosis not present

## 2016-06-14 DIAGNOSIS — J301 Allergic rhinitis due to pollen: Secondary | ICD-10-CM | POA: Diagnosis not present

## 2016-06-14 DIAGNOSIS — Z008 Encounter for other general examination: Secondary | ICD-10-CM | POA: Diagnosis not present

## 2016-06-14 DIAGNOSIS — J3089 Other allergic rhinitis: Secondary | ICD-10-CM | POA: Diagnosis not present

## 2016-06-14 DIAGNOSIS — F329 Major depressive disorder, single episode, unspecified: Secondary | ICD-10-CM | POA: Diagnosis not present

## 2016-06-21 DIAGNOSIS — J3089 Other allergic rhinitis: Secondary | ICD-10-CM | POA: Diagnosis not present

## 2016-06-21 DIAGNOSIS — J301 Allergic rhinitis due to pollen: Secondary | ICD-10-CM | POA: Diagnosis not present

## 2016-06-23 DIAGNOSIS — G5601 Carpal tunnel syndrome, right upper limb: Secondary | ICD-10-CM | POA: Diagnosis not present

## 2016-06-28 DIAGNOSIS — J3089 Other allergic rhinitis: Secondary | ICD-10-CM | POA: Diagnosis not present

## 2016-06-28 DIAGNOSIS — J3081 Allergic rhinitis due to animal (cat) (dog) hair and dander: Secondary | ICD-10-CM | POA: Diagnosis not present

## 2016-06-28 DIAGNOSIS — J301 Allergic rhinitis due to pollen: Secondary | ICD-10-CM | POA: Diagnosis not present

## 2016-06-28 DIAGNOSIS — F329 Major depressive disorder, single episode, unspecified: Secondary | ICD-10-CM | POA: Diagnosis not present

## 2016-06-29 DIAGNOSIS — F900 Attention-deficit hyperactivity disorder, predominantly inattentive type: Secondary | ICD-10-CM | POA: Diagnosis not present

## 2016-06-29 DIAGNOSIS — Z6829 Body mass index (BMI) 29.0-29.9, adult: Secondary | ICD-10-CM | POA: Diagnosis not present

## 2016-06-29 MED FILL — AMPHETAMINE SALTS 10 MG TAB: 10 | 30 days supply | Qty: 60 | Fill #0

## 2016-07-01 DIAGNOSIS — J301 Allergic rhinitis due to pollen: Secondary | ICD-10-CM | POA: Diagnosis not present

## 2016-07-01 MED FILL — MONTELUKAST SOD 10 MG TAB: 10 | 90 days supply | Qty: 90 | Fill #1

## 2016-07-08 DIAGNOSIS — J3081 Allergic rhinitis due to animal (cat) (dog) hair and dander: Secondary | ICD-10-CM | POA: Diagnosis not present

## 2016-07-08 DIAGNOSIS — J301 Allergic rhinitis due to pollen: Secondary | ICD-10-CM | POA: Diagnosis not present

## 2016-07-08 DIAGNOSIS — F329 Major depressive disorder, single episode, unspecified: Secondary | ICD-10-CM | POA: Diagnosis not present

## 2016-07-08 DIAGNOSIS — J3089 Other allergic rhinitis: Secondary | ICD-10-CM | POA: Diagnosis not present

## 2016-07-08 DIAGNOSIS — Z008 Encounter for other general examination: Secondary | ICD-10-CM | POA: Diagnosis not present

## 2016-07-15 DIAGNOSIS — J301 Allergic rhinitis due to pollen: Secondary | ICD-10-CM | POA: Diagnosis not present

## 2016-07-15 DIAGNOSIS — J3089 Other allergic rhinitis: Secondary | ICD-10-CM | POA: Diagnosis not present

## 2016-07-15 DIAGNOSIS — Z0000 Encounter for general adult medical examination without abnormal findings: Secondary | ICD-10-CM | POA: Diagnosis not present

## 2016-07-15 DIAGNOSIS — J3081 Allergic rhinitis due to animal (cat) (dog) hair and dander: Secondary | ICD-10-CM | POA: Diagnosis not present

## 2016-07-15 DIAGNOSIS — F329 Major depressive disorder, single episode, unspecified: Secondary | ICD-10-CM | POA: Diagnosis not present

## 2016-07-15 DIAGNOSIS — Z008 Encounter for other general examination: Secondary | ICD-10-CM | POA: Diagnosis not present

## 2016-07-16 DIAGNOSIS — F321 Major depressive disorder, single episode, moderate: Secondary | ICD-10-CM | POA: Diagnosis not present

## 2016-07-16 DIAGNOSIS — J302 Other seasonal allergic rhinitis: Secondary | ICD-10-CM | POA: Diagnosis not present

## 2016-07-16 DIAGNOSIS — F900 Attention-deficit hyperactivity disorder, predominantly inattentive type: Secondary | ICD-10-CM | POA: Diagnosis not present

## 2016-07-16 DIAGNOSIS — K219 Gastro-esophageal reflux disease without esophagitis: Secondary | ICD-10-CM | POA: Diagnosis not present

## 2016-07-16 DIAGNOSIS — Z23 Encounter for immunization: Secondary | ICD-10-CM | POA: Diagnosis not present

## 2016-07-16 DIAGNOSIS — D508 Other iron deficiency anemias: Secondary | ICD-10-CM | POA: Diagnosis not present

## 2016-07-16 DIAGNOSIS — J4530 Mild persistent asthma, uncomplicated: Secondary | ICD-10-CM | POA: Diagnosis not present

## 2016-07-16 DIAGNOSIS — Z9884 Bariatric surgery status: Secondary | ICD-10-CM | POA: Diagnosis not present

## 2016-07-16 DIAGNOSIS — Z0000 Encounter for general adult medical examination without abnormal findings: Secondary | ICD-10-CM | POA: Diagnosis not present

## 2016-07-16 DIAGNOSIS — E668 Other obesity: Secondary | ICD-10-CM | POA: Diagnosis not present

## 2016-07-16 DIAGNOSIS — Z1389 Encounter for screening for other disorder: Secondary | ICD-10-CM | POA: Diagnosis not present

## 2016-07-19 DIAGNOSIS — Z008 Encounter for other general examination: Secondary | ICD-10-CM | POA: Diagnosis not present

## 2016-07-19 DIAGNOSIS — J3089 Other allergic rhinitis: Secondary | ICD-10-CM | POA: Diagnosis not present

## 2016-07-19 DIAGNOSIS — J301 Allergic rhinitis due to pollen: Secondary | ICD-10-CM | POA: Diagnosis not present

## 2016-07-19 DIAGNOSIS — J3081 Allergic rhinitis due to animal (cat) (dog) hair and dander: Secondary | ICD-10-CM | POA: Diagnosis not present

## 2016-07-19 DIAGNOSIS — F329 Major depressive disorder, single episode, unspecified: Secondary | ICD-10-CM | POA: Diagnosis not present

## 2016-07-21 DIAGNOSIS — J301 Allergic rhinitis due to pollen: Secondary | ICD-10-CM | POA: Diagnosis not present

## 2016-07-21 DIAGNOSIS — J3089 Other allergic rhinitis: Secondary | ICD-10-CM | POA: Diagnosis not present

## 2016-07-21 DIAGNOSIS — J3081 Allergic rhinitis due to animal (cat) (dog) hair and dander: Secondary | ICD-10-CM | POA: Diagnosis not present

## 2016-07-26 DIAGNOSIS — F329 Major depressive disorder, single episode, unspecified: Secondary | ICD-10-CM | POA: Diagnosis not present

## 2016-07-26 DIAGNOSIS — J3081 Allergic rhinitis due to animal (cat) (dog) hair and dander: Secondary | ICD-10-CM | POA: Diagnosis not present

## 2016-07-26 DIAGNOSIS — J301 Allergic rhinitis due to pollen: Secondary | ICD-10-CM | POA: Diagnosis not present

## 2016-07-26 DIAGNOSIS — Z008 Encounter for other general examination: Secondary | ICD-10-CM | POA: Diagnosis not present

## 2016-07-26 DIAGNOSIS — J3089 Other allergic rhinitis: Secondary | ICD-10-CM | POA: Diagnosis not present

## 2016-07-27 MED FILL — DOXYCYCLINE HYCLATE 100 MG: 100 | 90 days supply | Qty: 180 | Fill #1

## 2016-07-27 MED FILL — DESLORATADINE 5 MG TABLET: 5 | 90 days supply | Qty: 90 | Fill #0

## 2016-07-28 DIAGNOSIS — J3089 Other allergic rhinitis: Secondary | ICD-10-CM | POA: Diagnosis not present

## 2016-07-28 DIAGNOSIS — J3081 Allergic rhinitis due to animal (cat) (dog) hair and dander: Secondary | ICD-10-CM | POA: Diagnosis not present

## 2016-07-28 DIAGNOSIS — J301 Allergic rhinitis due to pollen: Secondary | ICD-10-CM | POA: Diagnosis not present

## 2016-08-02 DIAGNOSIS — J3081 Allergic rhinitis due to animal (cat) (dog) hair and dander: Secondary | ICD-10-CM | POA: Diagnosis not present

## 2016-08-02 DIAGNOSIS — J3089 Other allergic rhinitis: Secondary | ICD-10-CM | POA: Diagnosis not present

## 2016-08-02 DIAGNOSIS — Z008 Encounter for other general examination: Secondary | ICD-10-CM | POA: Diagnosis not present

## 2016-08-02 DIAGNOSIS — F329 Major depressive disorder, single episode, unspecified: Secondary | ICD-10-CM | POA: Diagnosis not present

## 2016-08-02 DIAGNOSIS — J301 Allergic rhinitis due to pollen: Secondary | ICD-10-CM | POA: Diagnosis not present

## 2016-08-05 MED FILL — DEXTROAMP-AMP 10 MG TAB: 10 | 30 days supply | Qty: 60 | Fill #0

## 2016-08-09 DIAGNOSIS — Z008 Encounter for other general examination: Secondary | ICD-10-CM | POA: Diagnosis not present

## 2016-08-09 DIAGNOSIS — J3089 Other allergic rhinitis: Secondary | ICD-10-CM | POA: Diagnosis not present

## 2016-08-09 DIAGNOSIS — J301 Allergic rhinitis due to pollen: Secondary | ICD-10-CM | POA: Diagnosis not present

## 2016-08-09 DIAGNOSIS — J3081 Allergic rhinitis due to animal (cat) (dog) hair and dander: Secondary | ICD-10-CM | POA: Diagnosis not present

## 2016-08-09 DIAGNOSIS — F329 Major depressive disorder, single episode, unspecified: Secondary | ICD-10-CM | POA: Diagnosis not present

## 2016-08-12 MED FILL — FLUOCINONIDE 0.05% GEL: 0.05 | 10 days supply | Qty: 60 | Fill #0

## 2016-08-16 DIAGNOSIS — J301 Allergic rhinitis due to pollen: Secondary | ICD-10-CM | POA: Diagnosis not present

## 2016-08-16 DIAGNOSIS — J3089 Other allergic rhinitis: Secondary | ICD-10-CM | POA: Diagnosis not present

## 2016-08-16 DIAGNOSIS — J3081 Allergic rhinitis due to animal (cat) (dog) hair and dander: Secondary | ICD-10-CM | POA: Diagnosis not present

## 2016-08-17 MED FILL — VENTOLIN HFA 90 MCG INHALER: 108 (90 BAS | 16 days supply | Qty: 18 | Fill #0

## 2016-08-27 DIAGNOSIS — J3089 Other allergic rhinitis: Secondary | ICD-10-CM | POA: Diagnosis not present

## 2016-08-27 DIAGNOSIS — J3081 Allergic rhinitis due to animal (cat) (dog) hair and dander: Secondary | ICD-10-CM | POA: Diagnosis not present

## 2016-08-27 DIAGNOSIS — J301 Allergic rhinitis due to pollen: Secondary | ICD-10-CM | POA: Diagnosis not present

## 2016-08-30 DIAGNOSIS — F329 Major depressive disorder, single episode, unspecified: Secondary | ICD-10-CM | POA: Diagnosis not present

## 2016-08-30 DIAGNOSIS — Z008 Encounter for other general examination: Secondary | ICD-10-CM | POA: Diagnosis not present

## 2016-09-02 MED FILL — AMPHETAMINE SALTS 10 MG TAB: 10 | 30 days supply | Qty: 60 | Fill #0

## 2016-09-03 DIAGNOSIS — T07XXXA Unspecified multiple injuries, initial encounter: Secondary | ICD-10-CM | POA: Diagnosis not present

## 2016-09-03 DIAGNOSIS — Z85828 Personal history of other malignant neoplasm of skin: Secondary | ICD-10-CM | POA: Diagnosis not present

## 2016-09-03 DIAGNOSIS — S20369S Insect bite (nonvenomous) of unspecified front wall of thorax, sequela: Secondary | ICD-10-CM | POA: Diagnosis not present

## 2016-09-03 DIAGNOSIS — L298 Other pruritus: Secondary | ICD-10-CM | POA: Diagnosis not present

## 2016-09-03 MED FILL — BETAMETHASONE DP 0.05% CRM: 0.05 | 14 days supply | Qty: 45 | Fill #0

## 2016-09-10 ENCOUNTER — Ambulatory Visit: Payer: Self-pay | Admitting: Orthopedic Surgery

## 2016-09-16 DIAGNOSIS — F329 Major depressive disorder, single episode, unspecified: Secondary | ICD-10-CM | POA: Diagnosis not present

## 2016-09-16 DIAGNOSIS — Z008 Encounter for other general examination: Secondary | ICD-10-CM | POA: Diagnosis not present

## 2016-09-16 MED FILL — BETAMETHASONE DP 0.05% CRM: 0.05 | 14 days supply | Qty: 45 | Fill #1

## 2016-09-17 DIAGNOSIS — R21 Rash and other nonspecific skin eruption: Secondary | ICD-10-CM | POA: Diagnosis not present

## 2016-09-17 DIAGNOSIS — Z6828 Body mass index (BMI) 28.0-28.9, adult: Secondary | ICD-10-CM | POA: Diagnosis not present

## 2016-09-20 ENCOUNTER — Encounter (HOSPITAL_BASED_OUTPATIENT_CLINIC_OR_DEPARTMENT_OTHER): Payer: Self-pay | Admitting: *Deleted

## 2016-09-20 DIAGNOSIS — J3081 Allergic rhinitis due to animal (cat) (dog) hair and dander: Secondary | ICD-10-CM | POA: Diagnosis not present

## 2016-09-20 DIAGNOSIS — J301 Allergic rhinitis due to pollen: Secondary | ICD-10-CM | POA: Diagnosis not present

## 2016-09-20 DIAGNOSIS — Z008 Encounter for other general examination: Secondary | ICD-10-CM | POA: Diagnosis not present

## 2016-09-20 DIAGNOSIS — F329 Major depressive disorder, single episode, unspecified: Secondary | ICD-10-CM | POA: Diagnosis not present

## 2016-09-20 DIAGNOSIS — J3089 Other allergic rhinitis: Secondary | ICD-10-CM | POA: Diagnosis not present

## 2016-09-21 DIAGNOSIS — L700 Acne vulgaris: Secondary | ICD-10-CM | POA: Diagnosis not present

## 2016-09-21 DIAGNOSIS — Z85828 Personal history of other malignant neoplasm of skin: Secondary | ICD-10-CM | POA: Diagnosis not present

## 2016-09-21 MED FILL — AMPHETAMINE SALTS 20 MG TAB: 20 | 30 days supply | Qty: 60 | Fill #0

## 2016-09-22 MED FILL — MONTELUKAST SOD 10 MG TAB: 10 | 90 days supply | Qty: 90 | Fill #2

## 2016-09-24 ENCOUNTER — Ambulatory Visit (HOSPITAL_BASED_OUTPATIENT_CLINIC_OR_DEPARTMENT_OTHER): Payer: 59 | Admitting: Anesthesiology

## 2016-09-24 ENCOUNTER — Ambulatory Visit (HOSPITAL_BASED_OUTPATIENT_CLINIC_OR_DEPARTMENT_OTHER)
Admission: RE | Admit: 2016-09-24 | Discharge: 2016-09-24 | Disposition: A | Discharge status: Home / Self Care | Payer: 59 | Source: Ambulatory Visit | Attending: Orthopedic Surgery | Admitting: Orthopedic Surgery

## 2016-09-24 ENCOUNTER — Encounter (HOSPITAL_BASED_OUTPATIENT_CLINIC_OR_DEPARTMENT_OTHER)
Admission: RE | Disposition: A | Discharge status: Home / Self Care | Payer: Self-pay | Source: Ambulatory Visit | Attending: Orthopedic Surgery

## 2016-09-24 ENCOUNTER — Encounter (HOSPITAL_BASED_OUTPATIENT_CLINIC_OR_DEPARTMENT_OTHER): Payer: Self-pay

## 2016-09-24 DIAGNOSIS — K219 Gastro-esophageal reflux disease without esophagitis: Secondary | ICD-10-CM | POA: Diagnosis not present

## 2016-09-24 DIAGNOSIS — F419 Anxiety disorder, unspecified: Secondary | ICD-10-CM | POA: Insufficient documentation

## 2016-09-24 DIAGNOSIS — J45909 Unspecified asthma, uncomplicated: Secondary | ICD-10-CM | POA: Insufficient documentation

## 2016-09-24 DIAGNOSIS — F418 Other specified anxiety disorders: Secondary | ICD-10-CM | POA: Diagnosis not present

## 2016-09-24 DIAGNOSIS — G5602 Carpal tunnel syndrome, left upper limb: Secondary | ICD-10-CM | POA: Diagnosis not present

## 2016-09-24 DIAGNOSIS — Z880 Allergy status to penicillin: Secondary | ICD-10-CM | POA: Diagnosis not present

## 2016-09-24 DIAGNOSIS — Z79899 Other long term (current) drug therapy: Secondary | ICD-10-CM | POA: Insufficient documentation

## 2016-09-24 DIAGNOSIS — F329 Major depressive disorder, single episode, unspecified: Secondary | ICD-10-CM | POA: Insufficient documentation

## 2016-09-24 DIAGNOSIS — F909 Attention-deficit hyperactivity disorder, unspecified type: Secondary | ICD-10-CM | POA: Diagnosis not present

## 2016-09-24 HISTORY — PX: CARPAL TUNNEL RELEASE: SHX101

## 2016-09-24 SURGERY — CARPAL TUNNEL RELEASE
Anesthesia: Monitor Anesthesia Care | Site: Hand | Laterality: Left

## 2016-09-24 MED ORDER — LACTATED RINGERS IV SOLN
INTRAVENOUS | Status: DC
  Administered 2016-09-24: 09:00:00 via INTRAVENOUS

## 2016-09-24 MED ORDER — LIDOCAINE HCL (PF) 1 % IJ SOLN
INTRAMUSCULAR | Status: AC
  Filled 2016-09-24: qty 30

## 2016-09-24 MED ORDER — BUPIVACAINE HCL (PF) 0.5 % IJ SOLN
INTRAMUSCULAR | Status: DC | PRN
  Administered 2016-09-24: 10 mL

## 2016-09-24 MED ORDER — OXYCODONE HCL 5 MG PO TABS: 5.0000 mg | ORAL_TABLET | Freq: Once | ORAL | Status: DC | PRN

## 2016-09-24 MED ORDER — LACTATED RINGERS IV SOLN: INTRAVENOUS | Status: DC

## 2016-09-24 MED ORDER — CHLORHEXIDINE GLUCONATE 4 % EX LIQD: 60.0000 mL | Freq: Once | CUTANEOUS | Status: DC

## 2016-09-24 MED ORDER — OXYCODONE HCL 5 MG/5ML PO SOLN: 5.0000 mg | Freq: Once | ORAL | Status: DC | PRN

## 2016-09-24 MED ORDER — MIDAZOLAM HCL 5 MG/5ML IJ SOLN
INTRAMUSCULAR | Status: DC | PRN
  Administered 2016-09-24: 1 mg via INTRAVENOUS

## 2016-09-24 MED ORDER — PROPOFOL 10 MG/ML IV BOLUS
INTRAVENOUS | Status: AC
  Filled 2016-09-24: qty 20

## 2016-09-24 MED ORDER — ONDANSETRON HCL 4 MG/2ML IJ SOLN
INTRAMUSCULAR | Status: DC | PRN
  Administered 2016-09-24: 4 mg via INTRAVENOUS

## 2016-09-24 MED ORDER — DEXAMETHASONE SODIUM PHOSPHATE 10 MG/ML IJ SOLN
INTRAMUSCULAR | Status: AC
  Filled 2016-09-24: qty 1

## 2016-09-24 MED ORDER — MIDAZOLAM HCL 2 MG/2ML IJ SOLN
INTRAMUSCULAR | Status: AC
  Filled 2016-09-24: qty 2

## 2016-09-24 MED ORDER — LIDOCAINE HCL (PF) 1 % IJ SOLN
INTRAMUSCULAR | Status: AC
  Filled 2016-09-24: qty 5

## 2016-09-24 MED ORDER — PROMETHAZINE HCL 25 MG/ML IJ SOLN: 6.2500 mg | INTRAMUSCULAR | Status: DC | PRN

## 2016-09-24 MED ORDER — LIDOCAINE HCL 1 % IJ SOLN
INTRAMUSCULAR | Status: DC | PRN
  Administered 2016-09-24: 10 mL

## 2016-09-24 MED ORDER — SODIUM BICARBONATE 4 % IV SOLN
INTRAVENOUS | Status: AC
  Filled 2016-09-24: qty 5

## 2016-09-24 MED ORDER — SCOPOLAMINE 1 MG/3DAYS TD PT72: 1.0000 | MEDICATED_PATCH | Freq: Once | TRANSDERMAL | Status: DC | PRN

## 2016-09-24 MED ORDER — ONDANSETRON HCL 4 MG/2ML IJ SOLN
INTRAMUSCULAR | Status: AC
  Filled 2016-09-24: qty 2

## 2016-09-24 MED ORDER — VANCOMYCIN HCL IN DEXTROSE 1-5 GM/200ML-% IV SOLN
1000.0000 mg | INTRAVENOUS | Status: AC
  Administered 2016-09-24: 1000 mg via INTRAVENOUS

## 2016-09-24 MED ORDER — FENTANYL CITRATE (PF) 100 MCG/2ML IJ SOLN
INTRAMUSCULAR | Status: AC
  Filled 2016-09-24: qty 2

## 2016-09-24 MED ORDER — MEPERIDINE HCL 25 MG/ML IJ SOLN: 6.2500 mg | INTRAMUSCULAR | Status: DC | PRN

## 2016-09-24 MED ORDER — LIDOCAINE 2% (20 MG/ML) 5 ML SYRINGE
INTRAMUSCULAR | Status: AC
  Filled 2016-09-24: qty 5

## 2016-09-24 MED ORDER — FENTANYL CITRATE (PF) 100 MCG/2ML IJ SOLN: 25.0000 ug | INTRAMUSCULAR | Status: DC | PRN

## 2016-09-24 MED ORDER — VANCOMYCIN HCL IN DEXTROSE 1-5 GM/200ML-% IV SOLN
INTRAVENOUS | Status: AC
  Filled 2016-09-24: qty 200

## 2016-09-24 MED ORDER — MIDAZOLAM HCL 2 MG/2ML IJ SOLN: 1.0000 mg | INTRAMUSCULAR | Status: DC | PRN

## 2016-09-24 MED ORDER — SODIUM BICARBONATE 4 % IV SOLN
INTRAVENOUS | Status: DC | PRN
  Administered 2016-09-24: 2 mL via SUBCUTANEOUS

## 2016-09-24 MED ORDER — FENTANYL CITRATE (PF) 100 MCG/2ML IJ SOLN
INTRAMUSCULAR | Status: DC | PRN
  Administered 2016-09-24: 50 ug via INTRAVENOUS

## 2016-09-24 MED ORDER — FENTANYL CITRATE (PF) 100 MCG/2ML IJ SOLN: 50.0000 ug | INTRAMUSCULAR | Status: DC | PRN

## 2016-09-24 MED ORDER — DEXAMETHASONE SODIUM PHOSPHATE 4 MG/ML IJ SOLN
INTRAMUSCULAR | Status: DC | PRN
  Administered 2016-09-24: 4 mg via INTRAVENOUS

## 2016-09-24 MED FILL — DEXILANT DR 60 MG CAPSULE: 60 | 90 days supply | Qty: 90 | Fill #1

## 2016-09-24 SURGICAL SUPPLY — 46 items
BANDAGE ACE 3X5.8 VEL STRL LF (GAUZE/BANDAGES/DRESSINGS) ×2 IMPLANT
BANDAGE ACE 4X5 VEL STRL LF (GAUZE/BANDAGES/DRESSINGS) ×2 IMPLANT
BLADE CARPAL TUNNEL SNGL USE (BLADE) ×2 IMPLANT
BLADE SURG 15 STRL LF DISP TIS (BLADE) ×2 IMPLANT
BLADE SURG 15 STRL SS (BLADE) ×2
BNDG CONFORM 3 STRL LF (GAUZE/BANDAGES/DRESSINGS) ×2 IMPLANT
BNDG CONFORM 4 STRL LF (GAUZE/BANDAGES/DRESSINGS) ×2 IMPLANT
BRUSH SCRUB EZ PLAIN DRY (MISCELLANEOUS) ×2 IMPLANT
CORD BIPOLAR FORCEPS 12FT (ELECTRODE) ×2 IMPLANT
COVER BACK TABLE 60X90IN (DRAPES) ×2 IMPLANT
CUFF TOURNIQUET SINGLE 18IN (TOURNIQUET CUFF) ×2 IMPLANT
DRAIN PENROSE 1/4X12 LTX STRL (WOUND CARE) IMPLANT
DRAPE EXTREMITY T 121X128X90 (DRAPE) ×2 IMPLANT
DRAPE SURG 17X23 STRL (DRAPES) ×2 IMPLANT
DRSG EMULSION OIL 3X3 NADH (GAUZE/BANDAGES/DRESSINGS) ×2 IMPLANT
GAUZE SPONGE 4X4 12PLY STRL (GAUZE/BANDAGES/DRESSINGS) ×2 IMPLANT
GAUZE SPONGE 4X4 16PLY XRAY LF (GAUZE/BANDAGES/DRESSINGS) IMPLANT
GAUZE XEROFORM 1X8 LF (GAUZE/BANDAGES/DRESSINGS) ×2 IMPLANT
GLOVE BIOGEL M STRL SZ7.5 (GLOVE) IMPLANT
GLOVE SS BIOGEL STRL SZ 8 (GLOVE) ×1 IMPLANT
GLOVE SUPERSENSE BIOGEL SZ 8 (GLOVE) ×1
GOWN STRL REUS W/ TWL LRG LVL3 (GOWN DISPOSABLE) ×1 IMPLANT
GOWN STRL REUS W/ TWL XL LVL3 (GOWN DISPOSABLE) ×1 IMPLANT
GOWN STRL REUS W/TWL LRG LVL3 (GOWN DISPOSABLE) ×1
GOWN STRL REUS W/TWL XL LVL3 (GOWN DISPOSABLE) ×1
LOOP VESSEL MAXI BLUE (MISCELLANEOUS) IMPLANT
NDL SAFETY ECLIPSE 18X1.5 (NEEDLE) ×1 IMPLANT
NEEDLE HYPO 18GX1.5 SHARP (NEEDLE) ×1
NEEDLE HYPO 22GX1.5 SAFETY (NEEDLE) IMPLANT
NEEDLE HYPO 25X1 1.5 SAFETY (NEEDLE) ×4 IMPLANT
NS IRRIG 1000ML POUR BTL (IV SOLUTION) ×2 IMPLANT
PACK BASIN DAY SURGERY FS (CUSTOM PROCEDURE TRAY) ×2 IMPLANT
PAD ALCOHOL SWAB (MISCELLANEOUS) ×16 IMPLANT
PAD CAST 3X4 CTTN HI CHSV (CAST SUPPLIES) ×2 IMPLANT
PADDING CAST ABS 4INX4YD NS (CAST SUPPLIES) ×1
PADDING CAST ABS COTTON 4X4 ST (CAST SUPPLIES) ×1 IMPLANT
PADDING CAST COTTON 3X4 STRL (CAST SUPPLIES) ×2
SHEET MEDIUM DRAPE 40X70 STRL (DRAPES) ×2 IMPLANT
STOCKINETTE 4X48 STRL (DRAPES) ×2 IMPLANT
SUT PROLENE 4 0 PS 2 18 (SUTURE) ×2 IMPLANT
SYR BULB 3OZ (MISCELLANEOUS) ×2 IMPLANT
SYR CONTROL 10ML LL (SYRINGE) ×4 IMPLANT
TOWEL OR 17X24 6PK STRL BLUE (TOWEL DISPOSABLE) ×2 IMPLANT
TOWEL OR NON WOVEN STRL DISP B (DISPOSABLE) ×2 IMPLANT
TRAY DSU PREP LF (CUSTOM PROCEDURE TRAY) ×2 IMPLANT
UNDERPAD 30X30 (UNDERPADS AND DIAPERS) ×2 IMPLANT

## 2016-09-24 NOTE — Anesthesia Postprocedure Evaluation (Signed)
Anesthesia Post Note  Patient: Colleen Schmitt  Procedure(s) Performed: Procedure(s) (LRB): Limited open CARPAL TUNNEL RELEASE (Left)     Patient location during evaluation: PACU Anesthesia Type: MAC Level of consciousness: awake and alert Pain management: pain level controlled Vital Signs Assessment: post-procedure vital signs reviewed and stable Respiratory status: spontaneous breathing, nonlabored ventilation, respiratory function stable and patient connected to nasal cannula oxygen Cardiovascular status: stable and blood pressure returned to baseline Postop Assessment: no apparent nausea or vomiting Anesthetic complications: no    Last Vitals:  Vitals:   09/24/16 1130 09/24/16 1200  BP: (!) 128/91 (!) 117/92  Pulse: 62 63  Resp: 15 16  Temp:  36.7 C  SpO2: 99% 99%    Last Pain:  Vitals:   09/24/16 1130  TempSrc:   PainSc: 0-No pain                 Hazem Kenner,JAMES TERRILL

## 2016-09-24 NOTE — Discharge Instructions (Signed)
°  You did great Gertrue-remember to elevate moving the size and if fingers will be somewhat none for at least 24 hours or so!  Keep bandage clean and dry.  Call for any problems.  No smoking.  Criteria for driving a car: you should be off your pain medicine for 7-8 hours, able to drive one handed(confident), thinking clearly and feeling able in your judgement to drive. Continue elevation as it will decrease swelling.  If instructed by MD move your fingers within the confines of the bandage/splint.  Use ice if instructed by your MD. Call immediately for any sudden loss of feeling in your hand/arm or change in functional abilities of the extremity.We recommend that you to take vitamin C 1000 mg a day to promote healing. We also recommend that if you require  pain medicine that you take a stool softener to prevent constipation as most pain medicines will have constipation side effects. We recommend either Peri-Colace or Senokot and recommend that you also consider adding MiraLAX as well to prevent the constipation affects from pain medicine if you are required to use them. These medicines are over the counter and may be purchased at a local pharmacy. A cup of yogurt and a probiotic can also be helpful during the recovery process as the medicines can disrupt your intestinal environment.    Call your surgeon if you experience:   1.  Fever over 101.0. 2.  Inability to urinate. 3.  Nausea and/or vomiting. 4.  Extreme swelling or bruising at the surgical site. 5.  Continued bleeding from the incision. 6.  Increased pain, redness or drainage from the incision. 7.  Problems related to your pain medication. 8.  Any problems and/or concerns    Post Anesthesia Home Care Instructions  Activity: Get plenty of rest for the remainder of the day. A responsible individual must stay with you for 24 hours following the procedure.  For the next 24 hours, DO NOT: -Drive a car -Paediatric nurse -Drink alcoholic  beverages -Take any medication unless instructed by your physician -Make any legal decisions or sign important papers.  Meals: Start with liquid foods such as gelatin or soup. Progress to regular foods as tolerated. Avoid greasy, spicy, heavy foods. If nausea and/or vomiting occur, drink only clear liquids until the nausea and/or vomiting subsides. Call your physician if vomiting continues.  Special Instructions/Symptoms: Your throat may feel dry or sore from the anesthesia or the breathing tube placed in your throat during surgery. If this causes discomfort, gargle with warm salt water. The discomfort should disappear within 24 hours.  If you had a scopolamine patch placed behind your ear for the management of post- operative nausea and/or vomiting:  1. The medication in the patch is effective for 72 hours, after which it should be removed.  Wrap patch in a tissue and discard in the trash. Wash hands thoroughly with soap and water. 2. You may remove the patch earlier than 72 hours if you experience unpleasant side effects which may include dry mouth, dizziness or visual disturbances. 3. Avoid touching the patch. Wash your hands with soap and water after contact with the patch.

## 2016-09-24 NOTE — Anesthesia Preprocedure Evaluation (Signed)
Anesthesia Evaluation  Patient identified by MRN, date of birth, ID band Patient awake    Reviewed: Allergy & Precautions, NPO status , Patient's Chart, lab work & pertinent test results  Airway Mallampati: I  TM Distance: >3 FB Neck ROM: Full    Dental  (+) Teeth Intact, Dental Advisory Given   Pulmonary asthma ,    breath sounds clear to auscultation       Cardiovascular negative cardio ROS   Rhythm:Regular Rate:Normal     Neuro/Psych Anxiety Depression negative neurological ROS     GI/Hepatic Neg liver ROS, GERD  Controlled,  Endo/Other  negative endocrine ROS  Renal/GU negative Renal ROS     Musculoskeletal negative musculoskeletal ROS (+)   Abdominal   Peds  Hematology negative hematology ROS (+)   Anesthesia Other Findings Day of surgery medications reviewed with the patient.  Reproductive/Obstetrics                             Anesthesia Physical Anesthesia Plan  ASA: II  Anesthesia Plan: MAC   Post-op Pain Management:    Induction: Intravenous  PONV Risk Score and Plan: 3 and Ondansetron, Midazolam, Propofol infusion and Treatment may vary due to age or medical condition  Airway Management Planned: Natural Airway  Additional Equipment:   Intra-op Plan:   Post-operative Plan:   Informed Consent: I have reviewed the patients History and Physical, chart, labs and discussed the procedure including the risks, benefits and alternatives for the proposed anesthesia with the patient or authorized representative who has indicated his/her understanding and acceptance.     Plan Discussed with: CRNA  Anesthesia Plan Comments:         Anesthesia Quick Evaluation

## 2016-09-24 NOTE — Anesthesia Procedure Notes (Signed)
Procedure Name: MAC Date/Time: 09/24/2016 10:26 AM Performed by: Rica Koyanagi Pre-anesthesia Checklist: Patient identified, Emergency Drugs available, Suction available, Patient being monitored and Timeout performed Patient Re-evaluated:Patient Re-evaluated prior to induction Oxygen Delivery Method: Nasal cannula Placement Confirmation: CO2 detector and breath sounds checked- equal and bilateral

## 2016-09-24 NOTE — Op Note (Signed)
See dictation#[id number]  SP L CTR  Kerstyn Coryell MD

## 2016-09-24 NOTE — Transfer of Care (Signed)
Last Vitals:  Vitals:   09/24/16 0828  BP: 110/75  Pulse: 61  Resp: 18  Temp: 36.8 C  SpO2: 98%    Last Pain:  Vitals:   09/24/16 0828  TempSrc: Oral  PainSc: 2       Patients Stated Pain Goal: 1 (09/24/16 5953)  Immediate Anesthesia Transfer of Care Note  Patient: Colleen Schmitt  Procedure(s) Performed: Procedure(s) (LRB): Limited open CARPAL TUNNEL RELEASE (Left)  Patient Location: PACU  Anesthesia Type: General  Level of Consciousness: awake, alert  and oriented  Airway & Oxygen Therapy: Patient Spontanous Breathing and Patient connected to nasal cannula oxygen  Post-op Assessment: Report given to PACU RN and Post -op Vital signs reviewed and stable  Post vital signs: Reviewed and stable  Complications: No apparent anesthesia complications

## 2016-09-24 NOTE — H&P (Signed)
Colleen Schmitt is an 46 y.o. female.   Chief Complaint: Left carpal tunnel syndrome HPI: Patient presents for evaluation and treatment of the of their upper extremity predicament. The patient denies neck, back, chest or  abdominal pain. The patient notes that they have no lower extremity problems. The patients primary complaint is noted. We are planning surgical care pathway for the upper extremity.  Past Medical History:  Diagnosis Date  . Abdominal pain   . ADHD   . Anxiety   . Asthma   . Contact lens/glasses fitting   . Cough   . Depression   . Fatigue   . GERD (gastroesophageal reflux disease)   . Hoarseness   . Poor appetite     Past Surgical History:  Procedure Laterality Date  . CARPAL TUNNEL RELEASE Right 05/21/2016   Procedure: CARPAL TUNNEL RELEASE;  Surgeon: Roseanne Kaufman, MD;  Location: [facility];  Service: Orthopedics;  Laterality: Right;  Requests 1 hr  . lap band  02/14/2006  . NASAL SEPTOPLASTY W/ TURBINOPLASTY N/A 05/21/2016   Procedure: NASAL SEPTOPLASTY WITH TURBINATE REDUCTION;  Surgeon: Leta Baptist, MD;  Location: [facility];  Service: ENT;  Laterality: N/A;  . ORIF FEMUR FRACTURE  1993   right    Family History  Problem Relation Age of Onset  . Cancer Paternal Grandfather        throat  . Osteoporosis Mother   . Hypertension Father   . Atrial fibrillation Father   . Hypertension Sister   . Mitral valve prolapse Sister    Social History:  reports that she has never smoked. She has never used smokeless tobacco. She reports that she drinks alcohol. She reports that she does not use drugs.  Allergies:  Allergies  Allergen Reactions  . Adhesive [Tape] Rash  . Penicillins Itching    Palms of hands    Medications Prior to Admission  Medication Sig Dispense Refill  . albuterol (PROVENTIL HFA;VENTOLIN HFA) 108 (90 Base) MCG/ACT inhaler Inhale 2 puffs into the lungs every 6 (six) hours as needed for wheezing or shortness of  breath.    . amphetamine-dextroamphetamine (ADDERALL) 10 MG tablet Take 10 mg by mouth every evening.    Marland Kitchen amphetamine-dextroamphetamine (ADDERALL) 20 MG tablet Take 20 mg by mouth daily.    Marland Kitchen desloratadine (CLARINEX) 5 MG tablet Take 5 mg by mouth daily.    Marland Kitchen dexlansoprazole (DEXILANT) 60 MG capsule Take 60 mg by mouth daily.    Marland Kitchen doxycycline (VIBRA-TABS) 100 MG tablet Take 100 mg by mouth daily.    . montelukast (SINGULAIR) 10 MG tablet Take 10 mg by mouth at bedtime.    . triamcinolone (NASACORT) 55 MCG/ACT AERO nasal inhaler Place 2 sprays into the nose daily.    . Vilazodone HCl (VIIBRYD) 20 MG TABS Take by mouth daily.      No results found for this or any previous visit (from the past 48 hour(s)). No results found.  Review of Systems  Respiratory: Negative.   Cardiovascular: Negative.   Gastrointestinal: Negative.   Genitourinary: Negative.     Blood pressure 110/75, pulse 61, temperature 98.2 F (36.8 C), temperature source Oral, resp. rate 18, height 5\' 8"  (1.727 m), weight 82.6 kg (182 lb), last menstrual period 02/21/2016, SpO2 98 %. Physical Exam patient exhibits left carpal tunnel syndrome with positive Tinel's and median nerve compression test The patient is alert and oriented in no acute distress. The patient complains of pain in the affected  upper extremity.  The patient is noted to have a normal HEENT exam. Lung fields show equal chest expansion and no shortness of breath. Abdomen exam is nontender without distention. Lower extremity examination does not show any fracture dislocation or blood clot symptoms. Pelvis is stable and the neck and back are stable and nontender. Assessment/Plan Plan left carpal tunnel release  We are planning surgery for your upper extremity. The risk and benefits of surgery to include risk of bleeding, infection, anesthesia,  damage to normal structures and failure of the surgery to accomplish its intended goals of relieving symptoms and  restoring function have been discussed in detail. With this in mind we plan to proceed. I have specifically discussed with the patient the pre-and postoperative regime and the dos and don'ts and risk and benefits in great detail. Risk and benefits of surgery also include risk of dystrophy(CRPS), chronic nerve pain, failure of the healing process to go onto completion and other inherent risks of surgery The relavent the pathophysiology of the disease/injury process, as well as the alternatives for treatment and postoperative course of action has been discussed in great detail with the patient who desires to proceed.  We will do everything in our power to help you (the patient) restore function to the upper extremity. It is a pleasure to see this patient today.   Paulene Floor, MD 09/24/2016, 10:16 AM

## 2016-09-27 ENCOUNTER — Encounter (HOSPITAL_BASED_OUTPATIENT_CLINIC_OR_DEPARTMENT_OTHER): Payer: Self-pay | Admitting: Orthopedic Surgery

## 2016-09-27 DIAGNOSIS — J3089 Other allergic rhinitis: Secondary | ICD-10-CM | POA: Diagnosis not present

## 2016-09-27 DIAGNOSIS — J3081 Allergic rhinitis due to animal (cat) (dog) hair and dander: Secondary | ICD-10-CM | POA: Diagnosis not present

## 2016-09-27 DIAGNOSIS — J301 Allergic rhinitis due to pollen: Secondary | ICD-10-CM | POA: Diagnosis not present

## 2016-09-27 NOTE — Op Note (Signed)
[name], Colleen Schmitt              ACCOUNT NO.:  [id number]  MEDICAL RECORD NO.:  [id number]  LOCATION:                                 FACILITY:  PHYSICIAN:  Satira Anis. Amedeo Plenty, M.D.     DATE OF BIRTH:  DATE OF PROCEDURE:  09/24/2016 DATE OF DISCHARGE:                              OPERATIVE REPORT   PREOPERATIVE DIAGNOSIS:  Left carpal tunnel syndrome.  POSTOPERATIVE DIAGNOSIS:  Left carpal tunnel syndrome.  PROCEDURES: 1. Left median nerve/peripheral nerve block at the wrist-forearm level     for anesthetic purposes for carpal tunnel release. 2. Left limited open carpal tunnel release.  SURGEON:  Satira Anis. Amedeo Plenty, M.D.  ASSISTANT:  None.  COMPLICATIONS:  None.  ANESTHESIA:  Peripheral nerve block with light IV sedation keeping the patient awake, alert, and oriented in the entire case.  TOURNIQUET TIME:  Less than 15 minutes.  INDICATION FOR THE PROCEDURE:  A 46 year old female presents with the above-mentioned diagnosis.  I have counseled her regarding risks and benefits of surgery and she desires to proceed with the above-mentioned operative intervention.  OPERATION IN DETAIL:  The patient was seen by myself and Anesthesia. Taken to the operative theater and underwent smooth induction of peripheral nerve/median nerve block with lidocaine and Sensorcaine without epinephrine.  The patient tolerated this well.  There were no complicating features.  I scrubbed her myself with a Hibiclens pre-scrub followed by the injection being performed without difficulty followed by 10-minute surgical Betadine scrub and paint.  Following this, time-out was observed.  Tourniquet insufflated.  A 1-cm incision was made in the distal edge of the transverse carpal ligament. Dissection was carried down.  Palmar fascia incised.  Distal edge of the transverse carpal ligament was identified and released under 4.0 loupe magnification.  Fat pad egressed nicely.  Distal to proximal  dissection was carried out under direct vision until adequate room was available for canal, prepared toward device 1, 2, and 3 and placed just under the proximal leading leaflet of the transverse carpal ligament.  The patient then had the security clip placed.  The security knife was placed and security clip effectively releasing the proximal leaflet and portions of the antebrachial fascia.  The patient tolerated this well.  She was awake, alert, and oriented in the entire case and had no discomfort or problems.  She tolerated the procedure well.  We irrigated copiously, deflated the tourniquet, secured hemostasis, and closed wound with Prolene.  She tolerated this well.  We will see her back in the office in a week.  Notify me should any problems occur.  Do's and don'ts have been discussed and all questions are addressed.     Satira Anis. Amedeo Plenty, M.D.   ______________________________ Satira Anis. Amedeo Plenty, M.D.    [facility]  D:  09/24/2016  T:  09/24/2016  Job:  [id number]

## 2016-09-28 DIAGNOSIS — J3081 Allergic rhinitis due to animal (cat) (dog) hair and dander: Secondary | ICD-10-CM | POA: Diagnosis not present

## 2016-09-28 DIAGNOSIS — J3089 Other allergic rhinitis: Secondary | ICD-10-CM | POA: Diagnosis not present

## 2016-09-30 DIAGNOSIS — H524 Presbyopia: Secondary | ICD-10-CM | POA: Diagnosis not present

## 2016-09-30 DIAGNOSIS — H5213 Myopia, bilateral: Secondary | ICD-10-CM | POA: Diagnosis not present

## 2016-09-30 DIAGNOSIS — H52223 Regular astigmatism, bilateral: Secondary | ICD-10-CM | POA: Diagnosis not present

## 2016-10-08 DIAGNOSIS — J3089 Other allergic rhinitis: Secondary | ICD-10-CM | POA: Diagnosis not present

## 2016-10-08 DIAGNOSIS — J3081 Allergic rhinitis due to animal (cat) (dog) hair and dander: Secondary | ICD-10-CM | POA: Diagnosis not present

## 2016-10-08 DIAGNOSIS — G5602 Carpal tunnel syndrome, left upper limb: Secondary | ICD-10-CM | POA: Diagnosis not present

## 2016-10-08 DIAGNOSIS — J301 Allergic rhinitis due to pollen: Secondary | ICD-10-CM | POA: Diagnosis not present

## 2016-10-12 DIAGNOSIS — Z008 Encounter for other general examination: Secondary | ICD-10-CM | POA: Diagnosis not present

## 2016-10-12 DIAGNOSIS — F329 Major depressive disorder, single episode, unspecified: Secondary | ICD-10-CM | POA: Diagnosis not present

## 2016-10-12 DIAGNOSIS — R41840 Attention and concentration deficit: Secondary | ICD-10-CM | POA: Diagnosis not present

## 2016-10-12 DIAGNOSIS — Z79899 Other long term (current) drug therapy: Secondary | ICD-10-CM | POA: Diagnosis not present

## 2016-10-12 DIAGNOSIS — F900 Attention-deficit hyperactivity disorder, predominantly inattentive type: Secondary | ICD-10-CM | POA: Diagnosis not present

## 2016-10-12 MED FILL — VYVANSE 30 MG CAPSULE: 30 | 30 days supply | Qty: 30 | Fill #0

## 2016-10-13 DIAGNOSIS — Z01419 Encounter for gynecological examination (general) (routine) without abnormal findings: Secondary | ICD-10-CM | POA: Diagnosis not present

## 2016-10-13 DIAGNOSIS — Z6829 Body mass index (BMI) 29.0-29.9, adult: Secondary | ICD-10-CM | POA: Diagnosis not present

## 2016-10-13 DIAGNOSIS — Z1231 Encounter for screening mammogram for malignant neoplasm of breast: Secondary | ICD-10-CM | POA: Diagnosis not present

## 2016-10-15 DIAGNOSIS — J3081 Allergic rhinitis due to animal (cat) (dog) hair and dander: Secondary | ICD-10-CM | POA: Diagnosis not present

## 2016-10-15 DIAGNOSIS — J3089 Other allergic rhinitis: Secondary | ICD-10-CM | POA: Diagnosis not present

## 2016-10-15 DIAGNOSIS — J301 Allergic rhinitis due to pollen: Secondary | ICD-10-CM | POA: Diagnosis not present

## 2016-10-20 DIAGNOSIS — J3089 Other allergic rhinitis: Secondary | ICD-10-CM | POA: Diagnosis not present

## 2016-10-20 DIAGNOSIS — J301 Allergic rhinitis due to pollen: Secondary | ICD-10-CM | POA: Diagnosis not present

## 2016-10-20 DIAGNOSIS — J3081 Allergic rhinitis due to animal (cat) (dog) hair and dander: Secondary | ICD-10-CM | POA: Diagnosis not present

## 2016-10-22 DIAGNOSIS — J3089 Other allergic rhinitis: Secondary | ICD-10-CM | POA: Diagnosis not present

## 2016-10-22 DIAGNOSIS — G5602 Carpal tunnel syndrome, left upper limb: Secondary | ICD-10-CM | POA: Diagnosis not present

## 2016-10-26 DIAGNOSIS — J301 Allergic rhinitis due to pollen: Secondary | ICD-10-CM | POA: Diagnosis not present

## 2016-10-26 DIAGNOSIS — J3089 Other allergic rhinitis: Secondary | ICD-10-CM | POA: Diagnosis not present

## 2016-10-26 DIAGNOSIS — J3081 Allergic rhinitis due to animal (cat) (dog) hair and dander: Secondary | ICD-10-CM | POA: Diagnosis not present

## 2016-10-26 MED FILL — DESLORATADINE 5 MG TABLET: 5 | 90 days supply | Qty: 90 | Fill #1

## 2016-10-27 MED FILL — VYVANSE 50 MG CAPSULE: 50 | 30 days supply | Qty: 30 | Fill #0

## 2016-10-29 DIAGNOSIS — J3089 Other allergic rhinitis: Secondary | ICD-10-CM | POA: Diagnosis not present

## 2016-10-29 DIAGNOSIS — J3081 Allergic rhinitis due to animal (cat) (dog) hair and dander: Secondary | ICD-10-CM | POA: Diagnosis not present

## 2016-10-29 DIAGNOSIS — Z9884 Bariatric surgery status: Secondary | ICD-10-CM | POA: Diagnosis not present

## 2016-10-29 DIAGNOSIS — Z4651 Encounter for fitting and adjustment of gastric lap band: Secondary | ICD-10-CM | POA: Diagnosis not present

## 2016-11-01 DIAGNOSIS — F329 Major depressive disorder, single episode, unspecified: Secondary | ICD-10-CM | POA: Diagnosis not present

## 2016-11-01 DIAGNOSIS — Z008 Encounter for other general examination: Secondary | ICD-10-CM | POA: Diagnosis not present

## 2016-11-02 DIAGNOSIS — R5383 Other fatigue: Secondary | ICD-10-CM | POA: Diagnosis not present

## 2016-11-02 DIAGNOSIS — D649 Anemia, unspecified: Secondary | ICD-10-CM | POA: Diagnosis not present

## 2016-11-04 MED FILL — AZITHROMYCIN 250 MG TABLET: 250 | 5 days supply | Qty: 6 | Fill #0

## 2016-11-08 DIAGNOSIS — Z008 Encounter for other general examination: Secondary | ICD-10-CM | POA: Diagnosis not present

## 2016-11-08 DIAGNOSIS — F329 Major depressive disorder, single episode, unspecified: Secondary | ICD-10-CM | POA: Diagnosis not present

## 2016-11-10 DIAGNOSIS — R5383 Other fatigue: Secondary | ICD-10-CM | POA: Diagnosis not present

## 2016-11-10 MED FILL — VIIBRYD 40 MG TABLET: 40 | 90 days supply | Qty: 90 | Fill #0

## 2016-11-11 DIAGNOSIS — J301 Allergic rhinitis due to pollen: Secondary | ICD-10-CM | POA: Diagnosis not present

## 2016-11-11 DIAGNOSIS — J3081 Allergic rhinitis due to animal (cat) (dog) hair and dander: Secondary | ICD-10-CM | POA: Diagnosis not present

## 2016-11-11 DIAGNOSIS — J3089 Other allergic rhinitis: Secondary | ICD-10-CM | POA: Diagnosis not present

## 2016-11-15 DIAGNOSIS — J301 Allergic rhinitis due to pollen: Secondary | ICD-10-CM | POA: Diagnosis not present

## 2016-11-15 DIAGNOSIS — F329 Major depressive disorder, single episode, unspecified: Secondary | ICD-10-CM | POA: Diagnosis not present

## 2016-11-15 DIAGNOSIS — Z008 Encounter for other general examination: Secondary | ICD-10-CM | POA: Diagnosis not present

## 2016-11-15 DIAGNOSIS — J3089 Other allergic rhinitis: Secondary | ICD-10-CM | POA: Diagnosis not present

## 2016-11-15 DIAGNOSIS — J3081 Allergic rhinitis due to animal (cat) (dog) hair and dander: Secondary | ICD-10-CM | POA: Diagnosis not present

## 2016-11-17 DIAGNOSIS — Z79899 Other long term (current) drug therapy: Secondary | ICD-10-CM | POA: Diagnosis not present

## 2016-11-17 DIAGNOSIS — F900 Attention-deficit hyperactivity disorder, predominantly inattentive type: Secondary | ICD-10-CM | POA: Diagnosis not present

## 2016-11-17 MED FILL — ADDERALL XR 30 MG CAP SA: 30 | 30 days supply | Qty: 30 | Fill #0

## 2016-11-26 DIAGNOSIS — J3081 Allergic rhinitis due to animal (cat) (dog) hair and dander: Secondary | ICD-10-CM | POA: Diagnosis not present

## 2016-11-26 DIAGNOSIS — J301 Allergic rhinitis due to pollen: Secondary | ICD-10-CM | POA: Diagnosis not present

## 2016-11-26 DIAGNOSIS — J3089 Other allergic rhinitis: Secondary | ICD-10-CM | POA: Diagnosis not present

## 2016-11-29 DIAGNOSIS — J3089 Other allergic rhinitis: Secondary | ICD-10-CM | POA: Diagnosis not present

## 2016-11-29 DIAGNOSIS — J301 Allergic rhinitis due to pollen: Secondary | ICD-10-CM | POA: Diagnosis not present

## 2016-11-29 DIAGNOSIS — J3081 Allergic rhinitis due to animal (cat) (dog) hair and dander: Secondary | ICD-10-CM | POA: Diagnosis not present

## 2016-12-06 DIAGNOSIS — F329 Major depressive disorder, single episode, unspecified: Secondary | ICD-10-CM | POA: Diagnosis not present

## 2016-12-06 DIAGNOSIS — Z008 Encounter for other general examination: Secondary | ICD-10-CM | POA: Diagnosis not present

## 2016-12-08 DIAGNOSIS — J3081 Allergic rhinitis due to animal (cat) (dog) hair and dander: Secondary | ICD-10-CM | POA: Diagnosis not present

## 2016-12-08 DIAGNOSIS — J3089 Other allergic rhinitis: Secondary | ICD-10-CM | POA: Diagnosis not present

## 2016-12-08 DIAGNOSIS — J301 Allergic rhinitis due to pollen: Secondary | ICD-10-CM | POA: Diagnosis not present

## 2016-12-16 DIAGNOSIS — Z008 Encounter for other general examination: Secondary | ICD-10-CM | POA: Diagnosis not present

## 2016-12-16 DIAGNOSIS — F329 Major depressive disorder, single episode, unspecified: Secondary | ICD-10-CM | POA: Diagnosis not present

## 2016-12-17 DIAGNOSIS — J3089 Other allergic rhinitis: Secondary | ICD-10-CM | POA: Diagnosis not present

## 2016-12-17 DIAGNOSIS — J301 Allergic rhinitis due to pollen: Secondary | ICD-10-CM | POA: Diagnosis not present

## 2016-12-17 DIAGNOSIS — J3081 Allergic rhinitis due to animal (cat) (dog) hair and dander: Secondary | ICD-10-CM | POA: Diagnosis not present

## 2016-12-17 DIAGNOSIS — J4520 Mild intermittent asthma, uncomplicated: Secondary | ICD-10-CM | POA: Diagnosis not present

## 2016-12-23 DIAGNOSIS — Z008 Encounter for other general examination: Secondary | ICD-10-CM | POA: Diagnosis not present

## 2016-12-23 DIAGNOSIS — F329 Major depressive disorder, single episode, unspecified: Secondary | ICD-10-CM | POA: Diagnosis not present

## 2016-12-27 DIAGNOSIS — L570 Actinic keratosis: Secondary | ICD-10-CM | POA: Diagnosis not present

## 2016-12-27 DIAGNOSIS — Z008 Encounter for other general examination: Secondary | ICD-10-CM | POA: Diagnosis not present

## 2016-12-27 DIAGNOSIS — J301 Allergic rhinitis due to pollen: Secondary | ICD-10-CM | POA: Diagnosis not present

## 2016-12-27 DIAGNOSIS — L813 Cafe au lait spots: Secondary | ICD-10-CM | POA: Diagnosis not present

## 2016-12-27 DIAGNOSIS — L738 Other specified follicular disorders: Secondary | ICD-10-CM | POA: Diagnosis not present

## 2016-12-27 DIAGNOSIS — Z85828 Personal history of other malignant neoplasm of skin: Secondary | ICD-10-CM | POA: Diagnosis not present

## 2016-12-27 DIAGNOSIS — F329 Major depressive disorder, single episode, unspecified: Secondary | ICD-10-CM | POA: Diagnosis not present

## 2016-12-27 DIAGNOSIS — L814 Other melanin hyperpigmentation: Secondary | ICD-10-CM | POA: Diagnosis not present

## 2016-12-27 DIAGNOSIS — D2271 Melanocytic nevi of right lower limb, including hip: Secondary | ICD-10-CM | POA: Diagnosis not present

## 2016-12-29 DIAGNOSIS — J343 Hypertrophy of nasal turbinates: Secondary | ICD-10-CM | POA: Diagnosis not present

## 2016-12-29 DIAGNOSIS — J310 Chronic rhinitis: Secondary | ICD-10-CM | POA: Diagnosis not present

## 2016-12-29 MED FILL — MONTELUKAST SOD 10 MG TAB: 10 | 30 days supply | Qty: 30 | Fill #1

## 2016-12-31 DIAGNOSIS — J301 Allergic rhinitis due to pollen: Secondary | ICD-10-CM | POA: Diagnosis not present

## 2016-12-31 DIAGNOSIS — J3089 Other allergic rhinitis: Secondary | ICD-10-CM | POA: Diagnosis not present

## 2016-12-31 DIAGNOSIS — J3081 Allergic rhinitis due to animal (cat) (dog) hair and dander: Secondary | ICD-10-CM | POA: Diagnosis not present

## 2016-12-31 MED FILL — METHYLPHENIDATE ER 36 MG TA: 36 | 10 days supply | Qty: 10 | Fill #0

## 2017-01-06 DIAGNOSIS — Z008 Encounter for other general examination: Secondary | ICD-10-CM | POA: Diagnosis not present

## 2017-01-06 DIAGNOSIS — F329 Major depressive disorder, single episode, unspecified: Secondary | ICD-10-CM | POA: Diagnosis not present

## 2017-01-07 DIAGNOSIS — J3081 Allergic rhinitis due to animal (cat) (dog) hair and dander: Secondary | ICD-10-CM | POA: Diagnosis not present

## 2017-01-07 DIAGNOSIS — J301 Allergic rhinitis due to pollen: Secondary | ICD-10-CM | POA: Diagnosis not present

## 2017-01-07 DIAGNOSIS — J3089 Other allergic rhinitis: Secondary | ICD-10-CM | POA: Diagnosis not present

## 2017-01-07 MED FILL — CONCERTA 36 MG TABLET ER: 36 | 20 days supply | Qty: 20 | Fill #1

## 2017-01-12 DIAGNOSIS — J3089 Other allergic rhinitis: Secondary | ICD-10-CM | POA: Diagnosis not present

## 2017-01-12 DIAGNOSIS — J301 Allergic rhinitis due to pollen: Secondary | ICD-10-CM | POA: Diagnosis not present

## 2017-01-12 DIAGNOSIS — J3081 Allergic rhinitis due to animal (cat) (dog) hair and dander: Secondary | ICD-10-CM | POA: Diagnosis not present

## 2017-01-13 MED FILL — DOXYCYCLINE MONO 100 MG CAP: 100 | 5 days supply | Qty: 10 | Fill #0

## 2017-01-13 MED FILL — HYDROmorphone HCL 2 MG TABS: 2 | 5 days supply | Qty: 30 | Fill #0

## 2017-01-13 MED FILL — METHOCARBAMOL 500 MG TABS: 500 | 10 days supply | Qty: 40 | Fill #0

## 2017-01-14 DIAGNOSIS — F329 Major depressive disorder, single episode, unspecified: Secondary | ICD-10-CM | POA: Diagnosis not present

## 2017-01-14 DIAGNOSIS — J3089 Other allergic rhinitis: Secondary | ICD-10-CM | POA: Diagnosis not present

## 2017-01-14 DIAGNOSIS — J301 Allergic rhinitis due to pollen: Secondary | ICD-10-CM | POA: Diagnosis not present

## 2017-01-14 DIAGNOSIS — J3081 Allergic rhinitis due to animal (cat) (dog) hair and dander: Secondary | ICD-10-CM | POA: Diagnosis not present

## 2017-01-14 DIAGNOSIS — Z008 Encounter for other general examination: Secondary | ICD-10-CM | POA: Diagnosis not present

## 2017-01-17 DIAGNOSIS — F329 Major depressive disorder, single episode, unspecified: Secondary | ICD-10-CM | POA: Diagnosis not present

## 2017-01-17 DIAGNOSIS — Z008 Encounter for other general examination: Secondary | ICD-10-CM | POA: Diagnosis not present

## 2017-01-18 DIAGNOSIS — F900 Attention-deficit hyperactivity disorder, predominantly inattentive type: Secondary | ICD-10-CM | POA: Diagnosis not present

## 2017-01-18 DIAGNOSIS — Z79899 Other long term (current) drug therapy: Secondary | ICD-10-CM | POA: Diagnosis not present

## 2017-01-18 DIAGNOSIS — F419 Anxiety disorder, unspecified: Secondary | ICD-10-CM | POA: Diagnosis not present

## 2017-01-18 MED FILL — ENOXAPARIN 40 MG/0.4 ML SYR: 40 | 12 days supply | Qty: 5 | Fill #0

## 2017-01-18 MED FILL — DESLORATADINE 5 MG TABLET: 5 | 90 days supply | Qty: 90 | Fill #2

## 2017-01-19 DIAGNOSIS — N926 Irregular menstruation, unspecified: Secondary | ICD-10-CM | POA: Diagnosis not present

## 2017-01-19 MED FILL — LEVONO-E ESTRAD 0.10-0.02-0: 0.1-0.02 & | 91 days supply | Qty: 91 | Fill #0

## 2017-01-20 DIAGNOSIS — J301 Allergic rhinitis due to pollen: Secondary | ICD-10-CM | POA: Diagnosis not present

## 2017-01-20 DIAGNOSIS — J3081 Allergic rhinitis due to animal (cat) (dog) hair and dander: Secondary | ICD-10-CM | POA: Diagnosis not present

## 2017-01-20 DIAGNOSIS — J3089 Other allergic rhinitis: Secondary | ICD-10-CM | POA: Diagnosis not present

## 2017-01-20 MED FILL — VYVANSE 30 MG CAPSULE: 30 | 30 days supply | Qty: 30 | Fill #0

## 2017-01-24 DIAGNOSIS — Z008 Encounter for other general examination: Secondary | ICD-10-CM | POA: Diagnosis not present

## 2017-01-24 DIAGNOSIS — J3081 Allergic rhinitis due to animal (cat) (dog) hair and dander: Secondary | ICD-10-CM | POA: Diagnosis not present

## 2017-01-24 DIAGNOSIS — J3089 Other allergic rhinitis: Secondary | ICD-10-CM | POA: Diagnosis not present

## 2017-01-24 DIAGNOSIS — F329 Major depressive disorder, single episode, unspecified: Secondary | ICD-10-CM | POA: Diagnosis not present

## 2017-01-24 DIAGNOSIS — J301 Allergic rhinitis due to pollen: Secondary | ICD-10-CM | POA: Diagnosis not present

## 2017-02-03 MED FILL — MONTELUKAST SOD 10 MG TAB: 10 | 90 days supply | Qty: 90 | Fill #0

## 2017-02-07 DIAGNOSIS — F329 Major depressive disorder, single episode, unspecified: Secondary | ICD-10-CM | POA: Diagnosis not present

## 2017-02-14 DIAGNOSIS — F329 Major depressive disorder, single episode, unspecified: Secondary | ICD-10-CM | POA: Diagnosis not present

## 2017-02-16 DIAGNOSIS — J3081 Allergic rhinitis due to animal (cat) (dog) hair and dander: Secondary | ICD-10-CM | POA: Diagnosis not present

## 2017-02-16 DIAGNOSIS — J301 Allergic rhinitis due to pollen: Secondary | ICD-10-CM | POA: Diagnosis not present

## 2017-02-16 DIAGNOSIS — J3089 Other allergic rhinitis: Secondary | ICD-10-CM | POA: Diagnosis not present

## 2017-02-22 DIAGNOSIS — J3089 Other allergic rhinitis: Secondary | ICD-10-CM | POA: Diagnosis not present

## 2017-02-22 DIAGNOSIS — F329 Major depressive disorder, single episode, unspecified: Secondary | ICD-10-CM | POA: Diagnosis not present

## 2017-02-22 DIAGNOSIS — J3081 Allergic rhinitis due to animal (cat) (dog) hair and dander: Secondary | ICD-10-CM | POA: Diagnosis not present

## 2017-02-22 DIAGNOSIS — J301 Allergic rhinitis due to pollen: Secondary | ICD-10-CM | POA: Diagnosis not present

## 2017-02-24 DIAGNOSIS — F329 Major depressive disorder, single episode, unspecified: Secondary | ICD-10-CM | POA: Diagnosis not present

## 2017-02-24 MED FILL — VYVANSE 30 MG CAPSULE: 30 | 30 days supply | Qty: 30 | Fill #0

## 2017-02-24 MED FILL — FLUoxetine HCL 10 MG CAPS: 10 | 30 days supply | Qty: 30 | Fill #0

## 2017-02-28 DIAGNOSIS — F329 Major depressive disorder, single episode, unspecified: Secondary | ICD-10-CM | POA: Diagnosis not present

## 2017-03-03 DIAGNOSIS — D649 Anemia, unspecified: Secondary | ICD-10-CM | POA: Diagnosis not present

## 2017-03-03 DIAGNOSIS — F411 Generalized anxiety disorder: Secondary | ICD-10-CM | POA: Diagnosis not present

## 2017-03-03 DIAGNOSIS — F900 Attention-deficit hyperactivity disorder, predominantly inattentive type: Secondary | ICD-10-CM | POA: Diagnosis not present

## 2017-03-03 DIAGNOSIS — Z6827 Body mass index (BMI) 27.0-27.9, adult: Secondary | ICD-10-CM | POA: Diagnosis not present

## 2017-03-03 DIAGNOSIS — N951 Menopausal and female climacteric states: Secondary | ICD-10-CM | POA: Diagnosis not present

## 2017-03-03 MED FILL — buPROPion HCL ER (XL) 150 M: 150 | 90 days supply | Qty: 90 | Fill #0

## 2017-03-04 DIAGNOSIS — J301 Allergic rhinitis due to pollen: Secondary | ICD-10-CM | POA: Diagnosis not present

## 2017-03-04 DIAGNOSIS — J3081 Allergic rhinitis due to animal (cat) (dog) hair and dander: Secondary | ICD-10-CM | POA: Diagnosis not present

## 2017-03-04 DIAGNOSIS — J3089 Other allergic rhinitis: Secondary | ICD-10-CM | POA: Diagnosis not present

## 2017-03-07 DIAGNOSIS — J3089 Other allergic rhinitis: Secondary | ICD-10-CM | POA: Diagnosis not present

## 2017-03-07 DIAGNOSIS — J3081 Allergic rhinitis due to animal (cat) (dog) hair and dander: Secondary | ICD-10-CM | POA: Diagnosis not present

## 2017-03-07 DIAGNOSIS — F329 Major depressive disorder, single episode, unspecified: Secondary | ICD-10-CM | POA: Diagnosis not present

## 2017-03-07 DIAGNOSIS — J301 Allergic rhinitis due to pollen: Secondary | ICD-10-CM | POA: Diagnosis not present

## 2017-03-14 DIAGNOSIS — F329 Major depressive disorder, single episode, unspecified: Secondary | ICD-10-CM | POA: Diagnosis not present

## 2017-03-17 DIAGNOSIS — J3081 Allergic rhinitis due to animal (cat) (dog) hair and dander: Secondary | ICD-10-CM | POA: Diagnosis not present

## 2017-03-17 DIAGNOSIS — J3089 Other allergic rhinitis: Secondary | ICD-10-CM | POA: Diagnosis not present

## 2017-03-17 MED FILL — DEXILANT DR 60 MG CAPSULE: 60 | 30 days supply | Qty: 30 | Fill #0

## 2017-03-18 DIAGNOSIS — J3089 Other allergic rhinitis: Secondary | ICD-10-CM | POA: Diagnosis not present

## 2017-03-18 DIAGNOSIS — J301 Allergic rhinitis due to pollen: Secondary | ICD-10-CM | POA: Diagnosis not present

## 2017-03-18 DIAGNOSIS — J3081 Allergic rhinitis due to animal (cat) (dog) hair and dander: Secondary | ICD-10-CM | POA: Diagnosis not present

## 2017-03-22 DIAGNOSIS — F329 Major depressive disorder, single episode, unspecified: Secondary | ICD-10-CM | POA: Diagnosis not present

## 2017-03-24 DIAGNOSIS — J301 Allergic rhinitis due to pollen: Secondary | ICD-10-CM | POA: Diagnosis not present

## 2017-03-24 DIAGNOSIS — J3089 Other allergic rhinitis: Secondary | ICD-10-CM | POA: Diagnosis not present

## 2017-03-24 DIAGNOSIS — J3081 Allergic rhinitis due to animal (cat) (dog) hair and dander: Secondary | ICD-10-CM | POA: Diagnosis not present

## 2017-03-24 MED FILL — FLUoxetine HCL 10 MG CAPS: 10 | 30 days supply | Qty: 30 | Fill #1

## 2017-03-28 DIAGNOSIS — F329 Major depressive disorder, single episode, unspecified: Secondary | ICD-10-CM | POA: Diagnosis not present

## 2017-04-01 DIAGNOSIS — J301 Allergic rhinitis due to pollen: Secondary | ICD-10-CM | POA: Diagnosis not present

## 2017-04-01 DIAGNOSIS — J3089 Other allergic rhinitis: Secondary | ICD-10-CM | POA: Diagnosis not present

## 2017-04-01 DIAGNOSIS — J3081 Allergic rhinitis due to animal (cat) (dog) hair and dander: Secondary | ICD-10-CM | POA: Diagnosis not present

## 2017-04-04 DIAGNOSIS — F329 Major depressive disorder, single episode, unspecified: Secondary | ICD-10-CM | POA: Diagnosis not present

## 2017-04-04 DIAGNOSIS — J301 Allergic rhinitis due to pollen: Secondary | ICD-10-CM | POA: Diagnosis not present

## 2017-04-04 DIAGNOSIS — J3089 Other allergic rhinitis: Secondary | ICD-10-CM | POA: Diagnosis not present

## 2017-04-04 DIAGNOSIS — J3081 Allergic rhinitis due to animal (cat) (dog) hair and dander: Secondary | ICD-10-CM | POA: Diagnosis not present

## 2017-04-05 DIAGNOSIS — N951 Menopausal and female climacteric states: Secondary | ICD-10-CM | POA: Diagnosis not present

## 2017-04-05 DIAGNOSIS — T148XXA Other injury of unspecified body region, initial encounter: Secondary | ICD-10-CM | POA: Diagnosis not present

## 2017-04-05 DIAGNOSIS — W540XXA Bitten by dog, initial encounter: Secondary | ICD-10-CM | POA: Diagnosis not present

## 2017-04-05 DIAGNOSIS — K219 Gastro-esophageal reflux disease without esophagitis: Secondary | ICD-10-CM | POA: Diagnosis not present

## 2017-04-05 DIAGNOSIS — F331 Major depressive disorder, recurrent, moderate: Secondary | ICD-10-CM | POA: Diagnosis not present

## 2017-04-05 DIAGNOSIS — Z6826 Body mass index (BMI) 26.0-26.9, adult: Secondary | ICD-10-CM | POA: Diagnosis not present

## 2017-04-05 DIAGNOSIS — F411 Generalized anxiety disorder: Secondary | ICD-10-CM | POA: Diagnosis not present

## 2017-04-05 MED FILL — DOXYCYCLINE HYCLATE 100 MG: 100 | 10 days supply | Qty: 20 | Fill #0

## 2017-04-05 MED FILL — CLINDAMYCIN HCL 300 MG CAPS: 300 | 10 days supply | Qty: 30 | Fill #0

## 2017-04-11 DIAGNOSIS — J3081 Allergic rhinitis due to animal (cat) (dog) hair and dander: Secondary | ICD-10-CM | POA: Diagnosis not present

## 2017-04-11 DIAGNOSIS — J3089 Other allergic rhinitis: Secondary | ICD-10-CM | POA: Diagnosis not present

## 2017-04-11 DIAGNOSIS — J301 Allergic rhinitis due to pollen: Secondary | ICD-10-CM | POA: Diagnosis not present

## 2017-04-11 DIAGNOSIS — F329 Major depressive disorder, single episode, unspecified: Secondary | ICD-10-CM | POA: Diagnosis not present

## 2017-04-15 DIAGNOSIS — J301 Allergic rhinitis due to pollen: Secondary | ICD-10-CM | POA: Diagnosis not present

## 2017-04-18 DIAGNOSIS — J3081 Allergic rhinitis due to animal (cat) (dog) hair and dander: Secondary | ICD-10-CM | POA: Diagnosis not present

## 2017-04-18 DIAGNOSIS — J3089 Other allergic rhinitis: Secondary | ICD-10-CM | POA: Diagnosis not present

## 2017-04-18 DIAGNOSIS — J301 Allergic rhinitis due to pollen: Secondary | ICD-10-CM | POA: Diagnosis not present

## 2017-04-18 DIAGNOSIS — F329 Major depressive disorder, single episode, unspecified: Secondary | ICD-10-CM | POA: Diagnosis not present

## 2017-04-19 DIAGNOSIS — Z79899 Other long term (current) drug therapy: Secondary | ICD-10-CM | POA: Diagnosis not present

## 2017-04-19 DIAGNOSIS — D509 Iron deficiency anemia, unspecified: Secondary | ICD-10-CM | POA: Diagnosis not present

## 2017-04-19 DIAGNOSIS — F900 Attention-deficit hyperactivity disorder, predominantly inattentive type: Secondary | ICD-10-CM | POA: Diagnosis not present

## 2017-04-21 MED FILL — MONTELUKAST SOD 10 MG TAB: 10 | 90 days supply | Qty: 90 | Fill #1

## 2017-04-21 MED FILL — DEXILANT DR 60 MG CAPSULE: 60 | 30 days supply | Qty: 30 | Fill #0

## 2017-04-21 MED FILL — DESLORATADINE 5 MG TAB: 5 | 90 days supply | Qty: 90 | Fill #0

## 2017-04-21 MED FILL — FLUoxetine HCL 10 MG CAPS: 10 | 30 days supply | Qty: 30 | Fill #2

## 2017-04-25 DIAGNOSIS — F329 Major depressive disorder, single episode, unspecified: Secondary | ICD-10-CM | POA: Diagnosis not present

## 2017-04-26 MED FILL — VYVANSE 50 MG CAPSULE: 50 | 30 days supply | Qty: 30 | Fill #0

## 2017-04-28 DIAGNOSIS — J301 Allergic rhinitis due to pollen: Secondary | ICD-10-CM | POA: Diagnosis not present

## 2017-04-28 DIAGNOSIS — J3089 Other allergic rhinitis: Secondary | ICD-10-CM | POA: Diagnosis not present

## 2017-04-28 DIAGNOSIS — J3081 Allergic rhinitis due to animal (cat) (dog) hair and dander: Secondary | ICD-10-CM | POA: Diagnosis not present

## 2017-05-02 DIAGNOSIS — F329 Major depressive disorder, single episode, unspecified: Secondary | ICD-10-CM | POA: Diagnosis not present

## 2017-05-03 DIAGNOSIS — Z1211 Encounter for screening for malignant neoplasm of colon: Secondary | ICD-10-CM | POA: Diagnosis not present

## 2017-05-03 DIAGNOSIS — Z86010 Personal history of colonic polyps: Secondary | ICD-10-CM | POA: Diagnosis not present

## 2017-05-03 DIAGNOSIS — K219 Gastro-esophageal reflux disease without esophagitis: Secondary | ICD-10-CM | POA: Diagnosis not present

## 2017-05-03 DIAGNOSIS — K5730 Diverticulosis of large intestine without perforation or abscess without bleeding: Secondary | ICD-10-CM | POA: Diagnosis not present

## 2017-05-05 DIAGNOSIS — J3081 Allergic rhinitis due to animal (cat) (dog) hair and dander: Secondary | ICD-10-CM | POA: Diagnosis not present

## 2017-05-05 DIAGNOSIS — J301 Allergic rhinitis due to pollen: Secondary | ICD-10-CM | POA: Diagnosis not present

## 2017-05-05 DIAGNOSIS — J3089 Other allergic rhinitis: Secondary | ICD-10-CM | POA: Diagnosis not present

## 2017-05-09 DIAGNOSIS — F329 Major depressive disorder, single episode, unspecified: Secondary | ICD-10-CM | POA: Diagnosis not present

## 2017-05-12 DIAGNOSIS — H1132 Conjunctival hemorrhage, left eye: Secondary | ICD-10-CM | POA: Diagnosis not present

## 2017-05-16 DIAGNOSIS — F329 Major depressive disorder, single episode, unspecified: Secondary | ICD-10-CM | POA: Diagnosis not present

## 2017-05-16 DIAGNOSIS — J3089 Other allergic rhinitis: Secondary | ICD-10-CM | POA: Diagnosis not present

## 2017-05-16 DIAGNOSIS — J301 Allergic rhinitis due to pollen: Secondary | ICD-10-CM | POA: Diagnosis not present

## 2017-05-16 DIAGNOSIS — J3081 Allergic rhinitis due to animal (cat) (dog) hair and dander: Secondary | ICD-10-CM | POA: Diagnosis not present

## 2017-05-20 MED FILL — FLUoxetine HCL 10 MG CAPS: 10 | 90 days supply | Qty: 90 | Fill #0

## 2017-05-23 DIAGNOSIS — F329 Major depressive disorder, single episode, unspecified: Secondary | ICD-10-CM | POA: Diagnosis not present

## 2017-05-24 DIAGNOSIS — J3089 Other allergic rhinitis: Secondary | ICD-10-CM | POA: Diagnosis not present

## 2017-05-24 DIAGNOSIS — J301 Allergic rhinitis due to pollen: Secondary | ICD-10-CM | POA: Diagnosis not present

## 2017-05-24 DIAGNOSIS — J3081 Allergic rhinitis due to animal (cat) (dog) hair and dander: Secondary | ICD-10-CM | POA: Diagnosis not present

## 2017-05-30 DIAGNOSIS — F329 Major depressive disorder, single episode, unspecified: Secondary | ICD-10-CM | POA: Diagnosis not present

## 2017-05-30 MED FILL — VYVANSE 50 MG CAPSULE: 50 | 30 days supply | Qty: 30 | Fill #0

## 2017-06-02 MED FILL — BUPROPION HCL XL 150 MG TAB: 150 | 90 days supply | Qty: 90 | Fill #0

## 2017-06-03 DIAGNOSIS — J301 Allergic rhinitis due to pollen: Secondary | ICD-10-CM | POA: Diagnosis not present

## 2017-06-03 DIAGNOSIS — J3081 Allergic rhinitis due to animal (cat) (dog) hair and dander: Secondary | ICD-10-CM | POA: Diagnosis not present

## 2017-06-03 DIAGNOSIS — J3089 Other allergic rhinitis: Secondary | ICD-10-CM | POA: Diagnosis not present

## 2017-06-10 DIAGNOSIS — J3081 Allergic rhinitis due to animal (cat) (dog) hair and dander: Secondary | ICD-10-CM | POA: Diagnosis not present

## 2017-06-10 DIAGNOSIS — J3089 Other allergic rhinitis: Secondary | ICD-10-CM | POA: Diagnosis not present

## 2017-06-10 DIAGNOSIS — J301 Allergic rhinitis due to pollen: Secondary | ICD-10-CM | POA: Diagnosis not present

## 2017-06-13 DIAGNOSIS — D125 Benign neoplasm of sigmoid colon: Secondary | ICD-10-CM | POA: Diagnosis not present

## 2017-06-13 DIAGNOSIS — K635 Polyp of colon: Secondary | ICD-10-CM | POA: Diagnosis not present

## 2017-06-13 DIAGNOSIS — D123 Benign neoplasm of transverse colon: Secondary | ICD-10-CM | POA: Diagnosis not present

## 2017-06-13 DIAGNOSIS — K5730 Diverticulosis of large intestine without perforation or abscess without bleeding: Secondary | ICD-10-CM | POA: Diagnosis not present

## 2017-06-13 DIAGNOSIS — Z1211 Encounter for screening for malignant neoplasm of colon: Secondary | ICD-10-CM | POA: Diagnosis not present

## 2017-06-16 DIAGNOSIS — J3089 Other allergic rhinitis: Secondary | ICD-10-CM | POA: Diagnosis not present

## 2017-06-16 DIAGNOSIS — J301 Allergic rhinitis due to pollen: Secondary | ICD-10-CM | POA: Diagnosis not present

## 2017-06-16 DIAGNOSIS — F329 Major depressive disorder, single episode, unspecified: Secondary | ICD-10-CM | POA: Diagnosis not present

## 2017-06-16 DIAGNOSIS — J3081 Allergic rhinitis due to animal (cat) (dog) hair and dander: Secondary | ICD-10-CM | POA: Diagnosis not present

## 2017-06-20 DIAGNOSIS — J301 Allergic rhinitis due to pollen: Secondary | ICD-10-CM | POA: Diagnosis not present

## 2017-06-20 DIAGNOSIS — F329 Major depressive disorder, single episode, unspecified: Secondary | ICD-10-CM | POA: Diagnosis not present

## 2017-06-22 DIAGNOSIS — J301 Allergic rhinitis due to pollen: Secondary | ICD-10-CM | POA: Diagnosis not present

## 2017-06-22 DIAGNOSIS — J3081 Allergic rhinitis due to animal (cat) (dog) hair and dander: Secondary | ICD-10-CM | POA: Diagnosis not present

## 2017-06-22 DIAGNOSIS — J3089 Other allergic rhinitis: Secondary | ICD-10-CM | POA: Diagnosis not present

## 2017-06-22 IMAGING — CT CT MAXILLOFACIAL W/O CM
3 series · 16 of 47 positions shown, 19 images · non-contrast
Comparison: None.

CLINICAL DATA: Chronic maxillary sinusitis. Congestion. Fusion
protocol.

EXAM:
CT MAXILLOFACIAL WITHOUT CONTRAST
TECHNIQUE: Multidetector CT imaging of the maxillofacial structures was
performed. Multiplanar CT image reconstructions were also generated.
A small metallic BB was placed on the right temple in order to
reliably differentiate right from left.

[Series 2: standard · axial · 0.42mm/px · z∈[+1230,+1387]mm · 10 of 183 slices shown, 13 images]
[im 13/183  brain]
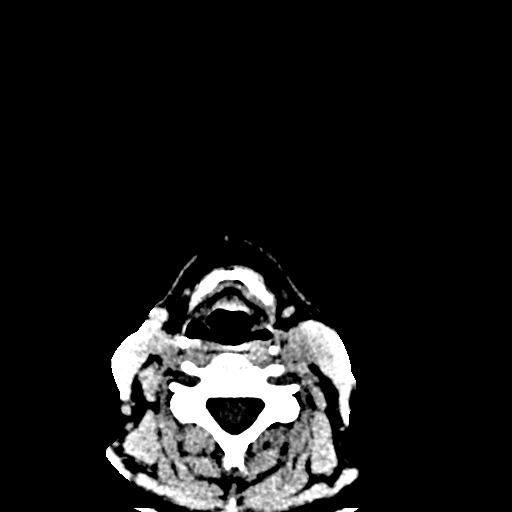
[im 13/183  bone]
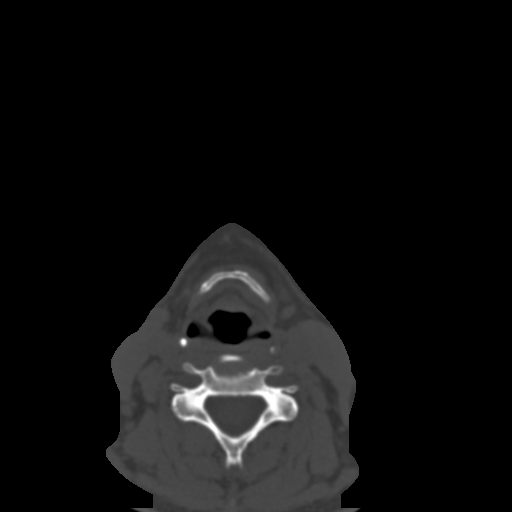
[im 32/183  bone]
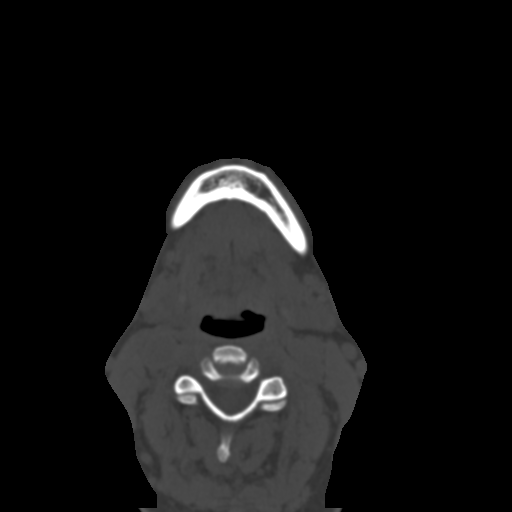
[im 51/183  bone]
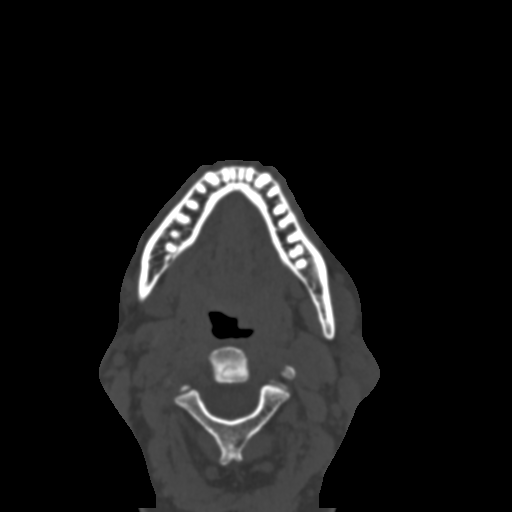
[im 63/183  bone]
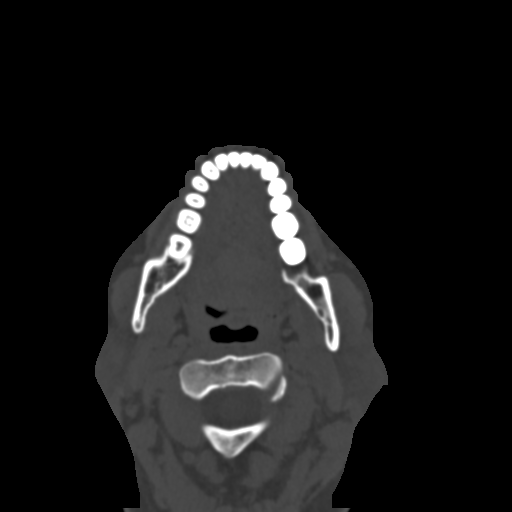
[im 82/183  brain]
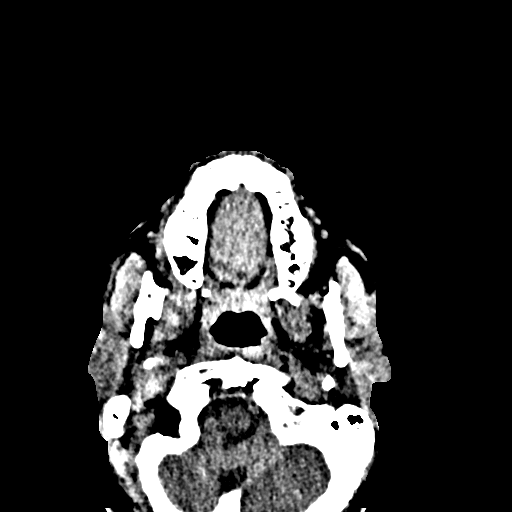
[im 82/183  bone]
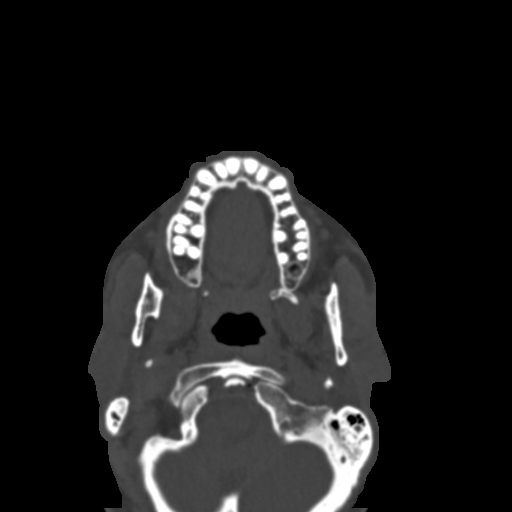
[im 101/183  bone]
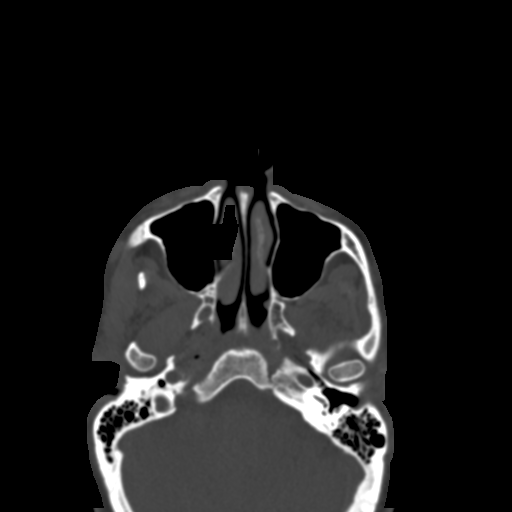
[im 120/183  bone]
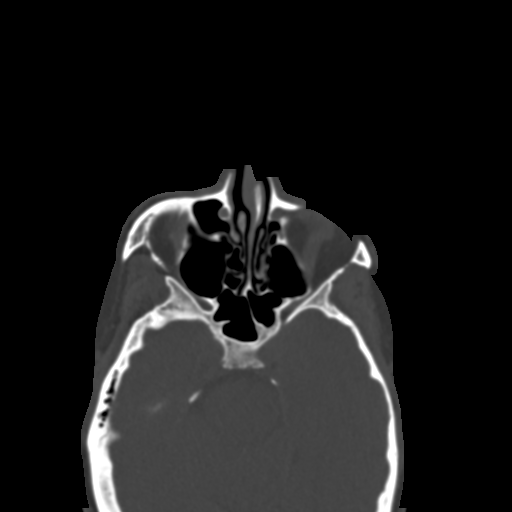
[im 139/183  bone]
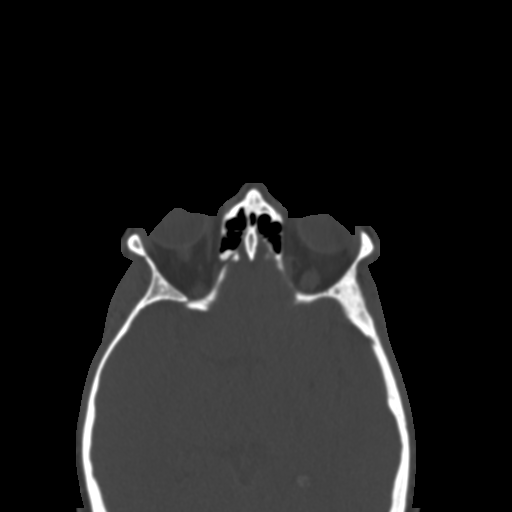
[im 151/183  brain]
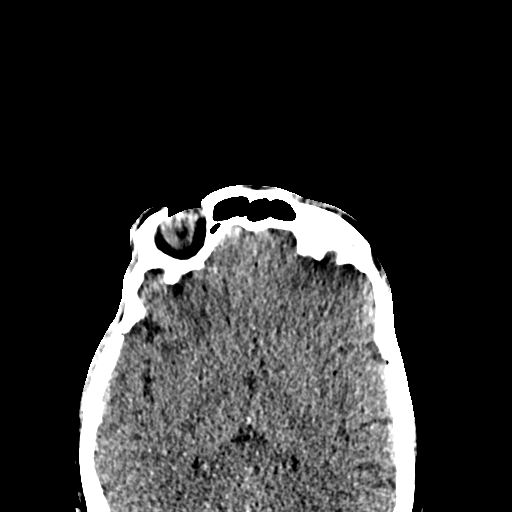
[im 151/183  bone]
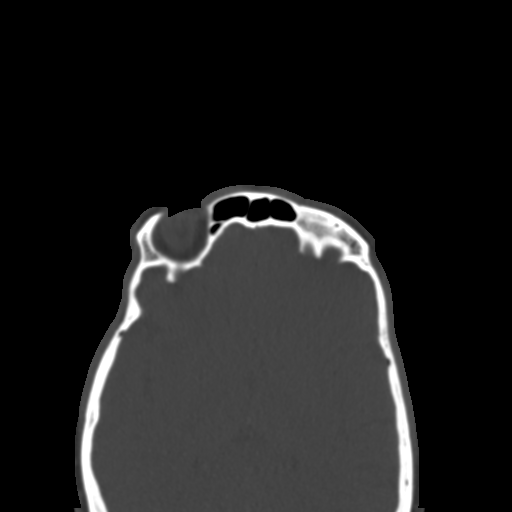
[im 170/183  bone]
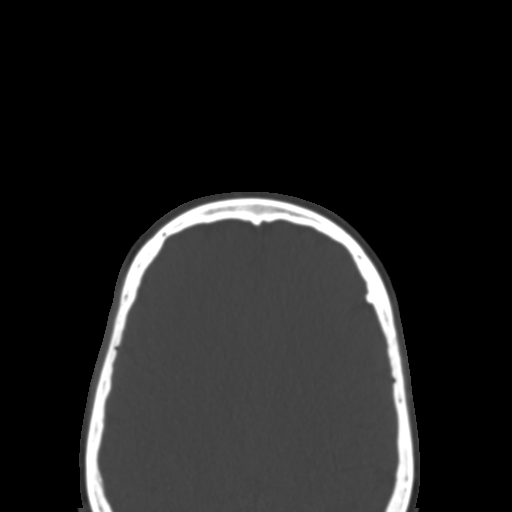

[Series 4: coronal · coronal · 0.36mm/px · 3 of 101 slices shown]
[im 34/101  bone]
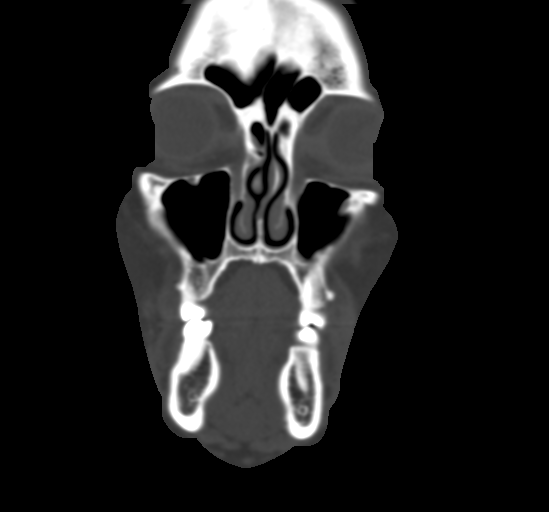
[im 45/101  bone]
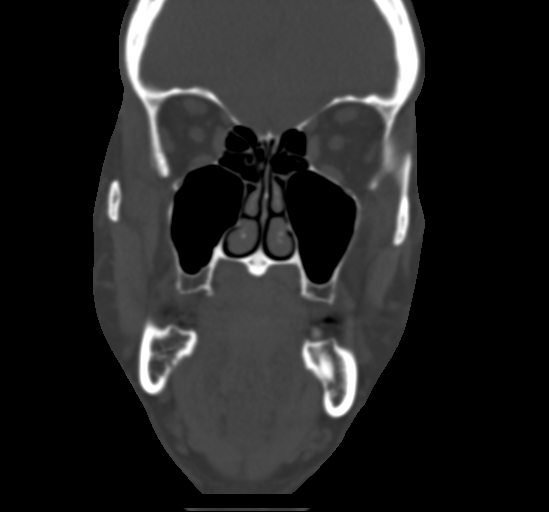
[im 56/101  bone]
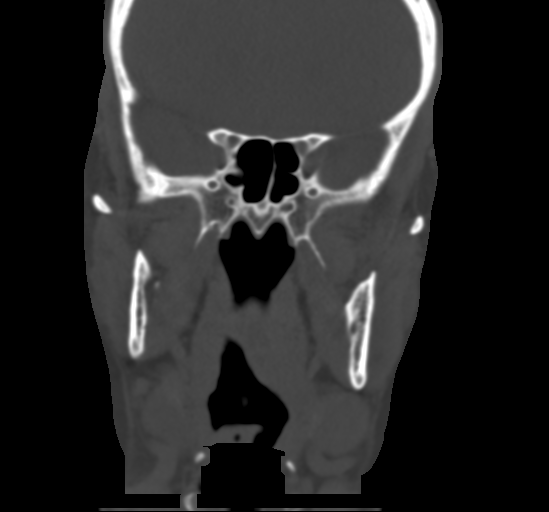

[Series 5: sagittal · sagittal · 0.36mm/px · 3 of 101 slices shown]
[im 34/101  bone]
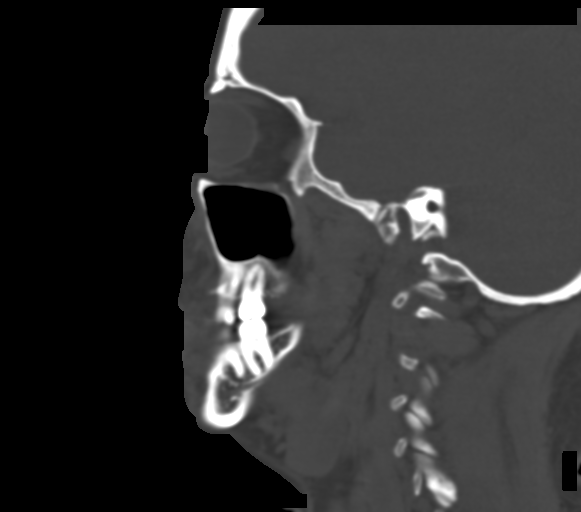
[im 51/101  bone]
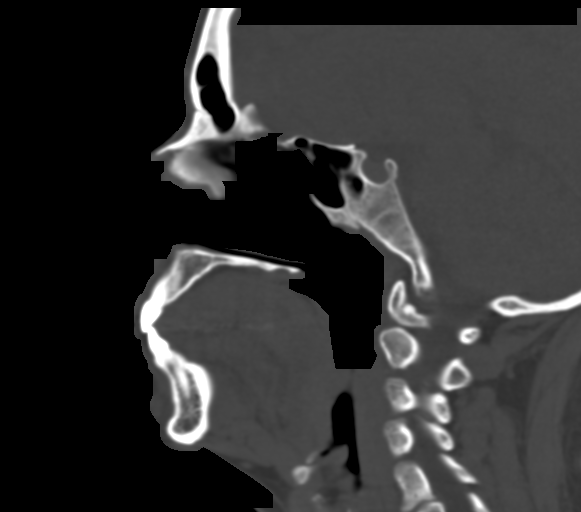
[im 67/101  bone]
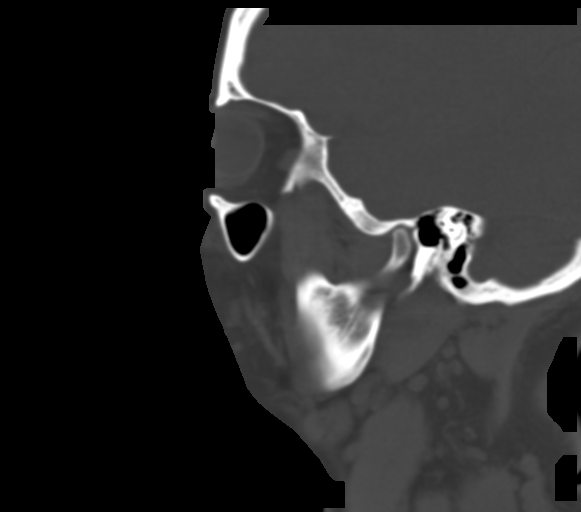

[16 of 47 positions shown; findings below may reference images not displayed]

FINDINGS: Osseous: No primary osseous lesion.

Orbits: Normal

Sinuses: Frontal sinuses are clear. Ethmoid sinuses show a single
opacified anterior ethmoid air cell on the right and a small right
ethmoid osteoma. Sphenoid sinus is clear and normal. Maxillary
sinuses are clear and normal except for very minimal mucosal
thickening along the floors.

There are Haller cells bilaterally with narrowing of the
infundibulae. Nasal septum bows 3 mm towards the left. Nasal
passages are narrow because of extensively developed maxillary
sinuses. Olfactory grooves are symmetric and normal. Lamina
papyracea are intact. Anterior ethmoidal notches are within normal
limits.

Soft tissues: Otherwise negative

Limited intracranial: Negative
IMPRESSION: Fusion protocol.

No evidence of significant active sinus inflammation. Single
opacified small anterior ethmoid air cell on the right. Small right
ethmoid osteoma.

Maxillary sinuses are broadly developed and the nasal passages are
somewhat narrow because of that, presumably.

Small Haller cells with narrow infundibulae bilaterally.

## 2017-06-27 DIAGNOSIS — F329 Major depressive disorder, single episode, unspecified: Secondary | ICD-10-CM | POA: Diagnosis not present

## 2017-06-29 DIAGNOSIS — K219 Gastro-esophageal reflux disease without esophagitis: Secondary | ICD-10-CM | POA: Diagnosis not present

## 2017-06-29 DIAGNOSIS — Z6827 Body mass index (BMI) 27.0-27.9, adult: Secondary | ICD-10-CM | POA: Diagnosis not present

## 2017-06-29 DIAGNOSIS — F411 Generalized anxiety disorder: Secondary | ICD-10-CM | POA: Diagnosis not present

## 2017-06-29 DIAGNOSIS — N951 Menopausal and female climacteric states: Secondary | ICD-10-CM | POA: Diagnosis not present

## 2017-06-29 MED FILL — raNITIdine HCL 300 MG TABS: 300 | 90 days supply | Qty: 90 | Fill #0

## 2017-06-30 MED FILL — VYVANSE 50 MG CAPSULE: 50 | 30 days supply | Qty: 30 | Fill #0

## 2017-06-30 MED FILL — DEXILANT DR 60 MG CAPSULE: 60 | 30 days supply | Qty: 30 | Fill #1

## 2017-07-07 DIAGNOSIS — J3081 Allergic rhinitis due to animal (cat) (dog) hair and dander: Secondary | ICD-10-CM | POA: Diagnosis not present

## 2017-07-07 DIAGNOSIS — J3089 Other allergic rhinitis: Secondary | ICD-10-CM | POA: Diagnosis not present

## 2017-07-07 DIAGNOSIS — F329 Major depressive disorder, single episode, unspecified: Secondary | ICD-10-CM | POA: Diagnosis not present

## 2017-07-07 DIAGNOSIS — J301 Allergic rhinitis due to pollen: Secondary | ICD-10-CM | POA: Diagnosis not present

## 2017-07-11 DIAGNOSIS — M6530 Trigger finger, unspecified finger: Secondary | ICD-10-CM | POA: Insufficient documentation

## 2017-07-11 DIAGNOSIS — M65312 Trigger thumb, left thumb: Secondary | ICD-10-CM | POA: Diagnosis not present

## 2017-07-11 DIAGNOSIS — G5602 Carpal tunnel syndrome, left upper limb: Secondary | ICD-10-CM | POA: Diagnosis not present

## 2017-07-11 DIAGNOSIS — M65311 Trigger thumb, right thumb: Secondary | ICD-10-CM | POA: Diagnosis not present

## 2017-07-11 DIAGNOSIS — M79642 Pain in left hand: Secondary | ICD-10-CM | POA: Diagnosis not present

## 2017-07-11 DIAGNOSIS — M79641 Pain in right hand: Secondary | ICD-10-CM | POA: Diagnosis not present

## 2017-07-12 DIAGNOSIS — J3089 Other allergic rhinitis: Secondary | ICD-10-CM | POA: Diagnosis not present

## 2017-07-12 DIAGNOSIS — J301 Allergic rhinitis due to pollen: Secondary | ICD-10-CM | POA: Diagnosis not present

## 2017-07-12 DIAGNOSIS — J3081 Allergic rhinitis due to animal (cat) (dog) hair and dander: Secondary | ICD-10-CM | POA: Diagnosis not present

## 2017-07-18 DIAGNOSIS — F329 Major depressive disorder, single episode, unspecified: Secondary | ICD-10-CM | POA: Diagnosis not present

## 2017-07-19 DIAGNOSIS — F902 Attention-deficit hyperactivity disorder, combined type: Secondary | ICD-10-CM | POA: Diagnosis not present

## 2017-07-19 DIAGNOSIS — Z79899 Other long term (current) drug therapy: Secondary | ICD-10-CM | POA: Diagnosis not present

## 2017-07-19 DIAGNOSIS — F900 Attention-deficit hyperactivity disorder, predominantly inattentive type: Secondary | ICD-10-CM | POA: Diagnosis not present

## 2017-07-20 DIAGNOSIS — J3081 Allergic rhinitis due to animal (cat) (dog) hair and dander: Secondary | ICD-10-CM | POA: Diagnosis not present

## 2017-07-20 DIAGNOSIS — J301 Allergic rhinitis due to pollen: Secondary | ICD-10-CM | POA: Diagnosis not present

## 2017-07-20 DIAGNOSIS — J3089 Other allergic rhinitis: Secondary | ICD-10-CM | POA: Diagnosis not present

## 2017-07-22 DIAGNOSIS — J301 Allergic rhinitis due to pollen: Secondary | ICD-10-CM | POA: Diagnosis not present

## 2017-07-25 DIAGNOSIS — E559 Vitamin D deficiency, unspecified: Secondary | ICD-10-CM | POA: Diagnosis not present

## 2017-07-25 DIAGNOSIS — R5383 Other fatigue: Secondary | ICD-10-CM | POA: Diagnosis not present

## 2017-07-25 DIAGNOSIS — D649 Anemia, unspecified: Secondary | ICD-10-CM | POA: Diagnosis not present

## 2017-07-25 DIAGNOSIS — K219 Gastro-esophageal reflux disease without esophagitis: Secondary | ICD-10-CM | POA: Diagnosis not present

## 2017-07-25 DIAGNOSIS — F329 Major depressive disorder, single episode, unspecified: Secondary | ICD-10-CM | POA: Diagnosis not present

## 2017-07-26 DIAGNOSIS — J3089 Other allergic rhinitis: Secondary | ICD-10-CM | POA: Diagnosis not present

## 2017-07-26 DIAGNOSIS — J3081 Allergic rhinitis due to animal (cat) (dog) hair and dander: Secondary | ICD-10-CM | POA: Diagnosis not present

## 2017-07-26 DIAGNOSIS — J301 Allergic rhinitis due to pollen: Secondary | ICD-10-CM | POA: Diagnosis not present

## 2017-07-27 DIAGNOSIS — Z6826 Body mass index (BMI) 26.0-26.9, adult: Secondary | ICD-10-CM | POA: Diagnosis not present

## 2017-07-27 DIAGNOSIS — N951 Menopausal and female climacteric states: Secondary | ICD-10-CM | POA: Diagnosis not present

## 2017-07-27 DIAGNOSIS — F411 Generalized anxiety disorder: Secondary | ICD-10-CM | POA: Diagnosis not present

## 2017-07-27 DIAGNOSIS — D509 Iron deficiency anemia, unspecified: Secondary | ICD-10-CM | POA: Diagnosis not present

## 2017-07-27 DIAGNOSIS — F900 Attention-deficit hyperactivity disorder, predominantly inattentive type: Secondary | ICD-10-CM | POA: Diagnosis not present

## 2017-07-27 DIAGNOSIS — E559 Vitamin D deficiency, unspecified: Secondary | ICD-10-CM | POA: Diagnosis not present

## 2017-07-29 DIAGNOSIS — J301 Allergic rhinitis due to pollen: Secondary | ICD-10-CM | POA: Diagnosis not present

## 2017-08-01 MED FILL — DESLORATADINE 5 MG TAB: 5 | 90 days supply | Qty: 90 | Fill #1

## 2017-08-01 MED FILL — MONTELUKAST SOD 10 MG TAB: 10 | 90 days supply | Qty: 90 | Fill #0

## 2017-08-02 DIAGNOSIS — J3089 Other allergic rhinitis: Secondary | ICD-10-CM | POA: Diagnosis not present

## 2017-08-02 DIAGNOSIS — J3081 Allergic rhinitis due to animal (cat) (dog) hair and dander: Secondary | ICD-10-CM | POA: Diagnosis not present

## 2017-08-04 DIAGNOSIS — F329 Major depressive disorder, single episode, unspecified: Secondary | ICD-10-CM | POA: Diagnosis not present

## 2017-08-05 DIAGNOSIS — J3089 Other allergic rhinitis: Secondary | ICD-10-CM | POA: Diagnosis not present

## 2017-08-05 DIAGNOSIS — J3081 Allergic rhinitis due to animal (cat) (dog) hair and dander: Secondary | ICD-10-CM | POA: Diagnosis not present

## 2017-08-05 DIAGNOSIS — J301 Allergic rhinitis due to pollen: Secondary | ICD-10-CM | POA: Diagnosis not present

## 2017-08-11 DIAGNOSIS — F329 Major depressive disorder, single episode, unspecified: Secondary | ICD-10-CM | POA: Diagnosis not present

## 2017-08-11 DIAGNOSIS — J3089 Other allergic rhinitis: Secondary | ICD-10-CM | POA: Diagnosis not present

## 2017-08-11 DIAGNOSIS — J301 Allergic rhinitis due to pollen: Secondary | ICD-10-CM | POA: Diagnosis not present

## 2017-08-11 DIAGNOSIS — J3081 Allergic rhinitis due to animal (cat) (dog) hair and dander: Secondary | ICD-10-CM | POA: Diagnosis not present

## 2017-08-18 DIAGNOSIS — J301 Allergic rhinitis due to pollen: Secondary | ICD-10-CM | POA: Diagnosis not present

## 2017-08-18 DIAGNOSIS — J3089 Other allergic rhinitis: Secondary | ICD-10-CM | POA: Diagnosis not present

## 2017-08-18 DIAGNOSIS — F329 Major depressive disorder, single episode, unspecified: Secondary | ICD-10-CM | POA: Diagnosis not present

## 2017-08-18 DIAGNOSIS — J3081 Allergic rhinitis due to animal (cat) (dog) hair and dander: Secondary | ICD-10-CM | POA: Diagnosis not present

## 2017-08-26 DIAGNOSIS — J3081 Allergic rhinitis due to animal (cat) (dog) hair and dander: Secondary | ICD-10-CM | POA: Diagnosis not present

## 2017-08-26 DIAGNOSIS — J301 Allergic rhinitis due to pollen: Secondary | ICD-10-CM | POA: Diagnosis not present

## 2017-08-26 DIAGNOSIS — J3089 Other allergic rhinitis: Secondary | ICD-10-CM | POA: Diagnosis not present

## 2017-08-26 MED FILL — buPROPion HCL ER (XL) 150 M: 150 | 90 days supply | Qty: 90 | Fill #1

## 2017-08-30 MED FILL — FLUoxetine HCL 10 MG CAPS: 10 | 90 days supply | Qty: 90 | Fill #0

## 2017-09-01 DIAGNOSIS — J3081 Allergic rhinitis due to animal (cat) (dog) hair and dander: Secondary | ICD-10-CM | POA: Diagnosis not present

## 2017-09-01 DIAGNOSIS — J301 Allergic rhinitis due to pollen: Secondary | ICD-10-CM | POA: Diagnosis not present

## 2017-09-01 DIAGNOSIS — F329 Major depressive disorder, single episode, unspecified: Secondary | ICD-10-CM | POA: Diagnosis not present

## 2017-09-01 DIAGNOSIS — J3089 Other allergic rhinitis: Secondary | ICD-10-CM | POA: Diagnosis not present

## 2017-09-02 DIAGNOSIS — Z0000 Encounter for general adult medical examination without abnormal findings: Secondary | ICD-10-CM | POA: Diagnosis not present

## 2017-09-07 DIAGNOSIS — Z6826 Body mass index (BMI) 26.0-26.9, adult: Secondary | ICD-10-CM | POA: Diagnosis not present

## 2017-09-07 DIAGNOSIS — Z0000 Encounter for general adult medical examination without abnormal findings: Secondary | ICD-10-CM | POA: Diagnosis not present

## 2017-09-07 DIAGNOSIS — Z23 Encounter for immunization: Secondary | ICD-10-CM | POA: Diagnosis not present

## 2017-09-08 DIAGNOSIS — F329 Major depressive disorder, single episode, unspecified: Secondary | ICD-10-CM | POA: Diagnosis not present

## 2017-09-09 DIAGNOSIS — J3081 Allergic rhinitis due to animal (cat) (dog) hair and dander: Secondary | ICD-10-CM | POA: Diagnosis not present

## 2017-09-09 DIAGNOSIS — J301 Allergic rhinitis due to pollen: Secondary | ICD-10-CM | POA: Diagnosis not present

## 2017-09-09 DIAGNOSIS — J3089 Other allergic rhinitis: Secondary | ICD-10-CM | POA: Diagnosis not present

## 2017-09-13 DIAGNOSIS — J301 Allergic rhinitis due to pollen: Secondary | ICD-10-CM | POA: Diagnosis not present

## 2017-09-13 DIAGNOSIS — J3081 Allergic rhinitis due to animal (cat) (dog) hair and dander: Secondary | ICD-10-CM | POA: Diagnosis not present

## 2017-09-13 DIAGNOSIS — J3089 Other allergic rhinitis: Secondary | ICD-10-CM | POA: Diagnosis not present

## 2017-09-15 DIAGNOSIS — J3089 Other allergic rhinitis: Secondary | ICD-10-CM | POA: Diagnosis not present

## 2017-09-15 DIAGNOSIS — F329 Major depressive disorder, single episode, unspecified: Secondary | ICD-10-CM | POA: Diagnosis not present

## 2017-09-15 DIAGNOSIS — J3081 Allergic rhinitis due to animal (cat) (dog) hair and dander: Secondary | ICD-10-CM | POA: Diagnosis not present

## 2017-09-22 DIAGNOSIS — F329 Major depressive disorder, single episode, unspecified: Secondary | ICD-10-CM | POA: Diagnosis not present

## 2017-09-23 DIAGNOSIS — J3089 Other allergic rhinitis: Secondary | ICD-10-CM | POA: Diagnosis not present

## 2017-09-23 DIAGNOSIS — J3081 Allergic rhinitis due to animal (cat) (dog) hair and dander: Secondary | ICD-10-CM | POA: Diagnosis not present

## 2017-09-23 DIAGNOSIS — J301 Allergic rhinitis due to pollen: Secondary | ICD-10-CM | POA: Diagnosis not present

## 2017-09-26 MED FILL — raNITIdine HCL 300 MG TABS: 300 | 90 days supply | Qty: 90 | Fill #1

## 2017-09-29 DIAGNOSIS — F329 Major depressive disorder, single episode, unspecified: Secondary | ICD-10-CM | POA: Diagnosis not present

## 2017-09-30 DIAGNOSIS — J3089 Other allergic rhinitis: Secondary | ICD-10-CM | POA: Diagnosis not present

## 2017-09-30 DIAGNOSIS — J3081 Allergic rhinitis due to animal (cat) (dog) hair and dander: Secondary | ICD-10-CM | POA: Diagnosis not present

## 2017-09-30 DIAGNOSIS — J301 Allergic rhinitis due to pollen: Secondary | ICD-10-CM | POA: Diagnosis not present

## 2017-10-07 DIAGNOSIS — J3089 Other allergic rhinitis: Secondary | ICD-10-CM | POA: Diagnosis not present

## 2017-10-07 DIAGNOSIS — J3081 Allergic rhinitis due to animal (cat) (dog) hair and dander: Secondary | ICD-10-CM | POA: Diagnosis not present

## 2017-10-07 DIAGNOSIS — J301 Allergic rhinitis due to pollen: Secondary | ICD-10-CM | POA: Diagnosis not present

## 2017-10-13 DIAGNOSIS — F329 Major depressive disorder, single episode, unspecified: Secondary | ICD-10-CM | POA: Diagnosis not present

## 2017-10-13 DIAGNOSIS — J3081 Allergic rhinitis due to animal (cat) (dog) hair and dander: Secondary | ICD-10-CM | POA: Diagnosis not present

## 2017-10-13 DIAGNOSIS — J301 Allergic rhinitis due to pollen: Secondary | ICD-10-CM | POA: Diagnosis not present

## 2017-10-13 DIAGNOSIS — J3089 Other allergic rhinitis: Secondary | ICD-10-CM | POA: Diagnosis not present

## 2017-10-17 DIAGNOSIS — F329 Major depressive disorder, single episode, unspecified: Secondary | ICD-10-CM | POA: Diagnosis not present

## 2017-10-21 DIAGNOSIS — J301 Allergic rhinitis due to pollen: Secondary | ICD-10-CM | POA: Diagnosis not present

## 2017-10-21 DIAGNOSIS — J3081 Allergic rhinitis due to animal (cat) (dog) hair and dander: Secondary | ICD-10-CM | POA: Diagnosis not present

## 2017-10-21 DIAGNOSIS — J3089 Other allergic rhinitis: Secondary | ICD-10-CM | POA: Diagnosis not present

## 2017-10-27 DIAGNOSIS — F329 Major depressive disorder, single episode, unspecified: Secondary | ICD-10-CM | POA: Diagnosis not present

## 2017-10-31 DIAGNOSIS — J301 Allergic rhinitis due to pollen: Secondary | ICD-10-CM | POA: Diagnosis not present

## 2017-10-31 DIAGNOSIS — F329 Major depressive disorder, single episode, unspecified: Secondary | ICD-10-CM | POA: Diagnosis not present

## 2017-10-31 DIAGNOSIS — J3089 Other allergic rhinitis: Secondary | ICD-10-CM | POA: Diagnosis not present

## 2017-10-31 DIAGNOSIS — J3081 Allergic rhinitis due to animal (cat) (dog) hair and dander: Secondary | ICD-10-CM | POA: Diagnosis not present

## 2017-10-31 MED FILL — MONTELUKAST SOD 10 MG TAB: 10 | 90 days supply | Qty: 90 | Fill #0

## 2017-10-31 MED FILL — DESLORATADINE 5 MG TAB: 5 | 90 days supply | Qty: 90 | Fill #0

## 2017-11-03 DIAGNOSIS — F329 Major depressive disorder, single episode, unspecified: Secondary | ICD-10-CM | POA: Diagnosis not present

## 2017-11-04 DIAGNOSIS — H5213 Myopia, bilateral: Secondary | ICD-10-CM | POA: Diagnosis not present

## 2017-11-04 DIAGNOSIS — H52222 Regular astigmatism, left eye: Secondary | ICD-10-CM | POA: Diagnosis not present

## 2017-11-04 DIAGNOSIS — H1045 Other chronic allergic conjunctivitis: Secondary | ICD-10-CM | POA: Diagnosis not present

## 2017-11-09 DIAGNOSIS — L570 Actinic keratosis: Secondary | ICD-10-CM | POA: Diagnosis not present

## 2017-11-09 DIAGNOSIS — Z85828 Personal history of other malignant neoplasm of skin: Secondary | ICD-10-CM | POA: Diagnosis not present

## 2017-11-09 DIAGNOSIS — L738 Other specified follicular disorders: Secondary | ICD-10-CM | POA: Diagnosis not present

## 2017-11-10 DIAGNOSIS — J301 Allergic rhinitis due to pollen: Secondary | ICD-10-CM | POA: Diagnosis not present

## 2017-11-10 DIAGNOSIS — J3081 Allergic rhinitis due to animal (cat) (dog) hair and dander: Secondary | ICD-10-CM | POA: Diagnosis not present

## 2017-11-10 DIAGNOSIS — J3089 Other allergic rhinitis: Secondary | ICD-10-CM | POA: Diagnosis not present

## 2017-11-17 DIAGNOSIS — F329 Major depressive disorder, single episode, unspecified: Secondary | ICD-10-CM | POA: Diagnosis not present

## 2017-11-17 DIAGNOSIS — J301 Allergic rhinitis due to pollen: Secondary | ICD-10-CM | POA: Diagnosis not present

## 2017-11-17 DIAGNOSIS — J3089 Other allergic rhinitis: Secondary | ICD-10-CM | POA: Diagnosis not present

## 2017-11-17 DIAGNOSIS — J3081 Allergic rhinitis due to animal (cat) (dog) hair and dander: Secondary | ICD-10-CM | POA: Diagnosis not present

## 2017-11-21 DIAGNOSIS — J3089 Other allergic rhinitis: Secondary | ICD-10-CM | POA: Diagnosis not present

## 2017-11-21 DIAGNOSIS — J3081 Allergic rhinitis due to animal (cat) (dog) hair and dander: Secondary | ICD-10-CM | POA: Diagnosis not present

## 2017-11-21 DIAGNOSIS — J301 Allergic rhinitis due to pollen: Secondary | ICD-10-CM | POA: Diagnosis not present

## 2017-11-21 DIAGNOSIS — F329 Major depressive disorder, single episode, unspecified: Secondary | ICD-10-CM | POA: Diagnosis not present

## 2017-11-28 MED FILL — FLUoxetine HCL 10 MG CAPS: 10 | 90 days supply | Qty: 90 | Fill #1

## 2017-11-30 DIAGNOSIS — F329 Major depressive disorder, single episode, unspecified: Secondary | ICD-10-CM | POA: Diagnosis not present

## 2017-12-01 DIAGNOSIS — J3081 Allergic rhinitis due to animal (cat) (dog) hair and dander: Secondary | ICD-10-CM | POA: Diagnosis not present

## 2017-12-01 DIAGNOSIS — J301 Allergic rhinitis due to pollen: Secondary | ICD-10-CM | POA: Diagnosis not present

## 2017-12-01 DIAGNOSIS — J3089 Other allergic rhinitis: Secondary | ICD-10-CM | POA: Diagnosis not present

## 2017-12-02 MED FILL — buPROPion HCL ER (XL) 150 M: 150 | 90 days supply | Qty: 90 | Fill #2

## 2017-12-05 DIAGNOSIS — F329 Major depressive disorder, single episode, unspecified: Secondary | ICD-10-CM | POA: Diagnosis not present

## 2017-12-05 DIAGNOSIS — J301 Allergic rhinitis due to pollen: Secondary | ICD-10-CM | POA: Diagnosis not present

## 2017-12-05 DIAGNOSIS — J3089 Other allergic rhinitis: Secondary | ICD-10-CM | POA: Diagnosis not present

## 2017-12-05 DIAGNOSIS — J3081 Allergic rhinitis due to animal (cat) (dog) hair and dander: Secondary | ICD-10-CM | POA: Diagnosis not present

## 2017-12-12 MED FILL — DEXILANT DR 60 MG CAPSULE: 60 | 30 days supply | Qty: 30 | Fill #2

## 2017-12-15 DIAGNOSIS — F329 Major depressive disorder, single episode, unspecified: Secondary | ICD-10-CM | POA: Diagnosis not present

## 2017-12-15 DIAGNOSIS — J3089 Other allergic rhinitis: Secondary | ICD-10-CM | POA: Diagnosis not present

## 2017-12-15 DIAGNOSIS — J3081 Allergic rhinitis due to animal (cat) (dog) hair and dander: Secondary | ICD-10-CM | POA: Diagnosis not present

## 2017-12-19 DIAGNOSIS — J3081 Allergic rhinitis due to animal (cat) (dog) hair and dander: Secondary | ICD-10-CM | POA: Diagnosis not present

## 2017-12-19 DIAGNOSIS — J3089 Other allergic rhinitis: Secondary | ICD-10-CM | POA: Diagnosis not present

## 2017-12-19 DIAGNOSIS — J301 Allergic rhinitis due to pollen: Secondary | ICD-10-CM | POA: Diagnosis not present

## 2018-01-03 DIAGNOSIS — J3081 Allergic rhinitis due to animal (cat) (dog) hair and dander: Secondary | ICD-10-CM | POA: Diagnosis not present

## 2018-01-03 DIAGNOSIS — J301 Allergic rhinitis due to pollen: Secondary | ICD-10-CM | POA: Diagnosis not present

## 2018-01-03 DIAGNOSIS — J3089 Other allergic rhinitis: Secondary | ICD-10-CM | POA: Diagnosis not present

## 2018-01-03 DIAGNOSIS — J4520 Mild intermittent asthma, uncomplicated: Secondary | ICD-10-CM | POA: Diagnosis not present

## 2018-01-16 MED FILL — DEXILANT DR 60 MG CAPSULE: 60 | 30 days supply | Qty: 30 | Fill #3

## 2018-01-24 MED FILL — DESLORATADINE 5 MG TAB: 5 | 90 days supply | Qty: 90 | Fill #0

## 2018-02-03 MED FILL — MONTELUKAST SOD 10 MG TAB: 10 | 90 days supply | Qty: 90 | Fill #0

## 2018-02-03 MED FILL — EPINEPHRINE 0.3 MG AUTO-INJ: 0.3 | 30 days supply | Qty: 2 | Fill #0

## 2018-02-21 ENCOUNTER — Encounter: Payer: Self-pay | Admitting: Neurology

## 2018-02-22 ENCOUNTER — Ambulatory Visit: Payer: No Typology Code available for payment source | Admitting: Neurology

## 2018-02-22 ENCOUNTER — Encounter: Payer: Self-pay | Admitting: Neurology

## 2018-02-22 VITALS — BP 106/77 | HR 68 | Ht 69.0 in | Wt 179.0 lb

## 2018-02-22 DIAGNOSIS — Z9884 Bariatric surgery status: Secondary | ICD-10-CM

## 2018-02-22 DIAGNOSIS — K2100 Gastro-esophageal reflux disease with esophagitis, without bleeding: Secondary | ICD-10-CM

## 2018-02-22 DIAGNOSIS — G478 Other sleep disorders: Secondary | ICD-10-CM

## 2018-02-22 DIAGNOSIS — R0683 Snoring: Secondary | ICD-10-CM

## 2018-02-22 DIAGNOSIS — N951 Menopausal and female climacteric states: Secondary | ICD-10-CM

## 2018-02-22 NOTE — Progress Notes (Addendum)
S[facility]INE CLINIC   Provider:  Larey Seat, MD   Primary Care Physician:  Rachell Cipro, MD    Chief Complaint  Patient presents with  . New Patient (Initial Visit)    pt alone, rm 10. pt states that she wears a bite guard and sees a dentist. during the assessment there was some concerns that wanted to have her evaluated for sleep concerns. she wakes up frequently through out the night. she complains of fatigue. she has been told she snores in sleep. never had a sleep study     HPI:  Colleen Schmitt is a 48 y.o. female patient , seen here on 02-22-2018 for a sleep consultation.  Chief complaint according to patient : " I used to sleep really well, and woke restored. Now I can be in bed, sleeping for 8 hours and want a nap right after. My energy levels are down. It may have changed with peri-menopausal symptoms, but I don't have hot flushes. "  Colleen Schmitt is a Caucasian right-handed 48 year old female who has noted symptoms of inattentiveness trouble to concentrate about 18 months to 12 months ago and was first thought to reflect ADD or ADHD.  After her recent change in primary care physicians she felt that it was more related to hormonal changes- perimenopausal , she was found to have low vitamin D levels and began supplementing these, and she has begun taking Wellbutrin in the morning and Prozac, leading to an improvement in her ability to focus yet her sleep has remained not as restorative and refreshing as it was in the past. In 2018 the patient underwent nasal plasty, septoplasty for chronic rhinitis congestion and postnasal drip.  Prior to that surgery she was a loud snorer, but snoring continued to be a problem since. She has bruxism, wears a bite guard. Her dentist has suggested to her to be evaluated for sleep apnea, and to adjust the bite guard accordingly.   Sleep habits are as follows: the patient returns from work and goes to the gym -  Between 6-7 PM is usual the  last meal of the day, bed time follows between  9.30 and 10.30 PM.  The bedroom is described as cool, quiet and dark.  She indicates that she prefers to sleep on her right side, on one pillow for head support.  There is no trouble with falling asleep, but waking up multiple times each night, sometimes gasping, sometimes snoring. Nocturia is rare.  She reports that she now dreams more intensely more frequently, but she does not arouse from nightmarish dreams and describes neither palpitations no diaphoresis.  No sleep related headaches described. She rises between 630 and 7:00, she usually wakes spontaneously on time to go to work.  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    Family sleep history: Patient's father has been diagnosed with sleep apnea, she does have an older  sister who is likely to have sleep disordered breathing.  The patient's mother has suffered from Alzheimer's disease, her maternal aunt had an estrogen sensitive form of breast cancer.  Social history:  Single, lives with her dog, works for a [employer], part of the patient's job assignment is community visits and she will visit communities as far away as [place] and drive.  non-smoker, she drinks alcohol socially, does not exceed 4 drinks a week and probably not more than 6/ months. She  drinks 1 cup of caffeinated coffee in the morning, does not consume ice tea hot tea or sodas.  Review of Systems: Out of a complete 14 system review, the patient complains of only the following symptoms, and all other reviewed systems are negative. Snoring, status post nasal surgery, post lap-band , TMJ click and pain, over- extendable joint.   Never checked for Drue Dun?     Epworth Sleepiness score : 13/ 24  points  , Fatigue severity score: 37/ 63   , depression score n/a    Social History   Socioeconomic History  . Marital status: Single     Spouse name: Not on file  . Number of children: Not on file  . Years of education: Not on file  . Highest education level: Not on file  Occupational History  . Not on file  Social Needs  . Financial resource strain: Not on file  . Food insecurity:    Worry: Not on file    Inability: Not on file  . Transportation needs:    Medical: Not on file    Non-medical: Not on file  Tobacco Use  . Smoking status: Never Smoker  . Smokeless tobacco: Never Used  Substance and Sexual Activity  . Alcohol use: Yes    Comment: socially  . Drug use: No  . Sexual activity: Not Currently    Birth control/protection: Abstinence    Comment: pt has not been sexually active since lmp. 05/14/16 JL  Lifestyle  . Physical activity:    Days per week: Not on file    Minutes per session: Not on file  . Stress: Not on file  Relationships  . Social connections:    Talks on phone: Not on file    Gets together: Not on file    Attends religious service: Not on file    Active member of club or organization: Not on file    Attends meetings of clubs or organizations: Not on file    Relationship status: Not on file  . Intimate partner violence:    Fear of current or ex partner: Not on file    Emotionally abused: Not on file    Physically abused: Not on file    Forced sexual activity: Not on file  Other Topics Concern  . Not on file  Social History Narrative  . Not on file    Family History  Problem Relation Age of Onset  . Cancer Paternal Grandfather        throat  . Osteoporosis Mother   . Alzheimer's disease Mother   . Hypertension Father   . Atrial fibrillation Father   . Hypertension Sister   . Mitral valve prolapse Sister   . Alzheimer's disease Maternal Grandfather     Past Medical History:  Diagnosis Date  . Abdominal pain   . Anxiety   . Asthma   . Contact lens/glasses fitting   . Cough   . Depression   . Fatigue   . GERD (gastroesophageal reflux disease)   . Hoarseness   . Poor  appetite     Past Surgical History:  Procedure Laterality Date  . CARPAL TUNNEL RELEASE Right 05/21/2016   Procedure: CARPAL TUNNEL RELEASE;  Surgeon: Roseanne Kaufman, MD;  Location: [place];  Service: Orthopedics;  Laterality: Right;  Requests 1 hr  . CARPAL TUNNEL RELEASE Left 09/24/2016   Procedure: Limited open CARPAL TUNNEL RELEASE;  Surgeon: Roseanne Kaufman, MD;  Location: [place];  Service: Orthopedics;  Laterality: Left;  . lap band  02/14/2006  . NASAL SEPTOPLASTY W/ TURBINOPLASTY N/A 05/21/2016   Procedure: NASAL SEPTOPLASTY WITH TURBINATE REDUCTION;  Surgeon: Leta Baptist, MD;  Location: [place];  Service: ENT;  Laterality: N/A;  . ORIF [place]   right  . SKIN BIOPSY      Current Outpatient Medications  Medication Sig Dispense Refill  . albuterol (PROVENTIL HFA;VENTOLIN HFA) 108 (90 Base) MCG/ACT inhaler Inhale 2 puffs into the lungs every 6 (six) hours as needed for wheezing or shortness of breath.    Marland Kitchen buPROPion (WELLBUTRIN XL) 150 MG 24 hr tablet     . Cholecalciferol (VITAMIN D3) 125 MCG (5000 UT) TABS Take 1 tablet by mouth daily.    Marland Kitchen desloratadine (CLARINEX) 5 MG tablet Take 5 mg by mouth daily.    Marland Kitchen dexlansoprazole (DEXILANT) 60 MG capsule Take 60 mg by mouth daily.    . famotidine (PEPCID) 20 MG tablet Take 20 mg by mouth 2 (two) times daily.    Marland Kitchen FLUoxetine (PROZAC) 10 MG capsule     . montelukast (SINGULAIR) 10 MG tablet Take 10 mg by mouth at bedtime.    . Multiple Vitamin (MULTIVITAMIN) tablet Take by mouth.    . Omega-3 Fatty Acids (OMEGA 3 500 PO) Take 1 capsule by mouth daily.    . Probiotic Product (PROBIOTIC DAILY) CAPS Take 1 capsule by mouth daily.    . ranitidine (ZANTAC) 300 MG tablet ranitidine 300 mg tablet    . triamcinolone (NASACORT) 55 MCG/ACT AERO nasal inhaler Place 2 sprays into the nose daily.    . Zinc 30 MG CAPS Take 1 capsule by mouth daily.     No current facility-administered  medications for this visit.     Allergies as of 02/22/2018 - Review Complete 09/24/2016  Allergen Reaction Noted  . Adhesive [tape] Rash 05/21/2016  . Penicillins Itching 01/22/2011    Vitals: BP 106/77   Pulse 68   Ht 5\' 9"  (1.753 m)   Wt 179 lb (81.2 kg)   BMI 26.43 kg/m  Last Weight:  Wt Readings from Last 1 Encounters:  02/22/18 179 lb (81.2 kg)   ZOX:WRUE mass index is 26.43 kg/m.     Last Height:   Ht Readings from Last 1 Encounters:  02/22/18 5\' 9"  (1.753 m)    Physical exam:  General: The patient is awake, alert and appears not in acute distress. The patient is well groomed. Head: Normocephalic, atraumatic. Neck is supple. Mallampati 2- elongated uvula with puffiness, redness- acid reflux?   neck circumference: 13. 75".  Nasal airflow patent, but speech is still nasal, TMJ click is evident. Crossbite , with Bruxism. Cardiovascular:  Regular rate and rhythm , without Heart murmurs or carotid bruit, and without distended neck veins. Respiratory: Lungs are clear to auscultation. Skin:  Without evidence of edema, or rash Trunk: BMI is 26.43 kg/m2 . The patient's posture is erect.     Neurologic exam : The patient is awake and alert, oriented to place and time.   Memory subjective described as intact.  Attention span & concentration ability appears normal.  Speech is fluent, without dysarthria, dysphonia or aphasia.  Mood and affect are appropriate.  Cranial nerves: Pupils are equal and briskly reactive to light. Extraocular movements  in vertical and horizontal planes intact and without nystagmus. Visual fields by finger perimetry are intact. Hearing to finger rub intact.  Facial sensation intact to fine touch. Facial  motor strength is symmetric and tongue and uvula move midline. Shoulder shrug was symmetrical.   Motor exam:  Normal tone, muscle bulk and symmetric strength in all extremities.double jointed- very limber, but over- extendable joint.  Sensory: Fine  touch, pinprick and vibration were normal.  Proprioception tested in the upper extremities was normal. Coordination: Rapid alternating movements in the fingers/hands was normal.  Finger-to-nose maneuver  normal without evidence of ataxia, dysmetria or tremor. Gait and station: Patient walks without assistive device .Turns with 3 Steps.  Deep tendon reflexes: in the upper and lower extremities are symmetric and intact.   Assessment:  After physical and neurologic examination, review of laboratory studies,  Personal review of imaging studies, reports of other /same  Imaging studies, results of polysomnography and / or neurophysiology testing and pre-existing records as far as provided in visit., my assessment is:    1) since Mrs. Fiallo indicated that she still snores, still has occasional nasal airflow problems and a dry mouth in the morning I would like to evaluate her for sleep apnea.  I am not sure that the need an attended sleep study I would rather lean towards a home sleep test as a screening test.  She endorsed an above average degree of fatigue and of sleepiness in her questionnaires in spite of getting 8 hours of rest.  2) The patient lost 30 pounds 3 years ago after a separation, but she feels not longer depressed.   3) Caregiver fatigue may play a role, mother has dementia at age 31, helps her father.   The patient was advised of the nature of the diagnosed disorder , the treatment options and the  risks for general health and wellness arising from not treating the condition.   I spent more than 45 minutes of face to face time with the patient.  Greater than 50% of time was spent in counseling and coordination of care. We have discussed the diagnosis and differential and I answered the patient's questions.    Plan:  Treatment plan and additional workup :  HST to screen for sleep apnea, cc Dr. Ernie Hew and Dentist Dr. Augustina Mood   Garnet [place] Brett Fairy, MD 3/88/8280, 0:34 PM  Certified  in Neurology by ABPN Certified in [place] by [facility] [address], [place], [place] [address]

## 2018-03-01 MED FILL — FLUoxetine HCL 10 MG CAPS: 10 | 90 days supply | Qty: 90 | Fill #2

## 2018-03-01 MED FILL — DEXILANT DR 60 MG CAPSULE: 60 | 30 days supply | Qty: 30 | Fill #4 | Status: TO

## 2018-03-01 MED FILL — buPROPion HCL ER (XL) 150 M: 150 | 90 days supply | Qty: 90 | Fill #3

## 2018-03-08 ENCOUNTER — Ambulatory Visit (INDEPENDENT_AMBULATORY_CARE_PROVIDER_SITE_OTHER): Payer: No Typology Code available for payment source | Admitting: Neurology

## 2018-03-08 DIAGNOSIS — G4733 Obstructive sleep apnea (adult) (pediatric): Secondary | ICD-10-CM

## 2018-03-08 DIAGNOSIS — R0683 Snoring: Secondary | ICD-10-CM

## 2018-03-08 DIAGNOSIS — N951 Menopausal and female climacteric states: Secondary | ICD-10-CM

## 2018-03-08 DIAGNOSIS — K2100 Gastro-esophageal reflux disease with esophagitis, without bleeding: Secondary | ICD-10-CM

## 2018-03-08 DIAGNOSIS — G478 Other sleep disorders: Secondary | ICD-10-CM

## 2018-03-08 DIAGNOSIS — Z9884 Bariatric surgery status: Secondary | ICD-10-CM

## 2018-03-17 DIAGNOSIS — G478 Other sleep disorders: Secondary | ICD-10-CM | POA: Insufficient documentation

## 2018-03-17 DIAGNOSIS — N951 Menopausal and female climacteric states: Secondary | ICD-10-CM | POA: Insufficient documentation

## 2018-03-17 DIAGNOSIS — R0683 Snoring: Secondary | ICD-10-CM | POA: Insufficient documentation

## 2018-03-17 DIAGNOSIS — G4733 Obstructive sleep apnea (adult) (pediatric): Secondary | ICD-10-CM | POA: Insufficient documentation

## 2018-03-17 NOTE — Procedures (Signed)
[facility] @[facility] [address] [place] [place], [place] [address] NAME:   Colleen Schmitt                                                                   DOB: 1970/10/27 MEDICAL RECORD No: [id number]                                                    DOS: 03/08/2018 REFERRING PHYSICIAN: Domenick Gong, MD STUDY PERFORMED: HST on [employer] HISTORY: [name] is a 48 y.o. female patient and was seen on 02-22-2018 for a sleep consultation. In 2018 the patient underwent septoplasty for chronic rhinitis congestion and postnasal drip.  Prior to that surgery she was a loud snorer, but snoring continued to be a problem since. She has bruxism, wears a bite guard. Her dentist has suggested to her to be evaluated for sleep apnea, and to adjust the bite guard accordingly.   Chief complaint according to patient: "I used to sleep really well, and woke restored. Now I can be in bed, sleeping for 8 hours and want a nap right after waking. My energy levels are down. It may relate to peri-menopausal symptoms, but I don't have hot flushes. " Colleen Schmitt is a Caucasian right-handed 48 year old female who has noted symptoms of inattentiveness trouble to concentrate about 18 months to 12 months ago which was first thought to reflect ADD or ADHD.  After her recent change in primary care physicians she felt that it was more related to hormonal changes- peri-menopausal, she was found to have low vitamin D levels and began supplementing Vit D, and she has begun taking Wellbutrin in the morning and Prozac daily, leading to an improvement in her ability to focus- yet her sleep has remained not as restorative and refreshing as it was in the past. Epworth Sleepiness score: 13/ 24 points, Fatigue severity score: 37/ 63 points, BMI:26.4    STUDY RESULTS:  Total Recording Time: 7 h 55 min; Calculated Sleep Time:  6 h 32 min. Total Apnea/Hypopnea Index (AHI):14.3 /h; RDI: 20.0/h; REM AHI:  18.4/h. Average Oxygen Saturation: 96%; Lowest Oxygen Desaturation: 89%.  Total Time in Oxygen Saturation below 89 %: 0.0 minutes.  Average Heart Rate: regular 59 bpm (between 46 and 86 bpm). IMPRESSION:  Mild- moderate sleep apnea of obstructive origin, AHI 14.3/h and REM AHI 18.4/h. No desaturation, nor brady-tachycardia. RECOMMENDATION: Loud snoring and mild -moderate OSA can be treated with a dental device or with CPAP. The patient is using a bite guard already and may get used to a mandibular advancement device easily.  I certify that I have reviewed the raw data recording prior to the issuance of this report in accordance with the standards of the American Academy of Sleep Medicine (AASM). Larey Seat, M.D.    03-17-2018    Medical Director of [facility] at [facility], accredited by the AASM. Diplomat of the ABPN and ABSM.

## 2018-03-20 ENCOUNTER — Telehealth: Payer: Self-pay | Admitting: Neurology

## 2018-03-20 DIAGNOSIS — R0683 Snoring: Secondary | ICD-10-CM

## 2018-03-20 DIAGNOSIS — G478 Other sleep disorders: Secondary | ICD-10-CM

## 2018-03-20 DIAGNOSIS — G4730 Sleep apnea, unspecified: Secondary | ICD-10-CM

## 2018-03-20 NOTE — Telephone Encounter (Signed)
Called the patient and reviewed her sleep study with her. Pt verbalized understanding and would like to go the dental device route as she has already spoke with Dr Augustina Mood in regards to having a dental device made. I will place the referral to Dr Toy Cookey for the patient per her request.Pt verbalized understanding. Pt had no questions at this time but was encouraged to call back if questions arise.

## 2018-03-20 NOTE — Telephone Encounter (Signed)
-----   Message from Larey Seat, MD sent at 03/17/2018  2:43 PM EST ----- PCP on Epic and the name of the referring physician are not identical. Dr. Rachell Cipro is referring/ PCP   The patient has apnea- the questions is if she wants to use a dental device or CPAP? She can go either way.  Which dentist would she want to use?   Cc Dr Ernie Hew

## 2018-04-03 MED FILL — DEXILANT DR 60 MG CAPSULE: 60 | 30 days supply | Qty: 30 | Fill #0

## 2018-04-05 ENCOUNTER — Encounter: Payer: Self-pay | Admitting: Neurology

## 2018-04-10 ENCOUNTER — Encounter: Payer: Self-pay | Admitting: Neurology

## 2018-04-10 ENCOUNTER — Telehealth: Payer: Self-pay | Admitting: Neurology

## 2018-04-10 NOTE — Telephone Encounter (Signed)
Colleen Schmitt from Dr. Janice Norrie office called stating that the pt is wanting a Oral appliance for her sleep apnea. Pt has [employer] and is needing a referral and a Auth in order to get it. Darrick Penna stated that she has written a letter if that would help with the process. Please advise.

## 2018-04-10 NOTE — Telephone Encounter (Signed)
Called shelley back. I have received the letter they use from the pt. I have printed and will fax to the patient's insurance for her.

## 2018-04-17 MED FILL — VENTOLIN HFA 90 MCG INHALER: 108 (90 BAS | 16 days supply | Qty: 18 | Fill #0

## 2018-04-17 MED FILL — MONTELUKAST SOD 10 MG TAB: 10 | 90 days supply | Qty: 90 | Fill #0

## 2018-04-17 MED FILL — DESLORATADINE 5 MG TAB: 5 | 90 days supply | Qty: 90 | Fill #0

## 2018-04-25 ENCOUNTER — Encounter: Payer: Self-pay | Admitting: Neurology

## 2018-04-25 ENCOUNTER — Other Ambulatory Visit: Payer: Self-pay | Admitting: Neurology

## 2018-04-25 DIAGNOSIS — G478 Other sleep disorders: Secondary | ICD-10-CM

## 2018-04-25 DIAGNOSIS — R0683 Snoring: Secondary | ICD-10-CM

## 2018-04-25 DIAGNOSIS — G4730 Sleep apnea, unspecified: Secondary | ICD-10-CM

## 2018-05-30 ENCOUNTER — Telehealth: Payer: Self-pay | Admitting: Neurology

## 2018-05-30 NOTE — Telephone Encounter (Signed)
Received a notification from [facility]   "Patient was ordered auto pap trial for 30 days on 04/25/18. Colleen Schmitt keyed as LOANER PAP. She was setup on 05/02/18 - she has not had it but 3 weeks.   Patient returned Loaner Pap today to the [facility] office.   Thought I would let you know so you could advise their office."

## 2018-06-01 MED FILL — FLUoxetine HCL 10 MG CAPS: 10 | 90 days supply | Qty: 90 | Fill #0

## 2018-06-01 MED FILL — buPROPion HCL ER (XL) 150 M: 150 | 90 days supply | Qty: 90 | Fill #0

## 2018-06-07 ENCOUNTER — Telehealth: Payer: Self-pay | Admitting: Neurology

## 2018-06-07 NOTE — Telephone Encounter (Signed)
Due to current COVID 19 pandemic, our office is severely reducing in office visits until further notice, in order to minimize the risk to our patients and healthcare providers.   Called patient and confirmed a virtual visit for her 6/1 appointment. Patient verbalized understanding of the [email or web address] process and I have sent her an e-mail with link and directions as well as my name and office number/hours for reference. Patient understands that she will receive a call from RN to update chart.  Pt understands that although there may be some limitations with this type of visit, we will take all precautions to reduce any security or privacy concerns.  Pt understands that this will be treated like an in office visit and we will file with pt's insurance, and there may be a patient responsible charge related to this service.

## 2018-06-08 ENCOUNTER — Encounter: Payer: Self-pay | Admitting: Neurology

## 2018-06-08 NOTE — Telephone Encounter (Signed)
Called the patient to review their chart and made sure that everything was up to date. Patient informed they received the e-mail/text message for the visit. Instructed to make sure they hold on to the e-mail/text for the upcoming appointment as it is necessary to access their appointment. Instructed the patient that apx 30 min prior to the appointment the front staff will contact them to make sure they are ready to go for their appointment in case there is any need for troubleshooting it can be completed prior to the appointment time. Reminded the patient once more that this is treated as a Office visit and the patient must be prepared for the visit and ready at the time of their appointment preferably in a well lit area where they have good connection for the visit. Pt verbalized understanding.   Pt turned in the loaner CPAP unable to tolerate it.

## 2018-06-12 ENCOUNTER — Ambulatory Visit: Payer: No Typology Code available for payment source | Admitting: Neurology

## 2018-06-12 ENCOUNTER — Encounter: Payer: Self-pay | Admitting: Neurology

## 2018-06-12 ENCOUNTER — Ambulatory Visit (INDEPENDENT_AMBULATORY_CARE_PROVIDER_SITE_OTHER): Payer: No Typology Code available for payment source | Admitting: Neurology

## 2018-06-12 ENCOUNTER — Other Ambulatory Visit: Payer: Self-pay

## 2018-06-12 DIAGNOSIS — G478 Other sleep disorders: Secondary | ICD-10-CM

## 2018-06-12 DIAGNOSIS — Z9884 Bariatric surgery status: Secondary | ICD-10-CM

## 2018-06-12 DIAGNOSIS — G4733 Obstructive sleep apnea (adult) (pediatric): Secondary | ICD-10-CM

## 2018-06-12 DIAGNOSIS — Z789 Other specified health status: Secondary | ICD-10-CM | POA: Diagnosis not present

## 2018-06-12 DIAGNOSIS — K2100 Gastro-esophageal reflux disease with esophagitis, without bleeding: Secondary | ICD-10-CM

## 2018-06-12 NOTE — Progress Notes (Signed)
Virtual Visit via Video Note  I connected with Colleen Schmitt on 06/12/18 at 10:30 AM EDT by a video enabled telemedicine application and verified that I am speaking with the correct person using two identifiers.  Location: Patient: at home  Provider: at [facility]    I discussed the limitations of evaluation and management by telemedicine and the availability of in person appointments. The patient expressed understanding and agreed to proceed.  History of Present Illness: patient was diagnosed after HST with mild OSA-  And has since been frustrated with CPAP, stopped using it. HST dated 03-08-2018  with mild OSA at AHI 14.3/h. STUDY RESULTS: Total Recording Time: 7 h 55 min; Calculated  Sleep Time: 6 h 32 min. Total Apnea/Hypopnea Index (AHI):14.3 /h; RDI: 20.0/h; REM AHI:  18.4/h. Average Oxygen Saturation: 96%; Lowest Oxygen Desaturation: 89%.  Total Time in Oxygen Saturation below 89 %: 0.0 minutes.  Average Heart Rate: regular 59 bpm (between 46 and 86 bpm). IMPRESSION: Mild- moderate sleep apnea of obstructive origin,  AHI 14.3/h and REM AHI 18.4/h. No desaturation, nor brady-tachycardia. RECOMMENDATION: Loud snoring and mild -moderate OSA can be  treated with a dental device or with CPAP. The patient is using a  bite guard already and may get used to a mandibular advancement  device easily.     The patient gulped air, aerophagia. This resulted in low CPAP sleep compliance. She has clautrophobia    Observations/Objective: patient is willing to undergo a dental device with Dr Augustina Mood, DDS.    Assessment and Plan: referral for dental device to use for OSA treatment, in patient with documented intolerance to CPAP and claustrophobia and aerophagia.    Follow Up Instructions: referral back to Augustina Mood, DDS.    I discussed the assessment and treatment plan with the patient. The patient was provided an opportunity to ask questions and all were answered. The patient agreed  with the plan and demonstrated an understanding of the instructions.   The patient was advised to call back or seek an in-person evaluation if the symptoms worsen or if the condition fails to improve as anticipated.  I provided 14 minutes of non-face-to-face time during this encounter.   Larey Seat, MD

## 2018-06-13 ENCOUNTER — Ambulatory Visit: Payer: No Typology Code available for payment source | Admitting: Neurology

## 2018-06-13 ENCOUNTER — Encounter: Payer: Self-pay | Admitting: Neurology

## 2018-07-24 MED FILL — MONTELUKAST SOD 10 MG TAB: 10 | 90 days supply | Qty: 90 | Fill #0

## 2018-07-24 MED FILL — DESLORATADINE 5 MG TAB: 5 | 90 days supply | Qty: 90 | Fill #0

## 2018-09-06 MED FILL — buPROPion HCL ER (XL) 150 M: 150 | 90 days supply | Qty: 90 | Fill #0

## 2018-09-12 MED FILL — FLUoxetine HCL 10 MG CAPS: 10 | 90 days supply | Qty: 90 | Fill #0

## 2018-09-13 MED FILL — DEXILANT DR 60 MG CAPSULE: 60 | 30 days supply | Qty: 30 | Fill #0

## 2018-10-04 ENCOUNTER — Other Ambulatory Visit: Payer: Self-pay | Admitting: Emergency Medicine

## 2018-10-04 DIAGNOSIS — Z20822 Contact with and (suspected) exposure to covid-19: Secondary | ICD-10-CM

## 2018-10-06 LAB — NOVEL CORONAVIRUS, NAA: SARS-CoV-2, NAA: NOT DETECTED

## 2018-10-27 MED FILL — FLUOROURACIL 5 % CREA: 5 | 30 days supply | Qty: 40 | Fill #0

## 2018-10-30 MED FILL — DESLORATADINE 5 MG TABS: 5 | 90 days supply | Qty: 90 | Fill #0

## 2018-10-30 MED FILL — MONTELUKAST SOD 10 MG TAB: 10 | 90 days supply | Qty: 90 | Fill #0

## 2018-12-14 MED FILL — FLUoxetine HCL 10 MG CAPS: 10 | 90 days supply | Qty: 90 | Fill #1

## 2018-12-14 MED FILL — buPROPion HCL ER (XL) 150 M: 150 | 90 days supply | Qty: 90 | Fill #1

## 2018-12-14 MED FILL — DEXILANT DR 60 MG CAPSULE: 60 | 30 days supply | Qty: 30 | Fill #1

## 2018-12-29 MED FILL — PHENTERMINE 15 MG CAPSULE: 15 | 30 days supply | Qty: 30 | Fill #0

## 2018-12-29 MED FILL — FLUTICASONE PROP 50 MCG SPR: 50 | 30 days supply | Qty: 16 | Fill #0

## 2018-12-29 MED FILL — AZELASTINE HCL 137 MCG SPRY: 0.1 | 30 days supply | Qty: 30 | Fill #0

## 2019-01-18 DIAGNOSIS — J301 Allergic rhinitis due to pollen: Secondary | ICD-10-CM | POA: Diagnosis not present

## 2019-01-18 DIAGNOSIS — Z008 Encounter for other general examination: Secondary | ICD-10-CM | POA: Diagnosis not present

## 2019-01-18 DIAGNOSIS — J3089 Other allergic rhinitis: Secondary | ICD-10-CM | POA: Diagnosis not present

## 2019-01-18 DIAGNOSIS — F32A Depression, unspecified: Secondary | ICD-10-CM | POA: Diagnosis not present

## 2019-01-18 DIAGNOSIS — J3081 Allergic rhinitis due to animal (cat) (dog) hair and dander: Secondary | ICD-10-CM | POA: Diagnosis not present

## 2019-01-19 ENCOUNTER — Ambulatory Visit: Payer: No Typology Code available for payment source | Attending: Internal Medicine

## 2019-01-19 DIAGNOSIS — Z20822 Contact with and (suspected) exposure to covid-19: Secondary | ICD-10-CM

## 2019-01-21 LAB — NOVEL CORONAVIRUS, NAA: SARS-CoV-2, NAA: NOT DETECTED

## 2019-01-26 DIAGNOSIS — Z6827 Body mass index (BMI) 27.0-27.9, adult: Secondary | ICD-10-CM | POA: Diagnosis not present

## 2019-01-26 DIAGNOSIS — F331 Major depressive disorder, recurrent, moderate: Secondary | ICD-10-CM | POA: Diagnosis not present

## 2019-01-26 DIAGNOSIS — F411 Generalized anxiety disorder: Secondary | ICD-10-CM | POA: Diagnosis not present

## 2019-01-26 DIAGNOSIS — E663 Overweight: Secondary | ICD-10-CM | POA: Diagnosis not present

## 2019-01-30 MED FILL — PHENTERMINE 15 MG CAPSULE: 15 | 30 days supply | Qty: 30 | Fill #0

## 2019-01-30 MED FILL — MONTELUKAST SOD 10 MG TAB: 10 | 90 days supply | Qty: 90 | Fill #0

## 2019-02-01 DIAGNOSIS — Z008 Encounter for other general examination: Secondary | ICD-10-CM | POA: Diagnosis not present

## 2019-02-01 DIAGNOSIS — J301 Allergic rhinitis due to pollen: Secondary | ICD-10-CM | POA: Diagnosis not present

## 2019-02-01 DIAGNOSIS — J3081 Allergic rhinitis due to animal (cat) (dog) hair and dander: Secondary | ICD-10-CM | POA: Diagnosis not present

## 2019-02-01 DIAGNOSIS — F32A Depression, unspecified: Secondary | ICD-10-CM | POA: Diagnosis not present

## 2019-02-01 DIAGNOSIS — J3089 Other allergic rhinitis: Secondary | ICD-10-CM | POA: Diagnosis not present

## 2019-02-08 DIAGNOSIS — J301 Allergic rhinitis due to pollen: Secondary | ICD-10-CM | POA: Diagnosis not present

## 2019-02-08 DIAGNOSIS — J3081 Allergic rhinitis due to animal (cat) (dog) hair and dander: Secondary | ICD-10-CM | POA: Diagnosis not present

## 2019-02-08 DIAGNOSIS — J3089 Other allergic rhinitis: Secondary | ICD-10-CM | POA: Diagnosis not present

## 2019-02-15 DIAGNOSIS — F32A Depression, unspecified: Secondary | ICD-10-CM | POA: Diagnosis not present

## 2019-02-15 DIAGNOSIS — Z008 Encounter for other general examination: Secondary | ICD-10-CM | POA: Diagnosis not present

## 2019-02-20 DIAGNOSIS — J3081 Allergic rhinitis due to animal (cat) (dog) hair and dander: Secondary | ICD-10-CM | POA: Diagnosis not present

## 2019-02-20 DIAGNOSIS — J3089 Other allergic rhinitis: Secondary | ICD-10-CM | POA: Diagnosis not present

## 2019-02-20 DIAGNOSIS — J301 Allergic rhinitis due to pollen: Secondary | ICD-10-CM | POA: Diagnosis not present

## 2019-02-26 DIAGNOSIS — Z6827 Body mass index (BMI) 27.0-27.9, adult: Secondary | ICD-10-CM | POA: Diagnosis not present

## 2019-02-26 DIAGNOSIS — E663 Overweight: Secondary | ICD-10-CM | POA: Diagnosis not present

## 2019-03-01 DIAGNOSIS — Z008 Encounter for other general examination: Secondary | ICD-10-CM | POA: Diagnosis not present

## 2019-03-01 DIAGNOSIS — F32A Depression, unspecified: Secondary | ICD-10-CM | POA: Diagnosis not present

## 2019-03-01 MED FILL — PHENTERMINE 15 MG CAPSULE: 15 | 30 days supply | Qty: 30 | Fill #0

## 2019-03-02 DIAGNOSIS — J3081 Allergic rhinitis due to animal (cat) (dog) hair and dander: Secondary | ICD-10-CM | POA: Diagnosis not present

## 2019-03-02 DIAGNOSIS — J3089 Other allergic rhinitis: Secondary | ICD-10-CM | POA: Diagnosis not present

## 2019-03-02 DIAGNOSIS — J301 Allergic rhinitis due to pollen: Secondary | ICD-10-CM | POA: Diagnosis not present

## 2019-03-07 DIAGNOSIS — J3089 Other allergic rhinitis: Secondary | ICD-10-CM | POA: Diagnosis not present

## 2019-03-07 DIAGNOSIS — J3081 Allergic rhinitis due to animal (cat) (dog) hair and dander: Secondary | ICD-10-CM | POA: Diagnosis not present

## 2019-03-14 MED FILL — FLUoxetine HCL 10 MG CAPS: 10 | 90 days supply | Qty: 90 | Fill #0

## 2019-03-14 MED FILL — buPROPion HCL ER (XL) 150 M: 150 | 90 days supply | Qty: 90 | Fill #0

## 2019-03-15 ENCOUNTER — Encounter: Payer: Self-pay | Admitting: Neurology

## 2019-03-15 DIAGNOSIS — J301 Allergic rhinitis due to pollen: Secondary | ICD-10-CM | POA: Diagnosis not present

## 2019-03-15 DIAGNOSIS — F32A Depression, unspecified: Secondary | ICD-10-CM | POA: Diagnosis not present

## 2019-03-15 DIAGNOSIS — J3089 Other allergic rhinitis: Secondary | ICD-10-CM | POA: Diagnosis not present

## 2019-03-15 DIAGNOSIS — J3081 Allergic rhinitis due to animal (cat) (dog) hair and dander: Secondary | ICD-10-CM | POA: Diagnosis not present

## 2019-03-16 ENCOUNTER — Encounter: Payer: Self-pay | Admitting: Neurology

## 2019-03-23 DIAGNOSIS — J3081 Allergic rhinitis due to animal (cat) (dog) hair and dander: Secondary | ICD-10-CM | POA: Diagnosis not present

## 2019-03-23 DIAGNOSIS — J3089 Other allergic rhinitis: Secondary | ICD-10-CM | POA: Diagnosis not present

## 2019-03-26 DIAGNOSIS — D72819 Decreased white blood cell count, unspecified: Secondary | ICD-10-CM | POA: Diagnosis not present

## 2019-03-30 DIAGNOSIS — D72819 Decreased white blood cell count, unspecified: Secondary | ICD-10-CM | POA: Diagnosis not present

## 2019-03-30 DIAGNOSIS — E663 Overweight: Secondary | ICD-10-CM | POA: Diagnosis not present

## 2019-03-30 DIAGNOSIS — F411 Generalized anxiety disorder: Secondary | ICD-10-CM | POA: Diagnosis not present

## 2019-03-30 MED FILL — PHENTERMINE 30 MG CAPSULE: 30 | 30 days supply | Qty: 30 | Fill #0

## 2019-04-12 DIAGNOSIS — F32A Depression, unspecified: Secondary | ICD-10-CM | POA: Diagnosis not present

## 2019-04-12 DIAGNOSIS — J3089 Other allergic rhinitis: Secondary | ICD-10-CM | POA: Diagnosis not present

## 2019-04-12 DIAGNOSIS — J3081 Allergic rhinitis due to animal (cat) (dog) hair and dander: Secondary | ICD-10-CM | POA: Diagnosis not present

## 2019-04-12 DIAGNOSIS — J301 Allergic rhinitis due to pollen: Secondary | ICD-10-CM | POA: Diagnosis not present

## 2019-04-24 DIAGNOSIS — J3081 Allergic rhinitis due to animal (cat) (dog) hair and dander: Secondary | ICD-10-CM | POA: Diagnosis not present

## 2019-04-24 DIAGNOSIS — J3089 Other allergic rhinitis: Secondary | ICD-10-CM | POA: Diagnosis not present

## 2019-04-24 DIAGNOSIS — J301 Allergic rhinitis due to pollen: Secondary | ICD-10-CM | POA: Diagnosis not present

## 2019-04-26 DIAGNOSIS — F32A Depression, unspecified: Secondary | ICD-10-CM | POA: Diagnosis not present

## 2019-05-08 MED FILL — MONTELUKAST SOD 10 MG TAB: 10 | 90 days supply | Qty: 90 | Fill #1

## 2019-05-10 DIAGNOSIS — J3089 Other allergic rhinitis: Secondary | ICD-10-CM | POA: Diagnosis not present

## 2019-05-10 DIAGNOSIS — F32A Depression, unspecified: Secondary | ICD-10-CM | POA: Diagnosis not present

## 2019-05-10 DIAGNOSIS — J301 Allergic rhinitis due to pollen: Secondary | ICD-10-CM | POA: Diagnosis not present

## 2019-05-10 DIAGNOSIS — J3081 Allergic rhinitis due to animal (cat) (dog) hair and dander: Secondary | ICD-10-CM | POA: Diagnosis not present

## 2019-05-24 DIAGNOSIS — J301 Allergic rhinitis due to pollen: Secondary | ICD-10-CM | POA: Diagnosis not present

## 2019-05-24 DIAGNOSIS — J3081 Allergic rhinitis due to animal (cat) (dog) hair and dander: Secondary | ICD-10-CM | POA: Diagnosis not present

## 2019-05-24 DIAGNOSIS — J3089 Other allergic rhinitis: Secondary | ICD-10-CM | POA: Diagnosis not present

## 2019-05-24 DIAGNOSIS — F32A Depression, unspecified: Secondary | ICD-10-CM | POA: Diagnosis not present

## 2019-05-30 MED FILL — buPROPion HCL ER (XL) 150 M: 150 | 90 days supply | Qty: 90 | Fill #1

## 2019-05-30 MED FILL — FLUoxetine HCL 10 MG CAPS: 10 | 90 days supply | Qty: 90 | Fill #1

## 2019-05-31 ENCOUNTER — Other Ambulatory Visit (HOSPITAL_COMMUNITY): Payer: Self-pay | Admitting: Family Medicine

## 2019-05-31 DIAGNOSIS — M25532 Pain in left wrist: Secondary | ICD-10-CM | POA: Diagnosis not present

## 2019-05-31 DIAGNOSIS — F411 Generalized anxiety disorder: Secondary | ICD-10-CM | POA: Diagnosis not present

## 2019-05-31 DIAGNOSIS — E663 Overweight: Secondary | ICD-10-CM | POA: Diagnosis not present

## 2019-05-31 DIAGNOSIS — J309 Allergic rhinitis, unspecified: Secondary | ICD-10-CM | POA: Diagnosis not present

## 2019-05-31 MED FILL — PHENTERMINE 30 MG CAPSULE: 30 | 30 days supply | Qty: 30 | Fill #0

## 2019-05-31 MED FILL — LEVOCETIRIZINE 5 MG TABLET: 5 | 90 days supply | Qty: 90 | Fill #0

## 2019-06-06 DIAGNOSIS — J301 Allergic rhinitis due to pollen: Secondary | ICD-10-CM | POA: Diagnosis not present

## 2019-06-06 DIAGNOSIS — J3089 Other allergic rhinitis: Secondary | ICD-10-CM | POA: Diagnosis not present

## 2019-06-06 DIAGNOSIS — J3081 Allergic rhinitis due to animal (cat) (dog) hair and dander: Secondary | ICD-10-CM | POA: Diagnosis not present

## 2019-06-07 DIAGNOSIS — F32A Depression, unspecified: Secondary | ICD-10-CM | POA: Diagnosis not present

## 2019-06-20 DIAGNOSIS — J301 Allergic rhinitis due to pollen: Secondary | ICD-10-CM | POA: Diagnosis not present

## 2019-06-20 DIAGNOSIS — J3081 Allergic rhinitis due to animal (cat) (dog) hair and dander: Secondary | ICD-10-CM | POA: Diagnosis not present

## 2019-06-20 DIAGNOSIS — J3089 Other allergic rhinitis: Secondary | ICD-10-CM | POA: Diagnosis not present

## 2019-06-28 DIAGNOSIS — J3081 Allergic rhinitis due to animal (cat) (dog) hair and dander: Secondary | ICD-10-CM | POA: Diagnosis not present

## 2019-06-28 DIAGNOSIS — F32A Depression, unspecified: Secondary | ICD-10-CM | POA: Diagnosis not present

## 2019-06-28 DIAGNOSIS — J301 Allergic rhinitis due to pollen: Secondary | ICD-10-CM | POA: Diagnosis not present

## 2019-06-28 DIAGNOSIS — J3089 Other allergic rhinitis: Secondary | ICD-10-CM | POA: Diagnosis not present

## 2019-07-11 DIAGNOSIS — J3081 Allergic rhinitis due to animal (cat) (dog) hair and dander: Secondary | ICD-10-CM | POA: Diagnosis not present

## 2019-07-11 DIAGNOSIS — J301 Allergic rhinitis due to pollen: Secondary | ICD-10-CM | POA: Diagnosis not present

## 2019-07-11 DIAGNOSIS — J3089 Other allergic rhinitis: Secondary | ICD-10-CM | POA: Diagnosis not present

## 2019-07-26 DIAGNOSIS — J3081 Allergic rhinitis due to animal (cat) (dog) hair and dander: Secondary | ICD-10-CM | POA: Diagnosis not present

## 2019-07-26 DIAGNOSIS — J301 Allergic rhinitis due to pollen: Secondary | ICD-10-CM | POA: Diagnosis not present

## 2019-07-26 DIAGNOSIS — J3089 Other allergic rhinitis: Secondary | ICD-10-CM | POA: Diagnosis not present

## 2019-07-26 DIAGNOSIS — F32A Depression, unspecified: Secondary | ICD-10-CM | POA: Diagnosis not present

## 2019-08-08 DIAGNOSIS — J301 Allergic rhinitis due to pollen: Secondary | ICD-10-CM | POA: Diagnosis not present

## 2019-08-08 DIAGNOSIS — J3081 Allergic rhinitis due to animal (cat) (dog) hair and dander: Secondary | ICD-10-CM | POA: Diagnosis not present

## 2019-08-08 DIAGNOSIS — J3089 Other allergic rhinitis: Secondary | ICD-10-CM | POA: Diagnosis not present

## 2019-08-10 MED FILL — MONTELUKAST SOD 10 MG TAB: 10 | 90 days supply | Qty: 90 | Fill #2

## 2019-08-21 ENCOUNTER — Other Ambulatory Visit (HOSPITAL_COMMUNITY): Payer: Self-pay | Admitting: Gastroenterology

## 2019-08-21 DIAGNOSIS — K5730 Diverticulosis of large intestine without perforation or abscess without bleeding: Secondary | ICD-10-CM | POA: Diagnosis not present

## 2019-08-21 DIAGNOSIS — K440 Diaphragmatic hernia with obstruction, without gangrene: Secondary | ICD-10-CM | POA: Diagnosis not present

## 2019-08-21 DIAGNOSIS — K219 Gastro-esophageal reflux disease without esophagitis: Secondary | ICD-10-CM | POA: Diagnosis not present

## 2019-08-21 DIAGNOSIS — Z86010 Personal history of colonic polyps: Secondary | ICD-10-CM | POA: Diagnosis not present

## 2019-08-21 DIAGNOSIS — R112 Nausea with vomiting, unspecified: Secondary | ICD-10-CM | POA: Diagnosis not present

## 2019-08-21 MED FILL — OMEPRAZOLE 40 MG CPDR: 40 | 90 days supply | Qty: 90 | Fill #0

## 2019-08-30 DIAGNOSIS — F32A Depression, unspecified: Secondary | ICD-10-CM | POA: Diagnosis not present

## 2019-08-30 DIAGNOSIS — Z6828 Body mass index (BMI) 28.0-28.9, adult: Secondary | ICD-10-CM | POA: Diagnosis not present

## 2019-08-30 DIAGNOSIS — Z01419 Encounter for gynecological examination (general) (routine) without abnormal findings: Secondary | ICD-10-CM | POA: Diagnosis not present

## 2019-08-30 DIAGNOSIS — Z1231 Encounter for screening mammogram for malignant neoplasm of breast: Secondary | ICD-10-CM | POA: Diagnosis not present

## 2019-08-31 DIAGNOSIS — J3089 Other allergic rhinitis: Secondary | ICD-10-CM | POA: Diagnosis not present

## 2019-08-31 DIAGNOSIS — J3081 Allergic rhinitis due to animal (cat) (dog) hair and dander: Secondary | ICD-10-CM | POA: Diagnosis not present

## 2019-08-31 DIAGNOSIS — J301 Allergic rhinitis due to pollen: Secondary | ICD-10-CM | POA: Diagnosis not present

## 2019-09-04 DIAGNOSIS — J301 Allergic rhinitis due to pollen: Secondary | ICD-10-CM | POA: Diagnosis not present

## 2019-09-04 MED FILL — FLUoxetine HCL 10 MG CAPS: 10 | 90 days supply | Qty: 90 | Fill #2

## 2019-09-04 MED FILL — LEVOCETIRIZINE 5 MG TABLET: 5 | 90 days supply | Qty: 90 | Fill #1

## 2019-09-04 MED FILL — buPROPion HCL ER (XL) 150 M: 150 | 90 days supply | Qty: 90 | Fill #2

## 2019-09-05 DIAGNOSIS — J3081 Allergic rhinitis due to animal (cat) (dog) hair and dander: Secondary | ICD-10-CM | POA: Diagnosis not present

## 2019-09-05 DIAGNOSIS — J3089 Other allergic rhinitis: Secondary | ICD-10-CM | POA: Diagnosis not present

## 2019-09-05 DIAGNOSIS — Z09 Encounter for follow-up examination after completed treatment for conditions other than malignant neoplasm: Secondary | ICD-10-CM | POA: Diagnosis not present

## 2019-09-05 DIAGNOSIS — Z0000 Encounter for general adult medical examination without abnormal findings: Secondary | ICD-10-CM | POA: Diagnosis not present

## 2019-09-13 DIAGNOSIS — J301 Allergic rhinitis due to pollen: Secondary | ICD-10-CM | POA: Diagnosis not present

## 2019-09-13 DIAGNOSIS — J3089 Other allergic rhinitis: Secondary | ICD-10-CM | POA: Diagnosis not present

## 2019-09-13 DIAGNOSIS — J3081 Allergic rhinitis due to animal (cat) (dog) hair and dander: Secondary | ICD-10-CM | POA: Diagnosis not present

## 2019-09-23 DIAGNOSIS — F32A Depression, unspecified: Secondary | ICD-10-CM | POA: Diagnosis not present

## 2019-09-25 DIAGNOSIS — F418 Other specified anxiety disorders: Secondary | ICD-10-CM | POA: Diagnosis not present

## 2019-09-25 DIAGNOSIS — F329 Major depressive disorder, single episode, unspecified: Secondary | ICD-10-CM | POA: Diagnosis not present

## 2019-09-25 DIAGNOSIS — Z6828 Body mass index (BMI) 28.0-28.9, adult: Secondary | ICD-10-CM | POA: Diagnosis not present

## 2019-09-25 DIAGNOSIS — Z23 Encounter for immunization: Secondary | ICD-10-CM | POA: Diagnosis not present

## 2019-09-25 DIAGNOSIS — Z780 Asymptomatic menopausal state: Secondary | ICD-10-CM | POA: Diagnosis not present

## 2019-09-25 DIAGNOSIS — Z0000 Encounter for general adult medical examination without abnormal findings: Secondary | ICD-10-CM | POA: Diagnosis not present

## 2019-09-27 ENCOUNTER — Other Ambulatory Visit: Payer: Self-pay | Admitting: Family Medicine

## 2019-09-27 DIAGNOSIS — F32A Depression, unspecified: Secondary | ICD-10-CM | POA: Diagnosis not present

## 2019-09-27 DIAGNOSIS — E289 Ovarian dysfunction, unspecified: Secondary | ICD-10-CM

## 2019-10-02 ENCOUNTER — Other Ambulatory Visit (HOSPITAL_COMMUNITY): Payer: Self-pay | Admitting: Family Medicine

## 2019-10-02 DIAGNOSIS — G4733 Obstructive sleep apnea (adult) (pediatric): Secondary | ICD-10-CM | POA: Diagnosis not present

## 2019-10-02 DIAGNOSIS — E785 Hyperlipidemia, unspecified: Secondary | ICD-10-CM | POA: Diagnosis not present

## 2019-10-02 DIAGNOSIS — R7303 Prediabetes: Secondary | ICD-10-CM | POA: Diagnosis not present

## 2019-10-02 DIAGNOSIS — K219 Gastro-esophageal reflux disease without esophagitis: Secondary | ICD-10-CM | POA: Diagnosis not present

## 2019-10-02 DIAGNOSIS — Z6828 Body mass index (BMI) 28.0-28.9, adult: Secondary | ICD-10-CM | POA: Diagnosis not present

## 2019-10-05 DIAGNOSIS — J301 Allergic rhinitis due to pollen: Secondary | ICD-10-CM | POA: Diagnosis not present

## 2019-10-05 DIAGNOSIS — J3081 Allergic rhinitis due to animal (cat) (dog) hair and dander: Secondary | ICD-10-CM | POA: Diagnosis not present

## 2019-10-05 DIAGNOSIS — J3089 Other allergic rhinitis: Secondary | ICD-10-CM | POA: Diagnosis not present

## 2019-10-09 DIAGNOSIS — J3081 Allergic rhinitis due to animal (cat) (dog) hair and dander: Secondary | ICD-10-CM | POA: Diagnosis not present

## 2019-10-09 DIAGNOSIS — J3089 Other allergic rhinitis: Secondary | ICD-10-CM | POA: Diagnosis not present

## 2019-10-09 DIAGNOSIS — J301 Allergic rhinitis due to pollen: Secondary | ICD-10-CM | POA: Diagnosis not present

## 2019-10-11 DIAGNOSIS — F32A Depression, unspecified: Secondary | ICD-10-CM | POA: Diagnosis not present

## 2019-10-17 DIAGNOSIS — J301 Allergic rhinitis due to pollen: Secondary | ICD-10-CM | POA: Diagnosis not present

## 2019-10-17 DIAGNOSIS — J3089 Other allergic rhinitis: Secondary | ICD-10-CM | POA: Diagnosis not present

## 2019-10-17 DIAGNOSIS — J3081 Allergic rhinitis due to animal (cat) (dog) hair and dander: Secondary | ICD-10-CM | POA: Diagnosis not present

## 2019-10-25 DIAGNOSIS — F32A Depression, unspecified: Secondary | ICD-10-CM | POA: Diagnosis not present

## 2019-11-01 DIAGNOSIS — J3081 Allergic rhinitis due to animal (cat) (dog) hair and dander: Secondary | ICD-10-CM | POA: Diagnosis not present

## 2019-11-01 DIAGNOSIS — J301 Allergic rhinitis due to pollen: Secondary | ICD-10-CM | POA: Diagnosis not present

## 2019-11-01 DIAGNOSIS — J3089 Other allergic rhinitis: Secondary | ICD-10-CM | POA: Diagnosis not present

## 2019-11-06 MED FILL — WEGOVY 1 MG/0.5ML SOAJ: 1 | 28 days supply | Qty: 2 | Fill #0

## 2019-11-08 DIAGNOSIS — F32A Depression, unspecified: Secondary | ICD-10-CM | POA: Diagnosis not present

## 2019-11-08 DIAGNOSIS — J3089 Other allergic rhinitis: Secondary | ICD-10-CM | POA: Diagnosis not present

## 2019-11-08 DIAGNOSIS — J3081 Allergic rhinitis due to animal (cat) (dog) hair and dander: Secondary | ICD-10-CM | POA: Diagnosis not present

## 2019-11-08 DIAGNOSIS — J301 Allergic rhinitis due to pollen: Secondary | ICD-10-CM | POA: Diagnosis not present

## 2019-11-08 MED FILL — MONTELUKAST SOD 10 MG TAB: 10 | 90 days supply | Qty: 90 | Fill #3

## 2019-11-14 DIAGNOSIS — J3081 Allergic rhinitis due to animal (cat) (dog) hair and dander: Secondary | ICD-10-CM | POA: Diagnosis not present

## 2019-11-14 DIAGNOSIS — J301 Allergic rhinitis due to pollen: Secondary | ICD-10-CM | POA: Diagnosis not present

## 2019-11-14 DIAGNOSIS — J3089 Other allergic rhinitis: Secondary | ICD-10-CM | POA: Diagnosis not present

## 2019-11-16 DIAGNOSIS — J3081 Allergic rhinitis due to animal (cat) (dog) hair and dander: Secondary | ICD-10-CM | POA: Diagnosis not present

## 2019-11-16 DIAGNOSIS — J3089 Other allergic rhinitis: Secondary | ICD-10-CM | POA: Diagnosis not present

## 2019-11-19 DIAGNOSIS — J3081 Allergic rhinitis due to animal (cat) (dog) hair and dander: Secondary | ICD-10-CM | POA: Diagnosis not present

## 2019-11-19 DIAGNOSIS — J301 Allergic rhinitis due to pollen: Secondary | ICD-10-CM | POA: Diagnosis not present

## 2019-11-19 DIAGNOSIS — J3089 Other allergic rhinitis: Secondary | ICD-10-CM | POA: Diagnosis not present

## 2019-11-22 DIAGNOSIS — J301 Allergic rhinitis due to pollen: Secondary | ICD-10-CM | POA: Diagnosis not present

## 2019-11-22 DIAGNOSIS — J3081 Allergic rhinitis due to animal (cat) (dog) hair and dander: Secondary | ICD-10-CM | POA: Diagnosis not present

## 2019-11-22 DIAGNOSIS — F32A Depression, unspecified: Secondary | ICD-10-CM | POA: Diagnosis not present

## 2019-11-22 DIAGNOSIS — J3089 Other allergic rhinitis: Secondary | ICD-10-CM | POA: Diagnosis not present

## 2019-11-27 DIAGNOSIS — Z23 Encounter for immunization: Secondary | ICD-10-CM | POA: Diagnosis not present

## 2019-12-03 MED FILL — FLUoxetine HCL 10 MG CAPS: 10 | 90 days supply | Qty: 90 | Fill #3

## 2019-12-03 MED FILL — buPROPion HCL ER (XL) 150 M: 150 | 90 days supply | Qty: 90 | Fill #3

## 2019-12-03 MED FILL — LEVOCETIRIZINE 5 MG TABLET: 5 | 90 days supply | Qty: 90 | Fill #2

## 2019-12-03 MED FILL — OMEPRAZOLE 40 MG CPDR: 40 | 90 days supply | Qty: 90 | Fill #1

## 2019-12-04 DIAGNOSIS — E669 Obesity, unspecified: Secondary | ICD-10-CM | POA: Diagnosis not present

## 2019-12-04 DIAGNOSIS — G4733 Obstructive sleep apnea (adult) (pediatric): Secondary | ICD-10-CM | POA: Diagnosis not present

## 2019-12-04 DIAGNOSIS — E785 Hyperlipidemia, unspecified: Secondary | ICD-10-CM | POA: Diagnosis not present

## 2019-12-04 DIAGNOSIS — J3089 Other allergic rhinitis: Secondary | ICD-10-CM | POA: Diagnosis not present

## 2019-12-04 DIAGNOSIS — R7303 Prediabetes: Secondary | ICD-10-CM | POA: Diagnosis not present

## 2019-12-04 DIAGNOSIS — J301 Allergic rhinitis due to pollen: Secondary | ICD-10-CM | POA: Diagnosis not present

## 2019-12-04 DIAGNOSIS — J3081 Allergic rhinitis due to animal (cat) (dog) hair and dander: Secondary | ICD-10-CM | POA: Diagnosis not present

## 2019-12-04 DIAGNOSIS — K219 Gastro-esophageal reflux disease without esophagitis: Secondary | ICD-10-CM | POA: Diagnosis not present

## 2019-12-04 DIAGNOSIS — K5900 Constipation, unspecified: Secondary | ICD-10-CM | POA: Diagnosis not present

## 2019-12-04 MED FILL — WEGOVY 1 MG/0.5ML SOAJ: 1 | 28 days supply | Qty: 2 | Fill #0

## 2019-12-13 DIAGNOSIS — F32A Depression, unspecified: Secondary | ICD-10-CM | POA: Diagnosis not present

## 2019-12-13 DIAGNOSIS — J3081 Allergic rhinitis due to animal (cat) (dog) hair and dander: Secondary | ICD-10-CM | POA: Diagnosis not present

## 2019-12-13 DIAGNOSIS — J301 Allergic rhinitis due to pollen: Secondary | ICD-10-CM | POA: Diagnosis not present

## 2019-12-13 DIAGNOSIS — J3089 Other allergic rhinitis: Secondary | ICD-10-CM | POA: Diagnosis not present

## 2019-12-14 ENCOUNTER — Ambulatory Visit
Admission: RE | Admit: 2019-12-14 | Discharge: 2019-12-14 | Disposition: A | Discharge status: Home / Self Care | Payer: No Typology Code available for payment source | Source: Ambulatory Visit | Attending: Family Medicine | Admitting: Family Medicine

## 2019-12-14 ENCOUNTER — Other Ambulatory Visit: Payer: Self-pay

## 2019-12-14 DIAGNOSIS — E289 Ovarian dysfunction, unspecified: Secondary | ICD-10-CM

## 2019-12-14 DIAGNOSIS — M85831 Other specified disorders of bone density and structure, right forearm: Secondary | ICD-10-CM | POA: Diagnosis not present

## 2019-12-27 DIAGNOSIS — F32A Depression, unspecified: Secondary | ICD-10-CM | POA: Diagnosis not present

## 2019-12-28 DIAGNOSIS — J301 Allergic rhinitis due to pollen: Secondary | ICD-10-CM | POA: Diagnosis not present

## 2019-12-28 DIAGNOSIS — J3081 Allergic rhinitis due to animal (cat) (dog) hair and dander: Secondary | ICD-10-CM | POA: Diagnosis not present

## 2019-12-28 DIAGNOSIS — J3089 Other allergic rhinitis: Secondary | ICD-10-CM | POA: Diagnosis not present

## 2019-12-31 DIAGNOSIS — E559 Vitamin D deficiency, unspecified: Secondary | ICD-10-CM | POA: Diagnosis not present

## 2020-01-01 DIAGNOSIS — E785 Hyperlipidemia, unspecified: Secondary | ICD-10-CM | POA: Diagnosis not present

## 2020-01-01 DIAGNOSIS — E669 Obesity, unspecified: Secondary | ICD-10-CM | POA: Diagnosis not present

## 2020-01-01 DIAGNOSIS — M8580 Other specified disorders of bone density and structure, unspecified site: Secondary | ICD-10-CM | POA: Diagnosis not present

## 2020-01-01 DIAGNOSIS — E559 Vitamin D deficiency, unspecified: Secondary | ICD-10-CM | POA: Diagnosis not present

## 2020-01-01 DIAGNOSIS — R7303 Prediabetes: Secondary | ICD-10-CM | POA: Diagnosis not present

## 2020-01-03 ENCOUNTER — Other Ambulatory Visit (HOSPITAL_COMMUNITY): Payer: Self-pay | Admitting: Family Medicine

## 2020-01-03 MED FILL — WEGOVY 1 MG/0.5ML SOAJ: 1 | 28 days supply | Qty: 2 | Fill #0

## 2020-01-07 MED FILL — WEGOVY 1.7 MG/0.75ML SOAJ: 1.7 | 28 days supply | Qty: 3 | Fill #0

## 2020-01-09 DIAGNOSIS — J3081 Allergic rhinitis due to animal (cat) (dog) hair and dander: Secondary | ICD-10-CM | POA: Diagnosis not present

## 2020-01-09 DIAGNOSIS — J3089 Other allergic rhinitis: Secondary | ICD-10-CM | POA: Diagnosis not present

## 2020-01-09 DIAGNOSIS — J301 Allergic rhinitis due to pollen: Secondary | ICD-10-CM | POA: Diagnosis not present

## 2020-01-14 DIAGNOSIS — J301 Allergic rhinitis due to pollen: Secondary | ICD-10-CM | POA: Diagnosis not present

## 2020-01-14 DIAGNOSIS — J3089 Other allergic rhinitis: Secondary | ICD-10-CM | POA: Diagnosis not present

## 2020-01-14 DIAGNOSIS — H1045 Other chronic allergic conjunctivitis: Secondary | ICD-10-CM | POA: Diagnosis not present

## 2020-01-14 DIAGNOSIS — J4520 Mild intermittent asthma, uncomplicated: Secondary | ICD-10-CM | POA: Diagnosis not present

## 2020-01-24 DIAGNOSIS — F32A Depression, unspecified: Secondary | ICD-10-CM | POA: Diagnosis not present

## 2020-01-25 ENCOUNTER — Other Ambulatory Visit (HOSPITAL_COMMUNITY): Payer: Self-pay | Admitting: Family Medicine

## 2020-01-28 ENCOUNTER — Other Ambulatory Visit (HOSPITAL_COMMUNITY): Payer: Self-pay | Admitting: Family Medicine

## 2020-01-31 MED FILL — WEGOVY 1.7 MG/0.75ML SOAJ: 1.7 | 28 days supply | Qty: 3 | Fill #0

## 2020-02-01 DIAGNOSIS — M21611 Bunion of right foot: Secondary | ICD-10-CM | POA: Diagnosis not present

## 2020-02-01 DIAGNOSIS — M19071 Primary osteoarthritis, right ankle and foot: Secondary | ICD-10-CM | POA: Diagnosis not present

## 2020-02-01 MED FILL — MONTELUKAST SOD 10 MG TAB: 10 | 30 days supply | Qty: 30 | Fill #0

## 2020-02-07 ENCOUNTER — Other Ambulatory Visit (HOSPITAL_COMMUNITY): Payer: Self-pay | Admitting: Family Medicine

## 2020-02-07 DIAGNOSIS — E785 Hyperlipidemia, unspecified: Secondary | ICD-10-CM | POA: Diagnosis not present

## 2020-02-07 DIAGNOSIS — F32A Depression, unspecified: Secondary | ICD-10-CM | POA: Diagnosis not present

## 2020-02-07 DIAGNOSIS — M8580 Other specified disorders of bone density and structure, unspecified site: Secondary | ICD-10-CM | POA: Diagnosis not present

## 2020-02-07 DIAGNOSIS — J3081 Allergic rhinitis due to animal (cat) (dog) hair and dander: Secondary | ICD-10-CM | POA: Diagnosis not present

## 2020-02-07 DIAGNOSIS — E669 Obesity, unspecified: Secondary | ICD-10-CM | POA: Diagnosis not present

## 2020-02-07 DIAGNOSIS — J3089 Other allergic rhinitis: Secondary | ICD-10-CM | POA: Diagnosis not present

## 2020-02-07 DIAGNOSIS — R7303 Prediabetes: Secondary | ICD-10-CM | POA: Diagnosis not present

## 2020-02-07 DIAGNOSIS — J301 Allergic rhinitis due to pollen: Secondary | ICD-10-CM | POA: Diagnosis not present

## 2020-02-07 MED FILL — TOPIRAMATE 25 MG TABLET: 25 | 30 days supply | Qty: 30 | Fill #0

## 2020-02-07 MED FILL — PHENTERMINE 15 MG CAPSULE: 15 | 30 days supply | Qty: 30 | Fill #0

## 2020-02-20 DIAGNOSIS — J301 Allergic rhinitis due to pollen: Secondary | ICD-10-CM | POA: Diagnosis not present

## 2020-02-20 DIAGNOSIS — J3081 Allergic rhinitis due to animal (cat) (dog) hair and dander: Secondary | ICD-10-CM | POA: Diagnosis not present

## 2020-02-20 DIAGNOSIS — J3089 Other allergic rhinitis: Secondary | ICD-10-CM | POA: Diagnosis not present

## 2020-02-21 DIAGNOSIS — F32A Depression, unspecified: Secondary | ICD-10-CM | POA: Diagnosis not present

## 2020-02-29 DIAGNOSIS — F32A Depression, unspecified: Secondary | ICD-10-CM | POA: Diagnosis not present

## 2020-03-05 MED FILL — buPROPion HCL ER (XL) 150 M: 150 | 90 days supply | Qty: 90 | Fill #0

## 2020-03-05 MED FILL — LEVOCETIRIZINE 5 MG TABLET: 5 | 90 days supply | Qty: 90 | Fill #3

## 2020-03-06 ENCOUNTER — Other Ambulatory Visit (HOSPITAL_COMMUNITY): Payer: Self-pay | Admitting: Family Medicine

## 2020-03-06 MED FILL — MONTELUKAST SOD 10 MG TAB: 10 | 30 days supply | Qty: 30 | Fill #0

## 2020-03-10 ENCOUNTER — Other Ambulatory Visit (HOSPITAL_COMMUNITY): Payer: Self-pay | Admitting: Family Medicine

## 2020-03-10 DIAGNOSIS — R7303 Prediabetes: Secondary | ICD-10-CM | POA: Diagnosis not present

## 2020-03-10 DIAGNOSIS — M8580 Other specified disorders of bone density and structure, unspecified site: Secondary | ICD-10-CM | POA: Diagnosis not present

## 2020-03-10 DIAGNOSIS — E785 Hyperlipidemia, unspecified: Secondary | ICD-10-CM | POA: Diagnosis not present

## 2020-03-10 DIAGNOSIS — E669 Obesity, unspecified: Secondary | ICD-10-CM | POA: Diagnosis not present

## 2020-03-10 MED FILL — TOPIRAMATE 50 MG TABLET: 50 | 30 days supply | Qty: 30 | Fill #0

## 2020-03-10 MED FILL — PHENTERMINE 37.5 MG TABLET: 37.5 | 30 days supply | Qty: 30 | Fill #0

## 2020-03-13 DIAGNOSIS — J301 Allergic rhinitis due to pollen: Secondary | ICD-10-CM | POA: Diagnosis not present

## 2020-03-13 DIAGNOSIS — J3081 Allergic rhinitis due to animal (cat) (dog) hair and dander: Secondary | ICD-10-CM | POA: Diagnosis not present

## 2020-03-13 DIAGNOSIS — J3089 Other allergic rhinitis: Secondary | ICD-10-CM | POA: Diagnosis not present

## 2020-03-20 DIAGNOSIS — F32A Depression, unspecified: Secondary | ICD-10-CM | POA: Diagnosis not present

## 2020-04-03 DIAGNOSIS — J3081 Allergic rhinitis due to animal (cat) (dog) hair and dander: Secondary | ICD-10-CM | POA: Diagnosis not present

## 2020-04-03 DIAGNOSIS — F32A Depression, unspecified: Secondary | ICD-10-CM | POA: Diagnosis not present

## 2020-04-03 DIAGNOSIS — J3089 Other allergic rhinitis: Secondary | ICD-10-CM | POA: Diagnosis not present

## 2020-04-03 DIAGNOSIS — J301 Allergic rhinitis due to pollen: Secondary | ICD-10-CM | POA: Diagnosis not present

## 2020-04-16 ENCOUNTER — Other Ambulatory Visit (HOSPITAL_COMMUNITY): Payer: Self-pay

## 2020-04-16 MED ORDER — TOPIRAMATE 50 MG PO TABS
50.0000 mg | ORAL_TABLET | Freq: Every day | ORAL | 0 refills | Status: DC
  Filled 2020-04-16: qty 30, 30d supply, fill #0

## 2020-04-16 MED ORDER — PHENTERMINE HCL 37.5 MG PO TABS
37.5000 mg | ORAL_TABLET | Freq: Every day | ORAL | 0 refills | Status: DC
  Filled 2020-04-16: qty 30, 30d supply, fill #0

## 2020-04-16 MED FILL — Omeprazole Cap Delayed Release 40 MG: ORAL | 90 days supply | Qty: 90 | Fill #0 | Status: AC

## 2020-04-17 ENCOUNTER — Other Ambulatory Visit (HOSPITAL_COMMUNITY): Payer: Self-pay

## 2020-04-17 DIAGNOSIS — F32A Depression, unspecified: Secondary | ICD-10-CM | POA: Diagnosis not present

## 2020-04-17 MED ORDER — MONTELUKAST SODIUM 10 MG PO TABS
10.0000 mg | ORAL_TABLET | Freq: Every day | ORAL | 6 refills | Status: DC
  Filled 2020-04-17: qty 30, 30d supply, fill #0
  Filled 2020-05-14: qty 30, 30d supply, fill #1
  Filled 2020-09-03 (×2): qty 90, 90d supply, fill #2

## 2020-04-17 MED ORDER — MONTELUKAST SODIUM 10 MG PO TABS
1.0000 | ORAL_TABLET | Freq: Every day | ORAL | 0 refills | Status: DC
  Filled 2020-04-17 – 2020-06-18 (×2): qty 30, 30d supply, fill #0

## 2020-04-25 ENCOUNTER — Other Ambulatory Visit (HOSPITAL_COMMUNITY): Payer: Self-pay

## 2020-05-01 DIAGNOSIS — F32A Depression, unspecified: Secondary | ICD-10-CM | POA: Diagnosis not present

## 2020-05-06 DIAGNOSIS — J3081 Allergic rhinitis due to animal (cat) (dog) hair and dander: Secondary | ICD-10-CM | POA: Diagnosis not present

## 2020-05-06 DIAGNOSIS — J3089 Other allergic rhinitis: Secondary | ICD-10-CM | POA: Diagnosis not present

## 2020-05-06 DIAGNOSIS — J301 Allergic rhinitis due to pollen: Secondary | ICD-10-CM | POA: Diagnosis not present

## 2020-05-14 ENCOUNTER — Other Ambulatory Visit (HOSPITAL_COMMUNITY): Payer: Self-pay

## 2020-05-15 ENCOUNTER — Other Ambulatory Visit (HOSPITAL_COMMUNITY): Payer: Self-pay

## 2020-05-15 DIAGNOSIS — F32A Depression, unspecified: Secondary | ICD-10-CM | POA: Diagnosis not present

## 2020-05-15 MED ORDER — TOPIRAMATE 50 MG PO TABS
50.0000 mg | ORAL_TABLET | Freq: Every day | ORAL | 0 refills | Status: DC
  Filled 2020-05-15: qty 30, 30d supply, fill #0

## 2020-05-19 ENCOUNTER — Other Ambulatory Visit (HOSPITAL_COMMUNITY): Payer: Self-pay

## 2020-05-20 DIAGNOSIS — J3089 Other allergic rhinitis: Secondary | ICD-10-CM | POA: Diagnosis not present

## 2020-05-20 DIAGNOSIS — J3081 Allergic rhinitis due to animal (cat) (dog) hair and dander: Secondary | ICD-10-CM | POA: Diagnosis not present

## 2020-05-20 DIAGNOSIS — J301 Allergic rhinitis due to pollen: Secondary | ICD-10-CM | POA: Diagnosis not present

## 2020-06-04 ENCOUNTER — Other Ambulatory Visit (HOSPITAL_COMMUNITY): Payer: Self-pay

## 2020-06-04 MED FILL — Bupropion HCl Tab ER 24HR 150 MG: ORAL | 90 days supply | Qty: 90 | Fill #0 | Status: AC

## 2020-06-06 ENCOUNTER — Other Ambulatory Visit (HOSPITAL_COMMUNITY): Payer: Self-pay

## 2020-06-06 MED ORDER — LEVOCETIRIZINE DIHYDROCHLORIDE 5 MG PO TABS
5.0000 mg | ORAL_TABLET | Freq: Every day | ORAL | 0 refills | Status: DC
  Filled 2020-06-06: qty 90, 90d supply, fill #0

## 2020-06-12 DIAGNOSIS — F32A Depression, unspecified: Secondary | ICD-10-CM | POA: Diagnosis not present

## 2020-06-12 DIAGNOSIS — J3081 Allergic rhinitis due to animal (cat) (dog) hair and dander: Secondary | ICD-10-CM | POA: Diagnosis not present

## 2020-06-12 DIAGNOSIS — J301 Allergic rhinitis due to pollen: Secondary | ICD-10-CM | POA: Diagnosis not present

## 2020-06-12 DIAGNOSIS — J3089 Other allergic rhinitis: Secondary | ICD-10-CM | POA: Diagnosis not present

## 2020-06-17 ENCOUNTER — Other Ambulatory Visit (HOSPITAL_COMMUNITY): Payer: Self-pay

## 2020-06-17 DIAGNOSIS — I129 Hypertensive chronic kidney disease with stage 1 through stage 4 chronic kidney disease, or unspecified chronic kidney disease: Secondary | ICD-10-CM | POA: Diagnosis not present

## 2020-06-17 DIAGNOSIS — E1143 Type 2 diabetes mellitus with diabetic autonomic (poly)neuropathy: Secondary | ICD-10-CM | POA: Diagnosis not present

## 2020-06-17 DIAGNOSIS — E1159 Type 2 diabetes mellitus with other circulatory complications: Secondary | ICD-10-CM | POA: Diagnosis not present

## 2020-06-17 DIAGNOSIS — I152 Hypertension secondary to endocrine disorders: Secondary | ICD-10-CM | POA: Diagnosis not present

## 2020-06-17 DIAGNOSIS — E1165 Type 2 diabetes mellitus with hyperglycemia: Secondary | ICD-10-CM | POA: Diagnosis not present

## 2020-06-17 DIAGNOSIS — E11319 Type 2 diabetes mellitus with unspecified diabetic retinopathy without macular edema: Secondary | ICD-10-CM | POA: Diagnosis not present

## 2020-06-17 DIAGNOSIS — E1122 Type 2 diabetes mellitus with diabetic chronic kidney disease: Secondary | ICD-10-CM | POA: Diagnosis not present

## 2020-06-17 DIAGNOSIS — E1121 Type 2 diabetes mellitus with diabetic nephropathy: Secondary | ICD-10-CM | POA: Diagnosis not present

## 2020-06-17 MED ORDER — PHENTERMINE HCL 37.5 MG PO TABS
37.5000 mg | ORAL_TABLET | Freq: Every day | ORAL | 0 refills | Status: DC
  Filled 2020-06-17: qty 30, 30d supply, fill #0

## 2020-06-17 MED ORDER — OZEMPIC (0.25 OR 0.5 MG/DOSE) 2 MG/1.5ML ~~LOC~~ SOPN
0.2500 mg | PEN_INJECTOR | SUBCUTANEOUS | 3 refills | Status: DC
  Filled 2020-06-17: qty 1.5, 56d supply, fill #0

## 2020-06-17 MED ORDER — TOPIRAMATE 25 MG PO TABS
25.0000 mg | ORAL_TABLET | Freq: Every day | ORAL | 0 refills | Status: DC
  Filled 2020-06-17: qty 7, 7d supply, fill #0

## 2020-06-18 ENCOUNTER — Other Ambulatory Visit (HOSPITAL_COMMUNITY): Payer: Self-pay

## 2020-06-27 DIAGNOSIS — J301 Allergic rhinitis due to pollen: Secondary | ICD-10-CM | POA: Diagnosis not present

## 2020-06-27 DIAGNOSIS — J3081 Allergic rhinitis due to animal (cat) (dog) hair and dander: Secondary | ICD-10-CM | POA: Diagnosis not present

## 2020-06-27 DIAGNOSIS — J3089 Other allergic rhinitis: Secondary | ICD-10-CM | POA: Diagnosis not present

## 2020-07-04 DIAGNOSIS — J301 Allergic rhinitis due to pollen: Secondary | ICD-10-CM | POA: Diagnosis not present

## 2020-07-07 DIAGNOSIS — J3089 Other allergic rhinitis: Secondary | ICD-10-CM | POA: Diagnosis not present

## 2020-07-07 DIAGNOSIS — J3081 Allergic rhinitis due to animal (cat) (dog) hair and dander: Secondary | ICD-10-CM | POA: Diagnosis not present

## 2020-07-08 ENCOUNTER — Other Ambulatory Visit (HOSPITAL_COMMUNITY): Payer: Self-pay

## 2020-07-08 MED ORDER — MONTELUKAST SODIUM 10 MG PO TABS
10.0000 mg | ORAL_TABLET | Freq: Every day | ORAL | 0 refills | Status: DC
  Filled 2020-07-08: qty 30, 30d supply, fill #0

## 2020-07-08 MED FILL — Omeprazole Cap Delayed Release 40 MG: ORAL | 90 days supply | Qty: 90 | Fill #1 | Status: AC

## 2020-07-09 ENCOUNTER — Other Ambulatory Visit (HOSPITAL_COMMUNITY): Payer: Self-pay

## 2020-07-15 ENCOUNTER — Other Ambulatory Visit (HOSPITAL_COMMUNITY): Payer: Self-pay

## 2020-07-16 ENCOUNTER — Other Ambulatory Visit (HOSPITAL_COMMUNITY): Payer: Self-pay

## 2020-07-17 DIAGNOSIS — J3081 Allergic rhinitis due to animal (cat) (dog) hair and dander: Secondary | ICD-10-CM | POA: Diagnosis not present

## 2020-07-17 DIAGNOSIS — J301 Allergic rhinitis due to pollen: Secondary | ICD-10-CM | POA: Diagnosis not present

## 2020-07-17 DIAGNOSIS — J3089 Other allergic rhinitis: Secondary | ICD-10-CM | POA: Diagnosis not present

## 2020-07-17 DIAGNOSIS — F32A Depression, unspecified: Secondary | ICD-10-CM | POA: Diagnosis not present

## 2020-07-21 ENCOUNTER — Other Ambulatory Visit (HOSPITAL_COMMUNITY): Payer: Self-pay

## 2020-07-21 MED ORDER — OZEMPIC (0.25 OR 0.5 MG/DOSE) 2 MG/1.5ML ~~LOC~~ SOPN
0.5000 mg | PEN_INJECTOR | SUBCUTANEOUS | 1 refills | Status: DC
  Filled 2020-07-21 (×2): qty 4.5, 84d supply, fill #0

## 2020-07-22 ENCOUNTER — Other Ambulatory Visit (HOSPITAL_COMMUNITY): Payer: Self-pay

## 2020-07-24 ENCOUNTER — Other Ambulatory Visit (HOSPITAL_COMMUNITY): Payer: Self-pay

## 2020-08-14 DIAGNOSIS — J301 Allergic rhinitis due to pollen: Secondary | ICD-10-CM | POA: Diagnosis not present

## 2020-08-14 DIAGNOSIS — J3089 Other allergic rhinitis: Secondary | ICD-10-CM | POA: Diagnosis not present

## 2020-08-14 DIAGNOSIS — J3081 Allergic rhinitis due to animal (cat) (dog) hair and dander: Secondary | ICD-10-CM | POA: Diagnosis not present

## 2020-08-14 DIAGNOSIS — F32A Depression, unspecified: Secondary | ICD-10-CM | POA: Diagnosis not present

## 2020-08-18 ENCOUNTER — Other Ambulatory Visit (HOSPITAL_COMMUNITY): Payer: Self-pay

## 2020-08-19 ENCOUNTER — Other Ambulatory Visit (HOSPITAL_COMMUNITY): Payer: Self-pay

## 2020-08-19 DIAGNOSIS — J3089 Other allergic rhinitis: Secondary | ICD-10-CM | POA: Diagnosis not present

## 2020-08-19 DIAGNOSIS — J301 Allergic rhinitis due to pollen: Secondary | ICD-10-CM | POA: Diagnosis not present

## 2020-08-19 DIAGNOSIS — J3081 Allergic rhinitis due to animal (cat) (dog) hair and dander: Secondary | ICD-10-CM | POA: Diagnosis not present

## 2020-08-19 MED ORDER — MONTELUKAST SODIUM 10 MG PO TABS
10.0000 mg | ORAL_TABLET | Freq: Every day | ORAL | 0 refills | Status: DC
  Filled 2020-08-19: qty 30, 30d supply, fill #0

## 2020-08-20 DIAGNOSIS — D225 Melanocytic nevi of trunk: Secondary | ICD-10-CM | POA: Diagnosis not present

## 2020-08-20 DIAGNOSIS — Z85828 Personal history of other malignant neoplasm of skin: Secondary | ICD-10-CM | POA: Diagnosis not present

## 2020-08-20 DIAGNOSIS — L918 Other hypertrophic disorders of the skin: Secondary | ICD-10-CM | POA: Diagnosis not present

## 2020-08-20 DIAGNOSIS — D1801 Hemangioma of skin and subcutaneous tissue: Secondary | ICD-10-CM | POA: Diagnosis not present

## 2020-08-20 DIAGNOSIS — L570 Actinic keratosis: Secondary | ICD-10-CM | POA: Diagnosis not present

## 2020-08-20 DIAGNOSIS — L738 Other specified follicular disorders: Secondary | ICD-10-CM | POA: Diagnosis not present

## 2020-08-28 DIAGNOSIS — J3081 Allergic rhinitis due to animal (cat) (dog) hair and dander: Secondary | ICD-10-CM | POA: Diagnosis not present

## 2020-08-28 DIAGNOSIS — J3089 Other allergic rhinitis: Secondary | ICD-10-CM | POA: Diagnosis not present

## 2020-08-28 DIAGNOSIS — J301 Allergic rhinitis due to pollen: Secondary | ICD-10-CM | POA: Diagnosis not present

## 2020-09-02 ENCOUNTER — Other Ambulatory Visit (HOSPITAL_COMMUNITY): Payer: Self-pay

## 2020-09-03 ENCOUNTER — Other Ambulatory Visit (HOSPITAL_COMMUNITY): Payer: Self-pay

## 2020-09-03 DIAGNOSIS — J301 Allergic rhinitis due to pollen: Secondary | ICD-10-CM | POA: Diagnosis not present

## 2020-09-03 DIAGNOSIS — J3081 Allergic rhinitis due to animal (cat) (dog) hair and dander: Secondary | ICD-10-CM | POA: Diagnosis not present

## 2020-09-03 DIAGNOSIS — J3089 Other allergic rhinitis: Secondary | ICD-10-CM | POA: Diagnosis not present

## 2020-09-03 MED ORDER — MONTELUKAST SODIUM 10 MG PO TABS
10.0000 mg | ORAL_TABLET | Freq: Every day | ORAL | 5 refills | Status: DC
  Filled 2020-09-03: qty 90, 90d supply, fill #0

## 2020-09-03 MED FILL — Bupropion HCl Tab ER 24HR 150 MG: ORAL | 90 days supply | Qty: 90 | Fill #1 | Status: AC

## 2020-09-04 ENCOUNTER — Other Ambulatory Visit (HOSPITAL_COMMUNITY): Payer: Self-pay

## 2020-09-05 ENCOUNTER — Other Ambulatory Visit (HOSPITAL_COMMUNITY): Payer: Self-pay

## 2020-09-05 MED ORDER — LEVOCETIRIZINE DIHYDROCHLORIDE 5 MG PO TABS
5.0000 mg | ORAL_TABLET | Freq: Every day | ORAL | 0 refills | Status: DC
  Filled 2020-09-05: qty 90, 90d supply, fill #0

## 2020-09-18 ENCOUNTER — Other Ambulatory Visit (HOSPITAL_COMMUNITY): Payer: Self-pay

## 2020-09-18 DIAGNOSIS — E785 Hyperlipidemia, unspecified: Secondary | ICD-10-CM | POA: Diagnosis not present

## 2020-09-18 DIAGNOSIS — J309 Allergic rhinitis, unspecified: Secondary | ICD-10-CM | POA: Diagnosis not present

## 2020-09-18 DIAGNOSIS — G4733 Obstructive sleep apnea (adult) (pediatric): Secondary | ICD-10-CM | POA: Diagnosis not present

## 2020-09-18 DIAGNOSIS — K219 Gastro-esophageal reflux disease without esophagitis: Secondary | ICD-10-CM | POA: Diagnosis not present

## 2020-09-18 DIAGNOSIS — E663 Overweight: Secondary | ICD-10-CM | POA: Diagnosis not present

## 2020-09-18 DIAGNOSIS — F32A Depression, unspecified: Secondary | ICD-10-CM | POA: Diagnosis not present

## 2020-09-18 MED ORDER — OMEPRAZOLE 20 MG PO CPDR
20.0000 mg | DELAYED_RELEASE_CAPSULE | Freq: Every morning | ORAL | 1 refills | Status: DC
  Filled 2020-09-18: qty 90, 90d supply, fill #0

## 2020-09-18 MED ORDER — BUPROPION HCL ER (XL) 150 MG PO TB24
150.0000 mg | ORAL_TABLET | Freq: Every morning | ORAL | 3 refills | Status: DC
  Filled 2021-03-09: qty 90, 90d supply, fill #0
  Filled 2021-05-29: qty 90, 90d supply, fill #1

## 2020-09-18 MED ORDER — OZEMPIC (0.25 OR 0.5 MG/DOSE) 2 MG/1.5ML ~~LOC~~ SOPN
0.5000 mg | PEN_INJECTOR | SUBCUTANEOUS | 5 refills | Status: DC
  Filled 2020-09-18 – 2020-11-11 (×2): qty 1.5, 28d supply, fill #0
  Filled 2020-12-17: qty 1.5, 28d supply, fill #1

## 2020-09-18 MED ORDER — LEVOCETIRIZINE DIHYDROCHLORIDE 5 MG PO TABS
5.0000 mg | ORAL_TABLET | Freq: Every evening | ORAL | 3 refills | Status: DC
  Filled 2020-12-02: qty 90, 90d supply, fill #0
  Filled 2021-03-09: qty 90, 90d supply, fill #1
  Filled 2021-05-29: qty 90, 90d supply, fill #2
  Filled 2021-09-01: qty 90, 90d supply, fill #3

## 2020-09-18 MED ORDER — MONTELUKAST SODIUM 10 MG PO TABS
10.0000 mg | ORAL_TABLET | Freq: Every day | ORAL | 3 refills | Status: DC
  Filled 2020-09-18: qty 90, 90d supply, fill #0
  Filled 2020-12-16: qty 90, 90d supply, fill #1

## 2020-09-19 DIAGNOSIS — Z0000 Encounter for general adult medical examination without abnormal findings: Secondary | ICD-10-CM | POA: Diagnosis not present

## 2020-09-24 DIAGNOSIS — J3089 Other allergic rhinitis: Secondary | ICD-10-CM | POA: Diagnosis not present

## 2020-09-24 DIAGNOSIS — J3081 Allergic rhinitis due to animal (cat) (dog) hair and dander: Secondary | ICD-10-CM | POA: Diagnosis not present

## 2020-09-24 DIAGNOSIS — J301 Allergic rhinitis due to pollen: Secondary | ICD-10-CM | POA: Diagnosis not present

## 2020-10-02 DIAGNOSIS — Z23 Encounter for immunization: Secondary | ICD-10-CM | POA: Diagnosis not present

## 2020-10-02 DIAGNOSIS — Z0000 Encounter for general adult medical examination without abnormal findings: Secondary | ICD-10-CM | POA: Diagnosis not present

## 2020-10-02 DIAGNOSIS — J3081 Allergic rhinitis due to animal (cat) (dog) hair and dander: Secondary | ICD-10-CM | POA: Diagnosis not present

## 2020-10-02 DIAGNOSIS — J3089 Other allergic rhinitis: Secondary | ICD-10-CM | POA: Diagnosis not present

## 2020-10-02 DIAGNOSIS — J301 Allergic rhinitis due to pollen: Secondary | ICD-10-CM | POA: Diagnosis not present

## 2020-10-08 DIAGNOSIS — J3089 Other allergic rhinitis: Secondary | ICD-10-CM | POA: Diagnosis not present

## 2020-10-08 DIAGNOSIS — J301 Allergic rhinitis due to pollen: Secondary | ICD-10-CM | POA: Diagnosis not present

## 2020-10-08 DIAGNOSIS — J3081 Allergic rhinitis due to animal (cat) (dog) hair and dander: Secondary | ICD-10-CM | POA: Diagnosis not present

## 2020-10-14 DIAGNOSIS — J3081 Allergic rhinitis due to animal (cat) (dog) hair and dander: Secondary | ICD-10-CM | POA: Diagnosis not present

## 2020-10-14 DIAGNOSIS — J301 Allergic rhinitis due to pollen: Secondary | ICD-10-CM | POA: Diagnosis not present

## 2020-10-14 DIAGNOSIS — J3089 Other allergic rhinitis: Secondary | ICD-10-CM | POA: Diagnosis not present

## 2020-10-16 DIAGNOSIS — Z1231 Encounter for screening mammogram for malignant neoplasm of breast: Secondary | ICD-10-CM | POA: Diagnosis not present

## 2020-10-16 DIAGNOSIS — Z01419 Encounter for gynecological examination (general) (routine) without abnormal findings: Secondary | ICD-10-CM | POA: Diagnosis not present

## 2020-10-16 DIAGNOSIS — Z6828 Body mass index (BMI) 28.0-28.9, adult: Secondary | ICD-10-CM | POA: Diagnosis not present

## 2020-10-16 DIAGNOSIS — F32A Depression, unspecified: Secondary | ICD-10-CM | POA: Diagnosis not present

## 2020-10-23 DIAGNOSIS — J301 Allergic rhinitis due to pollen: Secondary | ICD-10-CM | POA: Diagnosis not present

## 2020-10-23 DIAGNOSIS — J3081 Allergic rhinitis due to animal (cat) (dog) hair and dander: Secondary | ICD-10-CM | POA: Diagnosis not present

## 2020-10-23 DIAGNOSIS — J3089 Other allergic rhinitis: Secondary | ICD-10-CM | POA: Diagnosis not present

## 2020-10-23 DIAGNOSIS — Z23 Encounter for immunization: Secondary | ICD-10-CM | POA: Diagnosis not present

## 2020-10-30 DIAGNOSIS — J301 Allergic rhinitis due to pollen: Secondary | ICD-10-CM | POA: Diagnosis not present

## 2020-10-30 DIAGNOSIS — J3081 Allergic rhinitis due to animal (cat) (dog) hair and dander: Secondary | ICD-10-CM | POA: Diagnosis not present

## 2020-10-30 DIAGNOSIS — J3089 Other allergic rhinitis: Secondary | ICD-10-CM | POA: Diagnosis not present

## 2020-11-06 ENCOUNTER — Other Ambulatory Visit (HOSPITAL_COMMUNITY): Payer: Self-pay

## 2020-11-06 DIAGNOSIS — K449 Diaphragmatic hernia without obstruction or gangrene: Secondary | ICD-10-CM | POA: Diagnosis not present

## 2020-11-06 DIAGNOSIS — E663 Overweight: Secondary | ICD-10-CM | POA: Diagnosis not present

## 2020-11-06 DIAGNOSIS — K219 Gastro-esophageal reflux disease without esophagitis: Secondary | ICD-10-CM | POA: Diagnosis not present

## 2020-11-06 DIAGNOSIS — F411 Generalized anxiety disorder: Secondary | ICD-10-CM | POA: Diagnosis not present

## 2020-11-06 MED ORDER — DEXLANSOPRAZOLE 60 MG PO CPDR
60.0000 mg | DELAYED_RELEASE_CAPSULE | Freq: Every day | ORAL | 0 refills | Status: DC
  Filled 2020-11-06: qty 90, 90d supply, fill #0

## 2020-11-07 ENCOUNTER — Other Ambulatory Visit: Payer: Self-pay | Admitting: Family Medicine

## 2020-11-07 DIAGNOSIS — K449 Diaphragmatic hernia without obstruction or gangrene: Secondary | ICD-10-CM

## 2020-11-11 ENCOUNTER — Other Ambulatory Visit (HOSPITAL_COMMUNITY): Payer: Self-pay

## 2020-11-11 ENCOUNTER — Ambulatory Visit
Admission: RE | Admit: 2020-11-11 | Discharge: 2020-11-11 | Disposition: A | Discharge status: Home / Self Care | Payer: 59 | Source: Ambulatory Visit | Attending: Family Medicine | Admitting: Family Medicine

## 2020-11-11 DIAGNOSIS — K449 Diaphragmatic hernia without obstruction or gangrene: Secondary | ICD-10-CM

## 2020-11-11 DIAGNOSIS — R1310 Dysphagia, unspecified: Secondary | ICD-10-CM | POA: Diagnosis not present

## 2020-11-11 DIAGNOSIS — K224 Dyskinesia of esophagus: Secondary | ICD-10-CM | POA: Diagnosis not present

## 2020-11-12 DIAGNOSIS — K9509 Other complications of gastric band procedure: Secondary | ICD-10-CM | POA: Diagnosis not present

## 2020-11-12 DIAGNOSIS — Z4651 Encounter for fitting and adjustment of gastric lap band: Secondary | ICD-10-CM | POA: Diagnosis not present

## 2020-11-13 DIAGNOSIS — F32A Depression, unspecified: Secondary | ICD-10-CM | POA: Diagnosis not present

## 2020-11-18 DIAGNOSIS — K224 Dyskinesia of esophagus: Secondary | ICD-10-CM | POA: Diagnosis not present

## 2020-11-18 DIAGNOSIS — K219 Gastro-esophageal reflux disease without esophagitis: Secondary | ICD-10-CM | POA: Diagnosis not present

## 2020-11-18 DIAGNOSIS — Z9884 Bariatric surgery status: Secondary | ICD-10-CM | POA: Diagnosis not present

## 2020-11-24 ENCOUNTER — Other Ambulatory Visit (HOSPITAL_COMMUNITY): Payer: Self-pay

## 2020-11-24 DIAGNOSIS — K219 Gastro-esophageal reflux disease without esophagitis: Secondary | ICD-10-CM | POA: Diagnosis not present

## 2020-11-24 DIAGNOSIS — J40 Bronchitis, not specified as acute or chronic: Secondary | ICD-10-CM | POA: Diagnosis not present

## 2020-11-24 DIAGNOSIS — J329 Chronic sinusitis, unspecified: Secondary | ICD-10-CM | POA: Diagnosis not present

## 2020-11-24 DIAGNOSIS — B9689 Other specified bacterial agents as the cause of diseases classified elsewhere: Secondary | ICD-10-CM | POA: Diagnosis not present

## 2020-11-24 DIAGNOSIS — K224 Dyskinesia of esophagus: Secondary | ICD-10-CM | POA: Diagnosis not present

## 2020-11-24 DIAGNOSIS — J069 Acute upper respiratory infection, unspecified: Secondary | ICD-10-CM | POA: Diagnosis not present

## 2020-11-24 MED ORDER — ALBUTEROL SULFATE HFA 108 (90 BASE) MCG/ACT IN AERS
2.0000 | INHALATION_SPRAY | RESPIRATORY_TRACT | 1 refills | Status: DC | PRN
  Filled 2020-11-24: qty 18, 17d supply, fill #0
  Filled 2021-11-18: qty 18, 17d supply, fill #1

## 2020-11-24 MED ORDER — BENZONATATE 200 MG PO CAPS
200.0000 mg | ORAL_CAPSULE | Freq: Three times a day (TID) | ORAL | 0 refills | Status: DC
  Filled 2020-11-24: qty 60, 20d supply, fill #0

## 2020-11-24 MED ORDER — BUDESONIDE-FORMOTEROL FUMARATE 160-4.5 MCG/ACT IN AERO
2.0000 | INHALATION_SPRAY | Freq: Two times a day (BID) | RESPIRATORY_TRACT | 0 refills | Status: DC
  Filled 2020-11-24: qty 10.2, 30d supply, fill #0

## 2020-11-24 MED ORDER — FAMOTIDINE 40 MG PO TABS
40.0000 mg | ORAL_TABLET | Freq: Every day | ORAL | 3 refills | Status: DC | PRN
  Filled 2020-11-24: qty 90, 90d supply, fill #0
  Filled 2021-03-09: qty 90, 90d supply, fill #1
  Filled 2021-05-28: qty 90, 90d supply, fill #2
  Filled 2021-09-01: qty 90, 90d supply, fill #3

## 2020-12-02 ENCOUNTER — Other Ambulatory Visit (HOSPITAL_COMMUNITY): Payer: Self-pay

## 2020-12-03 ENCOUNTER — Other Ambulatory Visit (HOSPITAL_COMMUNITY): Payer: Self-pay

## 2020-12-03 MED FILL — Bupropion HCl Tab ER 24HR 150 MG: ORAL | 90 days supply | Qty: 90 | Fill #2 | Status: AC

## 2020-12-11 DIAGNOSIS — J3081 Allergic rhinitis due to animal (cat) (dog) hair and dander: Secondary | ICD-10-CM | POA: Diagnosis not present

## 2020-12-11 DIAGNOSIS — J301 Allergic rhinitis due to pollen: Secondary | ICD-10-CM | POA: Diagnosis not present

## 2020-12-11 DIAGNOSIS — F32A Depression, unspecified: Secondary | ICD-10-CM | POA: Diagnosis not present

## 2020-12-11 DIAGNOSIS — J3089 Other allergic rhinitis: Secondary | ICD-10-CM | POA: Diagnosis not present

## 2020-12-16 ENCOUNTER — Other Ambulatory Visit (HOSPITAL_COMMUNITY): Payer: Self-pay

## 2020-12-16 DIAGNOSIS — J3089 Other allergic rhinitis: Secondary | ICD-10-CM | POA: Diagnosis not present

## 2020-12-16 DIAGNOSIS — J3081 Allergic rhinitis due to animal (cat) (dog) hair and dander: Secondary | ICD-10-CM | POA: Diagnosis not present

## 2020-12-16 DIAGNOSIS — J301 Allergic rhinitis due to pollen: Secondary | ICD-10-CM | POA: Diagnosis not present

## 2020-12-17 ENCOUNTER — Other Ambulatory Visit (HOSPITAL_COMMUNITY): Payer: Self-pay

## 2020-12-31 DIAGNOSIS — J3089 Other allergic rhinitis: Secondary | ICD-10-CM | POA: Diagnosis not present

## 2020-12-31 DIAGNOSIS — J301 Allergic rhinitis due to pollen: Secondary | ICD-10-CM | POA: Diagnosis not present

## 2020-12-31 DIAGNOSIS — J3081 Allergic rhinitis due to animal (cat) (dog) hair and dander: Secondary | ICD-10-CM | POA: Diagnosis not present

## 2021-01-08 DIAGNOSIS — H52222 Regular astigmatism, left eye: Secondary | ICD-10-CM | POA: Diagnosis not present

## 2021-01-08 DIAGNOSIS — H1045 Other chronic allergic conjunctivitis: Secondary | ICD-10-CM | POA: Diagnosis not present

## 2021-01-08 DIAGNOSIS — H5213 Myopia, bilateral: Secondary | ICD-10-CM | POA: Diagnosis not present

## 2021-01-08 DIAGNOSIS — H524 Presbyopia: Secondary | ICD-10-CM | POA: Diagnosis not present

## 2021-01-13 ENCOUNTER — Other Ambulatory Visit (HOSPITAL_COMMUNITY): Payer: Self-pay

## 2021-01-13 DIAGNOSIS — R7303 Prediabetes: Secondary | ICD-10-CM | POA: Diagnosis not present

## 2021-01-13 DIAGNOSIS — J3089 Other allergic rhinitis: Secondary | ICD-10-CM | POA: Diagnosis not present

## 2021-01-13 DIAGNOSIS — K224 Dyskinesia of esophagus: Secondary | ICD-10-CM | POA: Diagnosis not present

## 2021-01-13 DIAGNOSIS — J3081 Allergic rhinitis due to animal (cat) (dog) hair and dander: Secondary | ICD-10-CM | POA: Diagnosis not present

## 2021-01-13 DIAGNOSIS — J301 Allergic rhinitis due to pollen: Secondary | ICD-10-CM | POA: Diagnosis not present

## 2021-01-13 DIAGNOSIS — H1045 Other chronic allergic conjunctivitis: Secondary | ICD-10-CM | POA: Diagnosis not present

## 2021-01-13 DIAGNOSIS — J45909 Unspecified asthma, uncomplicated: Secondary | ICD-10-CM | POA: Diagnosis not present

## 2021-01-13 DIAGNOSIS — K219 Gastro-esophageal reflux disease without esophagitis: Secondary | ICD-10-CM | POA: Diagnosis not present

## 2021-01-13 DIAGNOSIS — J4520 Mild intermittent asthma, uncomplicated: Secondary | ICD-10-CM | POA: Diagnosis not present

## 2021-01-13 MED ORDER — DESLORATADINE 5 MG PO TABS
5.0000 mg | ORAL_TABLET | Freq: Every day | ORAL | 6 refills | Status: DC
  Filled 2021-01-13: qty 30, 30d supply, fill #0

## 2021-01-13 MED ORDER — DEXLANSOPRAZOLE 60 MG PO CPDR
60.0000 mg | DELAYED_RELEASE_CAPSULE | Freq: Every day | ORAL | 3 refills | Status: DC
  Filled 2021-01-13 – 2021-02-11 (×2): qty 90, 90d supply, fill #0

## 2021-01-13 MED ORDER — EPINEPHRINE 0.3 MG/0.3ML IJ SOAJ
0.3000 mg | Freq: Every day | INTRAMUSCULAR | 1 refills | Status: DC | PRN
  Filled 2021-01-13: qty 2, 2d supply, fill #0

## 2021-01-13 MED ORDER — OZEMPIC (0.25 OR 0.5 MG/DOSE) 2 MG/1.5ML ~~LOC~~ SOPN
0.5000 mg | PEN_INJECTOR | SUBCUTANEOUS | 5 refills | Status: DC
Start: 2021-01-13 — End: 2021-04-07
  Filled 2021-01-13: qty 1.5, 28d supply, fill #0
  Filled 2021-02-11: qty 1.5, 28d supply, fill #1

## 2021-01-13 MED ORDER — MONTELUKAST SODIUM 10 MG PO TABS
10.0000 mg | ORAL_TABLET | Freq: Every evening | ORAL | 6 refills | Status: DC
  Filled 2021-01-13: qty 30, 30d supply, fill #0
  Filled 2021-03-09: qty 90, 90d supply, fill #0
  Filled 2021-06-24: qty 90, 90d supply, fill #1
  Filled 2021-09-24: qty 90, 90d supply, fill #2

## 2021-01-13 MED ORDER — MOMETASONE FUROATE 50 MCG/ACT NA SUSP
1.0000 | Freq: Every day | NASAL | 3 refills | Status: AC
  Filled 2021-01-13 – 2021-12-02 (×2): qty 17, 30d supply, fill #0

## 2021-01-14 ENCOUNTER — Other Ambulatory Visit (HOSPITAL_COMMUNITY): Payer: Self-pay

## 2021-01-22 DIAGNOSIS — J3089 Other allergic rhinitis: Secondary | ICD-10-CM | POA: Diagnosis not present

## 2021-01-22 DIAGNOSIS — J301 Allergic rhinitis due to pollen: Secondary | ICD-10-CM | POA: Diagnosis not present

## 2021-01-22 DIAGNOSIS — J3081 Allergic rhinitis due to animal (cat) (dog) hair and dander: Secondary | ICD-10-CM | POA: Diagnosis not present

## 2021-02-06 DIAGNOSIS — J3081 Allergic rhinitis due to animal (cat) (dog) hair and dander: Secondary | ICD-10-CM | POA: Diagnosis not present

## 2021-02-06 DIAGNOSIS — J3089 Other allergic rhinitis: Secondary | ICD-10-CM | POA: Diagnosis not present

## 2021-02-06 DIAGNOSIS — J301 Allergic rhinitis due to pollen: Secondary | ICD-10-CM | POA: Diagnosis not present

## 2021-02-11 ENCOUNTER — Other Ambulatory Visit (HOSPITAL_COMMUNITY): Payer: Self-pay

## 2021-02-20 DIAGNOSIS — J3081 Allergic rhinitis due to animal (cat) (dog) hair and dander: Secondary | ICD-10-CM | POA: Diagnosis not present

## 2021-02-20 DIAGNOSIS — F32A Depression, unspecified: Secondary | ICD-10-CM | POA: Diagnosis not present

## 2021-02-20 DIAGNOSIS — J3089 Other allergic rhinitis: Secondary | ICD-10-CM | POA: Diagnosis not present

## 2021-02-20 DIAGNOSIS — J301 Allergic rhinitis due to pollen: Secondary | ICD-10-CM | POA: Diagnosis not present

## 2021-03-02 DIAGNOSIS — J3089 Other allergic rhinitis: Secondary | ICD-10-CM | POA: Diagnosis not present

## 2021-03-02 DIAGNOSIS — J301 Allergic rhinitis due to pollen: Secondary | ICD-10-CM | POA: Diagnosis not present

## 2021-03-02 DIAGNOSIS — J3081 Allergic rhinitis due to animal (cat) (dog) hair and dander: Secondary | ICD-10-CM | POA: Diagnosis not present

## 2021-03-04 ENCOUNTER — Other Ambulatory Visit (HOSPITAL_COMMUNITY): Payer: Self-pay

## 2021-03-04 DIAGNOSIS — R7303 Prediabetes: Secondary | ICD-10-CM | POA: Diagnosis not present

## 2021-03-04 DIAGNOSIS — E785 Hyperlipidemia, unspecified: Secondary | ICD-10-CM | POA: Diagnosis not present

## 2021-03-04 DIAGNOSIS — K219 Gastro-esophageal reflux disease without esophagitis: Secondary | ICD-10-CM | POA: Diagnosis not present

## 2021-03-04 MED ORDER — RYBELSUS 3 MG PO TABS
3.0000 mg | ORAL_TABLET | Freq: Every day | ORAL | 3 refills | Status: DC
  Filled 2021-03-04: qty 30, 30d supply, fill #0

## 2021-03-09 ENCOUNTER — Other Ambulatory Visit (HOSPITAL_COMMUNITY): Payer: Self-pay

## 2021-03-12 DIAGNOSIS — J3089 Other allergic rhinitis: Secondary | ICD-10-CM | POA: Diagnosis not present

## 2021-03-12 DIAGNOSIS — F32A Depression, unspecified: Secondary | ICD-10-CM | POA: Diagnosis not present

## 2021-03-12 DIAGNOSIS — J3081 Allergic rhinitis due to animal (cat) (dog) hair and dander: Secondary | ICD-10-CM | POA: Diagnosis not present

## 2021-03-12 DIAGNOSIS — J301 Allergic rhinitis due to pollen: Secondary | ICD-10-CM | POA: Diagnosis not present

## 2021-03-24 ENCOUNTER — Other Ambulatory Visit (HOSPITAL_COMMUNITY): Payer: Self-pay

## 2021-03-24 MED ORDER — BUPROPION HCL ER (XL) 150 MG PO TB24
150.0000 mg | ORAL_TABLET | Freq: Every day | ORAL | 3 refills | Status: DC
  Filled 2021-03-24 – 2021-10-02 (×2): qty 90, 90d supply, fill #0

## 2021-04-03 DIAGNOSIS — J3081 Allergic rhinitis due to animal (cat) (dog) hair and dander: Secondary | ICD-10-CM | POA: Diagnosis not present

## 2021-04-03 DIAGNOSIS — J3089 Other allergic rhinitis: Secondary | ICD-10-CM | POA: Diagnosis not present

## 2021-04-03 DIAGNOSIS — J301 Allergic rhinitis due to pollen: Secondary | ICD-10-CM | POA: Diagnosis not present

## 2021-04-07 ENCOUNTER — Other Ambulatory Visit (HOSPITAL_COMMUNITY): Payer: Self-pay

## 2021-04-07 DIAGNOSIS — E663 Overweight: Secondary | ICD-10-CM | POA: Diagnosis not present

## 2021-04-07 DIAGNOSIS — R7303 Prediabetes: Secondary | ICD-10-CM | POA: Diagnosis not present

## 2021-04-07 DIAGNOSIS — K219 Gastro-esophageal reflux disease without esophagitis: Secondary | ICD-10-CM | POA: Diagnosis not present

## 2021-04-07 MED ORDER — RYBELSUS 7 MG PO TABS
7.0000 mg | ORAL_TABLET | Freq: Every day | ORAL | 4 refills | Status: DC
Start: 2021-04-07 — End: 2021-10-12
  Filled 2021-04-07: qty 30, 30d supply, fill #0
  Filled 2021-08-06 – 2021-08-10 (×2): qty 30, 30d supply, fill #1
  Filled 2021-10-02: qty 90, 90d supply, fill #1

## 2021-04-07 MED ORDER — OMEPRAZOLE 40 MG PO CPDR
40.0000 mg | DELAYED_RELEASE_CAPSULE | Freq: Every day | ORAL | 1 refills | Status: DC
  Filled 2021-04-07: qty 145, 73d supply, fill #0
  Filled 2021-09-01: qty 145, 73d supply, fill #1

## 2021-04-16 DIAGNOSIS — F32A Depression, unspecified: Secondary | ICD-10-CM | POA: Diagnosis not present

## 2021-04-22 DIAGNOSIS — J301 Allergic rhinitis due to pollen: Secondary | ICD-10-CM | POA: Diagnosis not present

## 2021-04-22 DIAGNOSIS — J3089 Other allergic rhinitis: Secondary | ICD-10-CM | POA: Diagnosis not present

## 2021-04-22 DIAGNOSIS — J3081 Allergic rhinitis due to animal (cat) (dog) hair and dander: Secondary | ICD-10-CM | POA: Diagnosis not present

## 2021-05-12 DIAGNOSIS — J3089 Other allergic rhinitis: Secondary | ICD-10-CM | POA: Diagnosis not present

## 2021-05-12 DIAGNOSIS — J3081 Allergic rhinitis due to animal (cat) (dog) hair and dander: Secondary | ICD-10-CM | POA: Diagnosis not present

## 2021-05-12 DIAGNOSIS — J301 Allergic rhinitis due to pollen: Secondary | ICD-10-CM | POA: Diagnosis not present

## 2021-05-13 ENCOUNTER — Other Ambulatory Visit (HOSPITAL_COMMUNITY): Payer: Self-pay

## 2021-05-13 MED ORDER — RYBELSUS 7 MG PO TABS
1.0000 | ORAL_TABLET | Freq: Every day | ORAL | 0 refills | Status: DC
  Filled 2021-05-13: qty 90, 90d supply, fill #0

## 2021-05-14 DIAGNOSIS — F32A Depression, unspecified: Secondary | ICD-10-CM | POA: Diagnosis not present

## 2021-05-27 ENCOUNTER — Other Ambulatory Visit (HOSPITAL_COMMUNITY): Payer: Self-pay

## 2021-05-27 DIAGNOSIS — R7303 Prediabetes: Secondary | ICD-10-CM | POA: Diagnosis not present

## 2021-05-27 DIAGNOSIS — F900 Attention-deficit hyperactivity disorder, predominantly inattentive type: Secondary | ICD-10-CM | POA: Diagnosis not present

## 2021-05-27 DIAGNOSIS — F411 Generalized anxiety disorder: Secondary | ICD-10-CM | POA: Diagnosis not present

## 2021-05-27 DIAGNOSIS — G4700 Insomnia, unspecified: Secondary | ICD-10-CM | POA: Diagnosis not present

## 2021-05-27 DIAGNOSIS — K219 Gastro-esophageal reflux disease without esophagitis: Secondary | ICD-10-CM | POA: Diagnosis not present

## 2021-05-27 DIAGNOSIS — F331 Major depressive disorder, recurrent, moderate: Secondary | ICD-10-CM | POA: Diagnosis not present

## 2021-05-27 MED ORDER — SERTRALINE HCL 25 MG PO TABS
ORAL_TABLET | ORAL | 3 refills | Status: DC
  Filled 2021-05-27: qty 60, 30d supply, fill #0
  Filled 2021-06-24: qty 60, 30d supply, fill #1
  Filled 2021-08-06: qty 60, 30d supply, fill #2
  Filled 2021-09-04: qty 60, 30d supply, fill #3

## 2021-05-27 MED ORDER — WEGOVY 0.5 MG/0.5ML ~~LOC~~ SOAJ
0.5000 mg | SUBCUTANEOUS | 4 refills | Status: DC
  Filled 2021-05-27 – 2021-06-11 (×4): qty 2, 28d supply, fill #0

## 2021-05-28 ENCOUNTER — Other Ambulatory Visit (HOSPITAL_COMMUNITY): Payer: Self-pay

## 2021-05-29 ENCOUNTER — Other Ambulatory Visit (HOSPITAL_COMMUNITY): Payer: Self-pay

## 2021-05-29 MED ORDER — WEGOVY 1 MG/0.5ML ~~LOC~~ SOAJ
1.0000 mg | SUBCUTANEOUS | 2 refills | Status: DC
  Filled 2021-05-29 – 2021-06-11 (×5): qty 2, 28d supply, fill #0

## 2021-06-01 ENCOUNTER — Other Ambulatory Visit (HOSPITAL_COMMUNITY): Payer: Self-pay

## 2021-06-03 ENCOUNTER — Other Ambulatory Visit (HOSPITAL_COMMUNITY): Payer: Self-pay

## 2021-06-05 DIAGNOSIS — J301 Allergic rhinitis due to pollen: Secondary | ICD-10-CM | POA: Diagnosis not present

## 2021-06-05 DIAGNOSIS — J3081 Allergic rhinitis due to animal (cat) (dog) hair and dander: Secondary | ICD-10-CM | POA: Diagnosis not present

## 2021-06-05 DIAGNOSIS — J3089 Other allergic rhinitis: Secondary | ICD-10-CM | POA: Diagnosis not present

## 2021-06-09 ENCOUNTER — Other Ambulatory Visit (HOSPITAL_COMMUNITY): Payer: Self-pay

## 2021-06-11 ENCOUNTER — Other Ambulatory Visit (HOSPITAL_COMMUNITY): Payer: Self-pay

## 2021-06-11 DIAGNOSIS — F32A Depression, unspecified: Secondary | ICD-10-CM | POA: Diagnosis not present

## 2021-06-12 ENCOUNTER — Other Ambulatory Visit (HOSPITAL_COMMUNITY): Payer: Self-pay

## 2021-06-12 MED ORDER — WEGOVY 1.7 MG/0.75ML ~~LOC~~ SOAJ
1.7000 mg | SUBCUTANEOUS | 2 refills | Status: DC
  Filled 2021-06-12: qty 3, 28d supply, fill #0

## 2021-06-18 DIAGNOSIS — J301 Allergic rhinitis due to pollen: Secondary | ICD-10-CM | POA: Diagnosis not present

## 2021-06-18 DIAGNOSIS — J3081 Allergic rhinitis due to animal (cat) (dog) hair and dander: Secondary | ICD-10-CM | POA: Diagnosis not present

## 2021-06-18 DIAGNOSIS — J3089 Other allergic rhinitis: Secondary | ICD-10-CM | POA: Diagnosis not present

## 2021-06-24 ENCOUNTER — Other Ambulatory Visit (HOSPITAL_COMMUNITY): Payer: Self-pay

## 2021-06-25 DIAGNOSIS — J3089 Other allergic rhinitis: Secondary | ICD-10-CM | POA: Diagnosis not present

## 2021-06-25 DIAGNOSIS — J301 Allergic rhinitis due to pollen: Secondary | ICD-10-CM | POA: Diagnosis not present

## 2021-06-25 DIAGNOSIS — J3081 Allergic rhinitis due to animal (cat) (dog) hair and dander: Secondary | ICD-10-CM | POA: Diagnosis not present

## 2021-07-06 ENCOUNTER — Other Ambulatory Visit (HOSPITAL_COMMUNITY): Payer: Self-pay

## 2021-07-06 DIAGNOSIS — N3000 Acute cystitis without hematuria: Secondary | ICD-10-CM | POA: Diagnosis not present

## 2021-07-06 MED ORDER — SULFAMETHOXAZOLE-TRIMETHOPRIM 800-160 MG PO TABS
1.0000 | ORAL_TABLET | Freq: Two times a day (BID) | ORAL | 0 refills | Status: DC
  Filled 2021-07-06: qty 10, 5d supply, fill #0

## 2021-07-07 DIAGNOSIS — J301 Allergic rhinitis due to pollen: Secondary | ICD-10-CM | POA: Diagnosis not present

## 2021-07-07 DIAGNOSIS — J3089 Other allergic rhinitis: Secondary | ICD-10-CM | POA: Diagnosis not present

## 2021-07-07 DIAGNOSIS — J3081 Allergic rhinitis due to animal (cat) (dog) hair and dander: Secondary | ICD-10-CM | POA: Diagnosis not present

## 2021-07-31 DIAGNOSIS — J301 Allergic rhinitis due to pollen: Secondary | ICD-10-CM | POA: Diagnosis not present

## 2021-07-31 DIAGNOSIS — J3089 Other allergic rhinitis: Secondary | ICD-10-CM | POA: Diagnosis not present

## 2021-07-31 DIAGNOSIS — J3081 Allergic rhinitis due to animal (cat) (dog) hair and dander: Secondary | ICD-10-CM | POA: Diagnosis not present

## 2021-08-03 ENCOUNTER — Other Ambulatory Visit (HOSPITAL_COMMUNITY): Payer: Self-pay

## 2021-08-03 DIAGNOSIS — L237 Allergic contact dermatitis due to plants, except food: Secondary | ICD-10-CM | POA: Diagnosis not present

## 2021-08-03 DIAGNOSIS — E663 Overweight: Secondary | ICD-10-CM | POA: Diagnosis not present

## 2021-08-03 MED ORDER — CLOBETASOL PROPIONATE 0.05 % EX OINT
1.0000 | TOPICAL_OINTMENT | Freq: Two times a day (BID) | CUTANEOUS | 0 refills | Status: DC | PRN
  Filled 2021-08-03: qty 15, 8d supply, fill #0

## 2021-08-04 DIAGNOSIS — J301 Allergic rhinitis due to pollen: Secondary | ICD-10-CM | POA: Diagnosis not present

## 2021-08-05 DIAGNOSIS — J3081 Allergic rhinitis due to animal (cat) (dog) hair and dander: Secondary | ICD-10-CM | POA: Diagnosis not present

## 2021-08-05 DIAGNOSIS — J3089 Other allergic rhinitis: Secondary | ICD-10-CM | POA: Diagnosis not present

## 2021-08-06 ENCOUNTER — Other Ambulatory Visit (HOSPITAL_COMMUNITY): Payer: Self-pay

## 2021-08-10 ENCOUNTER — Other Ambulatory Visit (HOSPITAL_COMMUNITY): Payer: Self-pay

## 2021-08-12 ENCOUNTER — Other Ambulatory Visit (HOSPITAL_COMMUNITY): Payer: Self-pay

## 2021-08-12 MED ORDER — RYBELSUS 7 MG PO TABS
7.0000 mg | ORAL_TABLET | Freq: Every day | ORAL | 0 refills | Status: DC
  Filled 2021-08-12: qty 90, 90d supply, fill #0

## 2021-08-14 DIAGNOSIS — J3089 Other allergic rhinitis: Secondary | ICD-10-CM | POA: Diagnosis not present

## 2021-08-14 DIAGNOSIS — J3081 Allergic rhinitis due to animal (cat) (dog) hair and dander: Secondary | ICD-10-CM | POA: Diagnosis not present

## 2021-08-14 DIAGNOSIS — J301 Allergic rhinitis due to pollen: Secondary | ICD-10-CM | POA: Diagnosis not present

## 2021-08-20 DIAGNOSIS — J3081 Allergic rhinitis due to animal (cat) (dog) hair and dander: Secondary | ICD-10-CM | POA: Diagnosis not present

## 2021-08-20 DIAGNOSIS — J3089 Other allergic rhinitis: Secondary | ICD-10-CM | POA: Diagnosis not present

## 2021-08-20 DIAGNOSIS — J301 Allergic rhinitis due to pollen: Secondary | ICD-10-CM | POA: Diagnosis not present

## 2021-08-27 DIAGNOSIS — J301 Allergic rhinitis due to pollen: Secondary | ICD-10-CM | POA: Diagnosis not present

## 2021-08-27 DIAGNOSIS — J3081 Allergic rhinitis due to animal (cat) (dog) hair and dander: Secondary | ICD-10-CM | POA: Diagnosis not present

## 2021-08-27 DIAGNOSIS — J3089 Other allergic rhinitis: Secondary | ICD-10-CM | POA: Diagnosis not present

## 2021-08-31 ENCOUNTER — Telehealth: Payer: Self-pay | Admitting: Family Medicine

## 2021-08-31 DIAGNOSIS — F32A Depression, unspecified: Secondary | ICD-10-CM | POA: Diagnosis not present

## 2021-08-31 NOTE — Telephone Encounter (Signed)
Pt stated Dr. Quay Burow emailed her and advised that she would be accepting her as a new patient.  Is it ok to schedule her?

## 2021-08-31 NOTE — Telephone Encounter (Signed)
Note not needed 

## 2021-08-31 NOTE — Telephone Encounter (Signed)
Lmom for pt to call office to schedule np appointment.  Fyi

## 2021-09-01 ENCOUNTER — Other Ambulatory Visit (HOSPITAL_COMMUNITY): Payer: Self-pay

## 2021-09-04 ENCOUNTER — Other Ambulatory Visit (HOSPITAL_COMMUNITY): Payer: Self-pay

## 2021-09-04 DIAGNOSIS — J3089 Other allergic rhinitis: Secondary | ICD-10-CM | POA: Diagnosis not present

## 2021-09-04 DIAGNOSIS — J3081 Allergic rhinitis due to animal (cat) (dog) hair and dander: Secondary | ICD-10-CM | POA: Diagnosis not present

## 2021-09-04 DIAGNOSIS — J301 Allergic rhinitis due to pollen: Secondary | ICD-10-CM | POA: Diagnosis not present

## 2021-09-10 DIAGNOSIS — J3089 Other allergic rhinitis: Secondary | ICD-10-CM | POA: Diagnosis not present

## 2021-09-10 DIAGNOSIS — J3081 Allergic rhinitis due to animal (cat) (dog) hair and dander: Secondary | ICD-10-CM | POA: Diagnosis not present

## 2021-09-10 DIAGNOSIS — J301 Allergic rhinitis due to pollen: Secondary | ICD-10-CM | POA: Diagnosis not present

## 2021-09-18 DIAGNOSIS — J301 Allergic rhinitis due to pollen: Secondary | ICD-10-CM | POA: Diagnosis not present

## 2021-09-18 DIAGNOSIS — J3081 Allergic rhinitis due to animal (cat) (dog) hair and dander: Secondary | ICD-10-CM | POA: Diagnosis not present

## 2021-09-18 DIAGNOSIS — J3089 Other allergic rhinitis: Secondary | ICD-10-CM | POA: Diagnosis not present

## 2021-09-22 ENCOUNTER — Ambulatory Visit: Payer: 59 | Admitting: Internal Medicine

## 2021-09-24 ENCOUNTER — Other Ambulatory Visit (HOSPITAL_COMMUNITY): Payer: Self-pay

## 2021-09-24 MED ORDER — MONTELUKAST SODIUM 10 MG PO TABS
10.0000 mg | ORAL_TABLET | Freq: Every day | ORAL | 6 refills | Status: DC
  Filled 2021-09-24: qty 90, 90d supply, fill #0
  Filled 2021-12-02 – 2021-12-22 (×2): qty 90, 90d supply, fill #1
  Filled 2022-03-10: qty 30, 30d supply, fill #2

## 2021-09-25 DIAGNOSIS — J301 Allergic rhinitis due to pollen: Secondary | ICD-10-CM | POA: Diagnosis not present

## 2021-09-25 DIAGNOSIS — J3081 Allergic rhinitis due to animal (cat) (dog) hair and dander: Secondary | ICD-10-CM | POA: Diagnosis not present

## 2021-09-25 DIAGNOSIS — J3089 Other allergic rhinitis: Secondary | ICD-10-CM | POA: Diagnosis not present

## 2021-10-01 ENCOUNTER — Other Ambulatory Visit (HOSPITAL_COMMUNITY): Payer: Self-pay

## 2021-10-02 ENCOUNTER — Other Ambulatory Visit (HOSPITAL_COMMUNITY): Payer: Self-pay

## 2021-10-02 DIAGNOSIS — J301 Allergic rhinitis due to pollen: Secondary | ICD-10-CM | POA: Diagnosis not present

## 2021-10-02 DIAGNOSIS — J3089 Other allergic rhinitis: Secondary | ICD-10-CM | POA: Diagnosis not present

## 2021-10-02 DIAGNOSIS — J3081 Allergic rhinitis due to animal (cat) (dog) hair and dander: Secondary | ICD-10-CM | POA: Diagnosis not present

## 2021-10-02 MED ORDER — BUPROPION HCL ER (XL) 150 MG PO TB24
150.0000 mg | ORAL_TABLET | Freq: Every morning | ORAL | 0 refills | Status: DC
Start: 2021-10-02 — End: 2021-10-12
  Filled 2021-10-02: qty 90, 90d supply, fill #0

## 2021-10-11 ENCOUNTER — Encounter: Payer: Self-pay | Admitting: Internal Medicine

## 2021-10-11 DIAGNOSIS — J4520 Mild intermittent asthma, uncomplicated: Secondary | ICD-10-CM | POA: Insufficient documentation

## 2021-10-11 DIAGNOSIS — J309 Allergic rhinitis, unspecified: Secondary | ICD-10-CM | POA: Insufficient documentation

## 2021-10-11 NOTE — Progress Notes (Unsigned)
Subjective:    Patient ID: Colleen Schmitt, female    DOB: May 26, 1970, 51 y.o.   MRN: [id number]     HPI She is here to establish with a new pcp.  Colleen Schmitt is here for follow up of her chronic medical problems, including     Medications and allergies reviewed with patient and updated if appropriate.  Current Outpatient Medications on File Prior to Visit  Medication Sig Dispense Refill   albuterol (PROVENTIL HFA;VENTOLIN HFA) 108 (90 Base) MCG/ACT inhaler Inhale 2 puffs into the lungs every 6 (six) hours as needed for wheezing or shortness of breath.     albuterol (VENTOLIN HFA) 108 (90 Base) MCG/ACT inhaler Inhale 2 puffs into the lungs every 4 (four) hours as needed for cough, wheezing or shortness of breath 18 g 1   benzonatate (TESSALON) 200 MG capsule Take 1 capsule (200 mg total) by mouth 3 (three) times daily when needed for cough 60 capsule 0   budesonide-formoterol (SYMBICORT) 160-4.5 MCG/ACT inhaler Inhale 2 puffs into the lungs 2 (two) times daily. 10.2 g 0   buPROPion (WELLBUTRIN XL) 150 MG 24 hr tablet Take 1 tablet (150 mg total) by mouth in the morning. 90 tablet 0   Cholecalciferol (VITAMIN D3) 125 MCG (5000 UT) TABS Take 1 tablet by mouth daily.     clobetasol ointment (TEMOVATE) 8.31 % Apply 1 Application topically to the affected area 2 (two) times daily for 3-5 days as needed for poison ivy. 15 g 0   desloratadine (CLARINEX) 5 MG tablet Take 1 tablet (5 mg total) by mouth daily. 30 tablet 6   dexlansoprazole (DEXILANT) 60 MG capsule Take 60 mg by mouth daily as needed.      dexlansoprazole (DEXILANT) 60 MG capsule Take 1 capsule (60 mg total) by mouth daily. 90 capsule 3   EPINEPHrine (EPIPEN 2-PAK) 0.3 mg/0.3 mL IJ SOAJ injection Inject 0.3 mg into the muscle as needed for severe allergic reaction. 2 each 1   famotidine (PEPCID) 20 MG tablet Take 20 mg by mouth 2 (two) times daily as needed.      famotidine (PEPCID) 40 MG tablet Take 1 tablet (40 mg total) by  mouth daily for heartburn 90 tablet 3   FLUoxetine (PROZAC) 10 MG capsule      levocetirizine (XYZAL) 5 MG tablet Take 1 tablet (5 mg total) by mouth every evening. 90 tablet 3   mometasone (NASONEX) 50 MCG/ACT nasal spray Place 1-2 sprays into each nostril daily. 17 g 3   montelukast (SINGULAIR) 10 MG tablet Take 1 tablet (10 mg total) by mouth at bedtime. 30 tablet 6   Multiple Vitamin (MULTIVITAMIN) tablet Take by mouth.     Omega-3 Fatty Acids (OMEGA 3 500 PO) Take 1 capsule by mouth daily.     omeprazole (PRILOSEC) 20 MG capsule Take 1 capsule (20 mg total) by mouth in the morning 30 minutes before a meal 90 capsule 1   omeprazole (PRILOSEC) 40 MG capsule TAKE 1 CAPSULE BY MOUTH DAILY 20 MINUTES BEFORE BREAKFAST 90 capsule 4   omeprazole (PRILOSEC) 40 MG capsule Take 1 capsule (40 mg total) by mouth daily and 2 times per day when needed 30 minutes before meals. 145 capsule 1   Probiotic Product (PROBIOTIC DAILY) CAPS Take 1 capsule by mouth daily.     ranitidine (ZANTAC) 300 MG tablet as needed.      Semaglutide (RYBELSUS) 3 MG TABS Take 3 mg by mouth daily at least  30 minutes before meal 30 tablet 3   Semaglutide (RYBELSUS) 7 MG TABS Take 1 tablet (7 mg) by mouth daily at least 30 minutes before first food, beverage or other medicine of the day. 30 tablet 4   Semaglutide (RYBELSUS) 7 MG TABS Take 1 tablet by mouth daily at least 30 minutes before first food,beverage or other oral medicine of the day 90 tablet 0   Semaglutide (RYBELSUS) 7 MG TABS Take 7 mg by mouth daily 30 minutes before first food, beverage or other oral medicine of the day 90 tablet 0   Semaglutide-Weight Management (WEGOVY) 0.5 MG/0.5ML SOAJ Inject 0.5 mg into the skin once a week. 2 mL 4   Semaglutide-Weight Management (WEGOVY) 1.7 MG/0.75ML SOAJ Inject 1.7 mg into the skin once a week. 3 mL 2   sertraline (ZOLOFT) 25 MG tablet Take 1 tablet (25 mg total) by mouth in the morning for 10 days, THEN increase to 2 tablets (50  mg total) daily. 60 tablet 3   topiramate (TOPAMAX) 25 MG tablet TAKE 1 TABLET BY MOUTH DAILY 30 tablet 0   topiramate (TOPAMAX) 25 MG tablet Take 1 tablet (25 mg total) by mouth daily. 7 tablet 0   topiramate (TOPAMAX) 50 MG tablet Take 1 tablet (50 mg total) by mouth daily. 30 tablet 0   triamcinolone (NASACORT) 55 MCG/ACT AERO nasal inhaler Place 2 sprays into the nose daily.     Zinc 30 MG CAPS Take 1 capsule by mouth daily.     No current facility-administered medications on file prior to visit.     Review of Systems     Objective:  There were no vitals filed for this visit. BP Readings from Last 3 Encounters:  02/22/18 106/77  09/24/16 (!) 117/92  05/21/16 130/76   Wt Readings from Last 3 Encounters:  02/22/18 179 lb (81.2 kg)  09/24/16 182 lb (82.6 kg)  05/21/16 193 lb (87.5 kg)   There is no height or weight on file to calculate BMI.    Physical Exam     No results found for: "WBC", "HGB", "HCT", "PLT", "GLUCOSE", "CHOL", "TRIG", "HDL", "LDLDIRECT", "[place]", "ALT", "AST", "NA", "K", "CL", "CREATININE", "BUN", "CO2", "TSH", "PSA", "INR", "GLUF", "HGBA1C", "MICROALBUR"   Assessment & Plan:    See Problem List for Assessment and Plan of chronic medical problems.

## 2021-10-11 NOTE — Patient Instructions (Addendum)
Blood work was ordered.     Medications changes include :   increase wellbutrin xl to 300 mg - stop sertraline.      Your prescription(s) have been sent to your pharmacy.      Return in about 1 year (around 10/13/2022) for Physical Exam.    Health Maintenance, Female Adopting a healthy lifestyle and getting preventive care are important in promoting health and wellness. Ask your health care provider about: The right schedule for you to have regular tests and exams. Things you can do on your own to prevent diseases and keep yourself healthy. What should I know about diet, weight, and exercise? Eat a healthy diet  Eat a diet that includes plenty of vegetables, fruits, low-fat dairy products, and lean protein. Do not eat a lot of foods that are high in solid fats, added sugars, or sodium. Maintain a healthy weight Body mass index (BMI) is used to identify weight problems. It estimates body fat based on height and weight. Your health care provider can help determine your BMI and help you achieve or maintain a healthy weight. Get regular exercise Get regular exercise. This is one of the most important things you can do for your health. Most adults should: Exercise for at least 150 minutes each week. The exercise should increase your heart rate and make you sweat (moderate-intensity exercise). Do strengthening exercises at least twice a week. This is in addition to the moderate-intensity exercise. Spend less time sitting. Even light physical activity can be beneficial. Watch cholesterol and blood lipids Have your blood tested for lipids and cholesterol at 53 years of age, then have this test every 5 years. Have your cholesterol levels checked more often if: Your lipid or cholesterol levels are high. You are older than 53 years of age. You are at high risk for heart disease. What should I know about cancer screening? Depending on your health history and family history, you may  need to have cancer screening at various ages. This may include screening for: Breast cancer. Cervical cancer. Colorectal cancer. Skin cancer. Lung cancer. What should I know about heart disease, diabetes, and high blood pressure? Blood pressure and heart disease High blood pressure causes heart disease and increases the risk of stroke. This is more likely to develop in people who have high blood pressure readings or are overweight. Have your blood pressure checked: Every 3-5 years if you are 59-81 years of age. Every year if you are 4 years old or older. Diabetes Have regular diabetes screenings. This checks your fasting blood sugar level. Have the screening done: Once every three years after age 96 if you are at a normal weight and have a low risk for diabetes. More often and at a younger age if you are overweight or have a high risk for diabetes. What should I know about preventing infection? Hepatitis B If you have a higher risk for hepatitis B, you should be screened for this virus. Talk with your health care provider to find out if you are at risk for hepatitis B infection. Hepatitis C Testing is recommended for: Everyone born from [birth year] through 1967. Anyone with known risk factors for hepatitis C. Sexually transmitted infections (STIs) Get screened for STIs, including gonorrhea and chlamydia, if: You are sexually active and are younger than 53 years of age. You are older than 53 years of age and your health care provider tells you that you are at risk for this type of infection.  Your sexual activity has changed since you were last screened, and you are at increased risk for chlamydia or gonorrhea. Ask your health care provider if you are at risk. Ask your health care provider about whether you are at high risk for HIV. Your health care provider may recommend a prescription medicine to help prevent HIV infection. If you choose to take medicine to prevent HIV, you should first get  tested for HIV. You should then be tested every 3 months for as long as you are taking the medicine. Pregnancy If you are about to stop having your period (premenopausal) and you may become pregnant, seek counseling before you get pregnant. Take 400 to 800 micrograms (mcg) of folic acid every day if you become pregnant. Ask for birth control (contraception) if you want to prevent pregnancy. Osteoporosis and menopause Osteoporosis is a disease in which the bones lose minerals and strength with aging. This can result in bone fractures. If you are 55 years old or older, or if you are at risk for osteoporosis and fractures, ask your health care provider if you should: Be screened for bone loss. Take a calcium or vitamin D supplement to lower your risk of fractures. Be given hormone replacement therapy (HRT) to treat symptoms of menopause. Follow these instructions at home: Alcohol use Do not drink alcohol if: Your health care provider tells you not to drink. You are pregnant, may be pregnant, or are planning to become pregnant. If you drink alcohol: Limit how much you have to: 0-1 drink a day. Know how much alcohol is in your drink. In the [place] one drink equals one 12 oz bottle of beer (355 mL), one 5 oz glass of wine (148 mL), or one 1 oz glass of hard liquor (44 mL). Lifestyle Do not use any products that contain nicotine or tobacco. These products include cigarettes, chewing tobacco, and vaping devices, such as e-cigarettes. If you need help quitting, ask your health care provider. Do not use street drugs. Do not share needles. Ask your health care provider for help if you need support or information about quitting drugs. General instructions Schedule regular health, dental, and eye exams. Stay current with your vaccines. Tell your health care provider if: You often feel depressed. You have ever been abused or do not feel safe at home. Summary Adopting a healthy lifestyle and getting  preventive care are important in promoting health and wellness. Follow your health care provider's instructions about healthy diet, exercising, and getting tested or screened for diseases. Follow your health care provider's instructions on monitoring your cholesterol and blood pressure. This information is not intended to replace advice given to you by your health care provider. Make sure you discuss any questions you have with your health care provider. Document Revised: 05/19/2020 Document Reviewed: 05/19/2020 Elsevier Patient Education  [place].

## 2021-10-12 ENCOUNTER — Other Ambulatory Visit (HOSPITAL_COMMUNITY): Payer: Self-pay

## 2021-10-12 ENCOUNTER — Ambulatory Visit (INDEPENDENT_AMBULATORY_CARE_PROVIDER_SITE_OTHER): Payer: 59 | Admitting: Internal Medicine

## 2021-10-12 VITALS — BP 108/78 | HR 80 | Temp 98.6°F | Ht 68.5 in | Wt 204.0 lb

## 2021-10-12 DIAGNOSIS — F419 Anxiety disorder, unspecified: Secondary | ICD-10-CM | POA: Diagnosis not present

## 2021-10-12 DIAGNOSIS — Z833 Family history of diabetes mellitus: Secondary | ICD-10-CM | POA: Diagnosis not present

## 2021-10-12 DIAGNOSIS — J4520 Mild intermittent asthma, uncomplicated: Secondary | ICD-10-CM

## 2021-10-12 DIAGNOSIS — E88819 Insulin resistance, unspecified: Secondary | ICD-10-CM | POA: Insufficient documentation

## 2021-10-12 DIAGNOSIS — K219 Gastro-esophageal reflux disease without esophagitis: Secondary | ICD-10-CM

## 2021-10-12 DIAGNOSIS — Z0000 Encounter for general adult medical examination without abnormal findings: Secondary | ICD-10-CM

## 2021-10-12 DIAGNOSIS — J309 Allergic rhinitis, unspecified: Secondary | ICD-10-CM | POA: Diagnosis not present

## 2021-10-12 DIAGNOSIS — E6609 Other obesity due to excess calories: Secondary | ICD-10-CM

## 2021-10-12 DIAGNOSIS — Z6830 Body mass index (BMI) 30.0-30.9, adult: Secondary | ICD-10-CM | POA: Diagnosis not present

## 2021-10-12 DIAGNOSIS — F32A Depression, unspecified: Secondary | ICD-10-CM

## 2021-10-12 LAB — COMPREHENSIVE METABOLIC PANEL
ALT: 12 U/L (ref 0–35)
AST: 15 U/L (ref 0–37)
Albumin: 4.5 g/dL (ref 3.5–5.2)
Alkaline Phosphatase: 65 U/L (ref 39–117)
BUN: 13 mg/dL (ref 6–23)
CO2: 28 mEq/L (ref 19–32)
Calcium: 9.9 mg/dL (ref 8.4–10.5)
Chloride: 103 mEq/L (ref 96–112)
Creatinine, Ser: 0.77 mg/dL (ref 0.40–1.20)
GFR: 88.32 mL/min (ref >60.00)
Glucose, Bld: 94 mg/dL (ref 70–99)
Potassium: 3.9 mEq/L (ref 3.5–5.1)
Sodium: 138 mEq/L (ref 135–145)
Total Bilirubin: 0.4 mg/dL (ref 0.2–1.2)
Total Protein: 7.2 g/dL (ref 6.0–8.3)

## 2021-10-12 LAB — CBC WITH DIFFERENTIAL/PLATELET
Basophils Absolute: 0.1 10*3/uL (ref 0.0–0.1)
Basophils Relative: 1.2 % (ref 0.0–3.0)
Eosinophils Absolute: 0.1 10*3/uL (ref 0.0–0.7)
Eosinophils Relative: 2.4 % (ref 0.0–5.0)
HCT: 39.2 % (ref 36.0–46.0)
Hemoglobin: 12.9 g/dL (ref 12.0–15.0)
Lymphocytes Relative: 41.8 % (ref 12.0–46.0)
Lymphs Abs: 1.8 10*3/uL (ref 0.7–4.0)
MCHC: 33 g/dL (ref 30.0–36.0)
MCV: 87.5 fl (ref 78.0–100.0)
Monocytes Absolute: 0.5 10*3/uL (ref 0.1–1.0)
Monocytes Relative: 11.3 % (ref 3.0–12.0)
Neutro Abs: 1.9 10*3/uL (ref 1.4–7.7)
Neutrophils Relative %: 43.3 % (ref 43.0–77.0)
Platelets: 229 10*3/uL (ref 150.0–400.0)
RBC: 4.48 Mil/uL (ref 3.87–5.11)
RDW: 13.5 % (ref 11.5–15.5)
WBC: 4.3 10*3/uL (ref 4.0–10.5)

## 2021-10-12 LAB — LIPID PANEL
Cholesterol: 212 mg/dL — ABNORMAL HIGH (ref 0–200)
HDL: 81.5 mg/dL (ref >39.00)
LDL Cholesterol: 103 mg/dL — ABNORMAL HIGH (ref 0–99)
NonHDL: 130.81
Total CHOL/HDL Ratio: 3
Triglycerides: 141 mg/dL (ref 0.0–149.0)
VLDL: 28.2 mg/dL (ref 0.0–40.0)

## 2021-10-12 LAB — HEMOGLOBIN A1C: Hgb A1c MFr Bld: 5.5 % (ref 4.6–6.5)

## 2021-10-12 LAB — TSH: TSH: 1.91 u[IU]/mL (ref 0.35–5.50)

## 2021-10-12 MED ORDER — WEGOVY 0.25 MG/0.5ML ~~LOC~~ SOAJ
0.2500 mg | SUBCUTANEOUS | 0 refills | Status: DC
  Filled 2021-10-12 – 2021-10-21 (×3): qty 2, 28d supply, fill #0

## 2021-10-12 MED ORDER — BUPROPION HCL ER (XL) 300 MG PO TB24
300.0000 mg | ORAL_TABLET | Freq: Every day | ORAL | 5 refills | Status: DC
  Filled 2021-10-12 – 2021-10-20 (×2): qty 30, 30d supply, fill #0
  Filled 2021-11-18 – 2021-12-02 (×2): qty 30, 30d supply, fill #1
  Filled 2022-01-08 (×2): qty 30, 30d supply, fill #2
  Filled 2022-02-04: qty 30, 30d supply, fill #3
  Filled 2022-03-10: qty 30, 30d supply, fill #4
  Filled 2022-04-17: qty 30, 30d supply, fill #5

## 2021-10-12 NOTE — Assessment & Plan Note (Signed)
Chronic  Well controlled On allergy injections Management per Dr Donneta Romberg

## 2021-10-12 NOTE — Assessment & Plan Note (Signed)
Chronic Has tried phentermine, topamax in the past - not effective ozempic was effective  - stopped being covered rybelsus - not as effective Ideally I think wegovy would be best for her if approved and available Discussed increasing exercise Low sugar/carb diet, smaller portions

## 2021-10-12 NOTE — Assessment & Plan Note (Signed)
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

## 2021-10-12 NOTE — Assessment & Plan Note (Signed)
Chronic Has anxiety, mild depression at times Currently on wellbutrin xl 150 mg daily and sertraline - decreased sertraline to 25 mg daily Will stop sertraline and increase wellbutrin xl to 300 mg daily - can inc to 450 mg if needed - may help some with attention If depression / anxiety not controlled can add low dose SSRI ( not prozac or sertraline)

## 2021-10-12 NOTE — Assessment & Plan Note (Addendum)
Chronic Fairly controlled Continue pepcid 40 mg and omeparaoel 40 mg daily prn GERD diet Will work on weight loss

## 2021-10-13 ENCOUNTER — Encounter: Payer: Self-pay | Admitting: Internal Medicine

## 2021-10-13 ENCOUNTER — Other Ambulatory Visit (HOSPITAL_COMMUNITY): Payer: Self-pay

## 2021-10-13 LAB — INSULIN, RANDOM: Insulin: 5.2 u[IU]/mL

## 2021-10-19 ENCOUNTER — Other Ambulatory Visit (HOSPITAL_COMMUNITY): Payer: Self-pay

## 2021-10-20 ENCOUNTER — Other Ambulatory Visit (HOSPITAL_COMMUNITY): Payer: Self-pay

## 2021-10-20 DIAGNOSIS — J301 Allergic rhinitis due to pollen: Secondary | ICD-10-CM | POA: Diagnosis not present

## 2021-10-20 DIAGNOSIS — J3081 Allergic rhinitis due to animal (cat) (dog) hair and dander: Secondary | ICD-10-CM | POA: Diagnosis not present

## 2021-10-20 DIAGNOSIS — J3089 Other allergic rhinitis: Secondary | ICD-10-CM | POA: Diagnosis not present

## 2021-10-21 ENCOUNTER — Other Ambulatory Visit (HOSPITAL_COMMUNITY): Payer: Self-pay

## 2021-10-22 ENCOUNTER — Other Ambulatory Visit (HOSPITAL_COMMUNITY): Payer: Self-pay

## 2021-10-23 DIAGNOSIS — F32A Depression, unspecified: Secondary | ICD-10-CM | POA: Diagnosis not present

## 2021-11-03 ENCOUNTER — Other Ambulatory Visit (HOSPITAL_COMMUNITY): Payer: Self-pay

## 2021-11-03 MED ORDER — PHENTERMINE HCL 37.5 MG PO CAPS
37.5000 mg | ORAL_CAPSULE | ORAL | 2 refills | Status: DC
  Filled 2021-11-03: qty 30, 30d supply, fill #0
  Filled 2021-12-02: qty 30, 30d supply, fill #1

## 2021-11-03 NOTE — Addendum Note (Signed)
Addended by: Binnie Rail on: 11/03/2021 07:51 AM   Modules accepted: Orders

## 2021-11-04 ENCOUNTER — Other Ambulatory Visit (HOSPITAL_COMMUNITY): Payer: Self-pay

## 2021-11-05 ENCOUNTER — Other Ambulatory Visit (HOSPITAL_COMMUNITY): Payer: Self-pay

## 2021-11-05 DIAGNOSIS — J3081 Allergic rhinitis due to animal (cat) (dog) hair and dander: Secondary | ICD-10-CM | POA: Diagnosis not present

## 2021-11-05 DIAGNOSIS — J301 Allergic rhinitis due to pollen: Secondary | ICD-10-CM | POA: Diagnosis not present

## 2021-11-05 DIAGNOSIS — J3089 Other allergic rhinitis: Secondary | ICD-10-CM | POA: Diagnosis not present

## 2021-11-17 ENCOUNTER — Encounter: Payer: Self-pay | Admitting: Family Medicine

## 2021-11-17 ENCOUNTER — Other Ambulatory Visit (HOSPITAL_COMMUNITY): Payer: Self-pay

## 2021-11-17 ENCOUNTER — Telehealth (INDEPENDENT_AMBULATORY_CARE_PROVIDER_SITE_OTHER): Payer: 59 | Admitting: Family Medicine

## 2021-11-17 VITALS — Temp 98.5°F | Ht 68.5 in | Wt 185.0 lb

## 2021-11-17 DIAGNOSIS — R051 Acute cough: Secondary | ICD-10-CM | POA: Diagnosis not present

## 2021-11-17 DIAGNOSIS — R0981 Nasal congestion: Secondary | ICD-10-CM

## 2021-11-17 MED ORDER — BENZONATATE 100 MG PO CAPS
100.0000 mg | ORAL_CAPSULE | Freq: Two times a day (BID) | ORAL | 0 refills | Status: DC | PRN
  Filled 2021-11-17: qty 30, 8d supply, fill #0

## 2021-11-17 NOTE — Progress Notes (Signed)
Virtual Visit via Video Note  I connected with Colleen Schmitt  on 11/17/21 at 11:00 AM EST by a video enabled telemedicine application and verified that I am speaking with the correct person using two identifiers.  Location patient: [place] Location provider:work or home office Persons participating in the virtual visit: patient, provider  I discussed the limitations and requested verbal permission for telemedicine visit. The patient expressed understanding and agreed to proceed.   HPI:  Acute telemedicine visit for cough and congestion: -Onset:about 3 days ago -Symptoms include: nasal congestion, pnd, scratchy throat, cough, some chills, mild SOB at time - but has not required her albuterol -did a covid test x1 which was negative -Denies: fever, CP, NVD, body aches -Has tried:nothing -Pertinent past medical history: see below -Pertinent medication allergies: Allergies  Allergen Reactions   Adhesive [Tape] Rash   Penicillins Itching    Palms of hands  -COVID-19 vaccine status:  Immunization History  Administered Date(s) Administered   Influenza, High Dose Seasonal PF 12/19/2015, 12/17/2016, 01/03/2018, 01/14/2020, 10/23/2020, 01/13/2021   Influenza,inj,Quad PF,6+ Mos 11/09/2021   PFIZER(Purple Top)SARS-COV-2 Vaccination 01/14/2020   Pneumococcal Polysaccharide-23 12/19/2015, 12/17/2016, 01/03/2018, 01/14/2020     ROS: See pertinent positives and negatives per HPI.  Past Medical History:  Diagnosis Date   Abdominal pain    Anxiety    Asthma    Contact lens/glasses fitting    Cough    Depression    Fatigue    GERD (gastroesophageal reflux disease)    Hoarseness    Poor appetite     Past Surgical History:  Procedure Laterality Date   CARPAL TUNNEL RELEASE Right 05/21/2016   Procedure: CARPAL TUNNEL RELEASE;  Surgeon: Roseanne Kaufman, MD;  Location: [facility];  Service: Orthopedics;  Laterality: Right;  Requests 1 hr   CARPAL TUNNEL RELEASE Left 09/24/2016    Procedure: Limited open CARPAL TUNNEL RELEASE;  Surgeon: Roseanne Kaufman, MD;  Location: [facility];  Service: Orthopedics;  Laterality: Left;   lap band  02/14/2006   NASAL SEPTOPLASTY W/ TURBINOPLASTY N/A 05/21/2016   Procedure: NASAL SEPTOPLASTY WITH TURBINATE REDUCTION;  Surgeon: Leta Baptist, MD;  Location: [facility];  Service: ENT;  Laterality: N/A;   ORIF FEMUR FRACTURE  1993   right   SKIN BIOPSY       Current Outpatient Medications:    albuterol (VENTOLIN HFA) 108 (90 Base) MCG/ACT inhaler, Inhale 2 puffs into the lungs every 4 (four) hours as needed for cough, wheezing or shortness of breath, Disp: 18 g, Rfl: 1   benzonatate (TESSALON PERLES) 100 MG capsule, Take 1-2 capsules (100-200 mg total) by mouth up to 2 (two) times daily as needed for cough., Disp: 30 capsule, Rfl: 0   buPROPion (WELLBUTRIN XL) 300 MG 24 hr tablet, Take 1 tablet (300 mg total) by mouth daily., Disp: 30 tablet, Rfl: 5   Cholecalciferol (VITAMIN D3) 125 MCG (5000 UT) TABS, Take 1 tablet by mouth daily., Disp: , Rfl:    EPINEPHrine (EPIPEN 2-PAK) 0.3 mg/0.3 mL IJ SOAJ injection, Inject 0.3 mg into the muscle as needed for severe allergic reaction., Disp: 2 each, Rfl: 1   famotidine (PEPCID) 40 MG tablet, Take 1 tablet (40 mg total) by mouth daily for heartburn, Disp: 90 tablet, Rfl: 3   levocetirizine (XYZAL) 5 MG tablet, Take 1 tablet (5 mg total) by mouth every evening., Disp: 90 tablet, Rfl: 3   mometasone (NASONEX) 50 MCG/ACT nasal spray, Place 1-2 sprays into each nostril daily.,  Disp: 17 g, Rfl: 3   montelukast (SINGULAIR) 10 MG tablet, Take 1 tablet (10 mg total) by mouth at bedtime., Disp: 30 tablet, Rfl: 6   Multiple Vitamin (MULTIVITAMIN) tablet, Take by mouth., Disp: , Rfl:    Omega-3 Fatty Acids (OMEGA 3 500 PO), Take 1 capsule by mouth daily., Disp: , Rfl:    omeprazole (PRILOSEC) 40 MG capsule, Take 1 capsule (40 mg total) by mouth daily and 2 times per day when needed 30  minutes before meals., Disp: 145 capsule, Rfl: 1   phentermine 37.5 MG capsule, Take 1 capsule (37.5 mg total) by mouth every morning., Disp: 30 capsule, Rfl: 2   Probiotic Product (PROBIOTIC DAILY) CAPS, Take 1 capsule by mouth daily., Disp: , Rfl:    Zinc 30 MG CAPS, Take 1 capsule by mouth daily., Disp: , Rfl:    Semaglutide-Weight Management (WEGOVY) 0.25 MG/0.5ML SOAJ, Inject 0.25 mg into the skin once a week. (Patient not taking: Reported on 11/17/2021), Disp: 2 mL, Rfl: 0  EXAM:  VITALS per patient if applicable:  GENERAL: alert, oriented, appears well and in no acute distress  HEENT: atraumatic, conjunttiva clear, no obvious abnormalities on inspection of external nose and ears  NECK: normal movements of the head and neck  LUNGS: on inspection no signs of respiratory distress, breathing rate appears normal, no obvious gross SOB, gasping or wheezing  CV: no obvious cyanosis  MS: moves all visible extremities without noticeable abnormality  PSYCH/NEURO: pleasant and cooperative, no obvious depression or anxiety, speech and thought processing grossly intact  ASSESSMENT AND PLAN:  Discussed the following assessment and plan:  Acute cough  Nasal congestion  -we discussed possible serious and likely etiologies, options for evaluation and workup, limitations of telemedicine visit vs in person visit, treatment, treatment risks and precautions. Pt is agreeable to treatment via telemedicine at this moment. 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 We opted for repeat covid testing, alb q 6 hours prn, tessalon for cough.  Advised to seek prompt virtual visit or in person care if worsening, new symptoms arise, or if is not improving with treatment as expected per our conversation of expected course. Discussed options for follow up care. Did let this patient know that I do telemedicine on Tuesdays and Thursdays for [facility] and those are the days I am logged into the system.  Advised to schedule follow up visit with PCP, [facility] virtual visits or [facility] if any further questions or concerns to avoid delays in care.   I discussed the assessment and treatment plan with the patient. The patient was provided an opportunity to ask questions and all were answered. The patient agreed with the plan and demonstrated an understanding of the instructions.     Lucretia Kern, DO

## 2021-11-17 NOTE — Patient Instructions (Signed)
  HOME CARE TIPS:  -COVID19 testing information: [email or web address]  Most pharmacies also offer testing and home test kits. If the Covid19 test is positive and you desire antiviral treatment, please contact a [facility] or schedule a follow up virtual visit through your primary care office or through the [facility].  Other test to treat options: [email or web address]  -I sent the medication(s) we discussed to your pharmacy: Meds ordered this encounter  Medications   benzonatate (TESSALON PERLES) 100 MG capsule    Sig: Take 1-2 capsules (100-200 mg total) by mouth up to 2 (two) times daily as needed for cough.    Dispense:  30 capsule    Refill:  0    Albuterol 2 puffs every 6 hours as needed if any asthma symptoms  -nasal saline sinus rinses twice daily  -stay hydrated, drink plenty of fluids and eat small healthy meals - avoid dairy  -can take 1000 IU (48mg) Vit D3 and 100-500 mg of Vit C daily per instructions  -follow up with your doctor in 2-3 days unless improving and feeling better  -stay home while sick, except to seek medical care. If you have COVID19, you will likely be contagious for 7-10 days. Flu or Influenza is likely contagious for about 7 days. Other respiratory viral infections remain contagious for 5-10+ days depending on the virus and many other factors. Wear a good mask that fits snugly (such as N95 or KN95) if around others to reduce the risk of transmission.  It was nice to meet you today, and I really hope you are feeling better soon. I help [facility] out with telemedicine visits on Tuesdays and Thursdays and am happy to help if you need a follow up virtual visit on those days. Otherwise, if you have any concerns or questions following this visit please schedule a follow up visit with your Primary Care doctor or seek care at a local urgent care clinic to avoid delays in care.    Seek in  person care or schedule a follow up video visit promptly if your symptoms worsen, new concerns arise or you are not improving with treatment. Call 911 and/or seek emergency care if your symptoms are severe or life threatening.

## 2021-11-18 ENCOUNTER — Other Ambulatory Visit (HOSPITAL_COMMUNITY): Payer: Self-pay

## 2021-11-18 ENCOUNTER — Telehealth: Payer: 59 | Admitting: Physician Assistant

## 2021-11-18 ENCOUNTER — Telehealth: Payer: Self-pay | Admitting: Internal Medicine

## 2021-11-18 DIAGNOSIS — U071 COVID-19: Secondary | ICD-10-CM

## 2021-11-18 MED ORDER — NIRMATRELVIR&RITONAVIR 300/100 20 X 150 MG & 10 X 100MG PO TBPK
3.0000 | ORAL_TABLET | Freq: Two times a day (BID) | ORAL | 0 refills | Status: AC
  Filled 2021-11-18 (×2): qty 30, 5d supply, fill #0

## 2021-11-18 MED ORDER — ALBUTEROL SULFATE HFA 108 (90 BASE) MCG/ACT IN AERS
2.0000 | INHALATION_SPRAY | Freq: Four times a day (QID) | RESPIRATORY_TRACT | 0 refills | Status: DC | PRN
  Filled 2021-11-18 (×2): qty 6.7, 25d supply, fill #0

## 2021-11-18 MED ORDER — MOLNUPIRAVIR 200 MG PO CAPS
4.0000 | ORAL_CAPSULE | Freq: Two times a day (BID) | ORAL | 0 refills | Status: DC
  Filled 2021-11-18: qty 40, 5d supply, fill #0

## 2021-11-18 NOTE — Telephone Encounter (Signed)
Sent anti-viral - molnupiravir

## 2021-11-18 NOTE — Patient Instructions (Signed)
Colleen Schmitt, thank you for joining [name], PA-C for today's virtual visit.  While this provider is not your primary care provider (PCP), if your PCP is located in our provider database this encounter information will be shared with them immediately following your visit.   [facility] account gives you access to today's visit and all your visits, tests, and labs performed at [facility] " click here if you don't have a [facility] account or go to [email or web address]  Consent: (Patient) Colleen Schmitt provided verbal consent for this virtual visit at the beginning of the encounter.  Current Medications:  Current Outpatient Medications:    albuterol (VENTOLIN HFA) 108 (90 Base) MCG/ACT inhaler, Inhale 2 puffs into the lungs every 4 (four) hours as needed for cough, wheezing or shortness of breath, Disp: 18 g, Rfl: 1   benzonatate (TESSALON PERLES) 100 MG capsule, Take 1-2 capsules (100-200 mg total) by mouth up to 2 (two) times daily as needed for cough., Disp: 30 capsule, Rfl: 0   buPROPion (WELLBUTRIN XL) 300 MG 24 hr tablet, Take 1 tablet (300 mg total) by mouth daily., Disp: 30 tablet, Rfl: 5   Cholecalciferol (VITAMIN D3) 125 MCG (5000 UT) TABS, Take 1 tablet by mouth daily., Disp: , Rfl:    EPINEPHrine (EPIPEN 2-PAK) 0.3 mg/0.3 mL IJ SOAJ injection, Inject 0.3 mg into the muscle as needed for severe allergic reaction., Disp: 2 each, Rfl: 1   famotidine (PEPCID) 40 MG tablet, Take 1 tablet (40 mg total) by mouth daily for heartburn, Disp: 90 tablet, Rfl: 3   levocetirizine (XYZAL) 5 MG tablet, Take 1 tablet (5 mg total) by mouth every evening., Disp: 90 tablet, Rfl: 3   mometasone (NASONEX) 50 MCG/ACT nasal spray, Place 1-2 sprays into each nostril daily., Disp: 17 g, Rfl: 3   montelukast (SINGULAIR) 10 MG tablet, Take 1 tablet (10 mg total) by mouth at bedtime., Disp: 30 tablet, Rfl: 6   Multiple Vitamin (MULTIVITAMIN) tablet, Take by  mouth., Disp: , Rfl:    Omega-3 Fatty Acids (OMEGA 3 500 PO), Take 1 capsule by mouth daily., Disp: , Rfl:    omeprazole (PRILOSEC) 40 MG capsule, Take 1 capsule (40 mg total) by mouth daily and 2 times per day when needed 30 minutes before meals., Disp: 145 capsule, Rfl: 1   phentermine 37.5 MG capsule, Take 1 capsule (37.5 mg total) by mouth every morning., Disp: 30 capsule, Rfl: 2   Probiotic Product (PROBIOTIC DAILY) CAPS, Take 1 capsule by mouth daily., Disp: , Rfl:    Semaglutide-Weight Management (WEGOVY) 0.25 MG/0.5ML SOAJ, Inject 0.25 mg into the skin once a week. (Patient not taking: Reported on 11/17/2021), Disp: 2 mL, Rfl: 0   Zinc 30 MG CAPS, Take 1 capsule by mouth daily., Disp: , Rfl:    Medications ordered in this encounter:  No orders of the defined types were placed in this encounter.    *If you need refills on other medications prior to your next appointment, please contact your pharmacy*  Follow-Up: Call back or seek an in-person evaluation if the symptoms worsen or if the condition fails to improve as anticipated.  [facility] [phone]  Other Instructions Please keep well-hydrated and get plenty of rest. Start a saline nasal rinse to flush out your nasal passages. You can use plain Mucinex to help thin congestion. If you have a humidifier, running in the bedroom at night. I want you to start OTC  vitamin D3 1000 units daily, vitamin C 1000 mg daily, and a zinc supplement. Please take prescribed medications as directed.  You have been enrolled in a MyChart symptom monitoring program. Please answer these questions daily so we can keep track of how you are doing.  You were to quarantine for 5 days from onset of your symptoms.  After day 5, if you have had no fever and you are feeling better, you can end quarantine but need to mask for an additional 5 days. After day 5 if you have a fever or are having significant symptoms, please quarantine for full  10 days.  If you note any worsening of symptoms, any significant shortness of breath or any chest pain, please seek ER evaluation ASAP.  Please do not delay care!  COVID-19: What to Do if You Are Sick If you test positive and are an older adult or someone who is at high risk of getting very sick from COVID-19, treatment may be available. Contact a healthcare provider right away after a positive test to determine if you are eligible, even if your symptoms are mild right now. You can also visit a Test to Treat location and, if eligible, receive a prescription from a provider. Don't delay: Treatment must be started within the first few days to be effective. If you have a fever, cough, or other symptoms, you might have COVID-19. Most people have mild illness and are able to recover at home. If you are sick: Keep track of your symptoms. If you have an emergency warning sign (including trouble breathing), call 911. Steps to help prevent the spread of COVID-19 if you are sick If you are sick with COVID-19 or think you might have COVID-19, follow the steps below to care for yourself and to help protect other people in your home and community. Stay home except to get medical care Stay home. Most people with COVID-19 have mild illness and can recover at home without medical care. Do not leave your home, except to get medical care. Do not visit public areas and do not go to places where you are unable to wear a mask. Take care of yourself. Get rest and stay hydrated. Take over-the-counter medicines, such as acetaminophen, to help you feel better. Stay in touch with your doctor. Call before you get medical care. Be sure to get care if you have trouble breathing, or have any other emergency warning signs, or if you think it is an emergency. Avoid public transportation, ride-sharing, or taxis if possible. Get tested If you have symptoms of COVID-19, get tested. While waiting for test results, stay away from  others, including staying apart from those living in your household. Get tested as soon as possible after your symptoms start. Treatments may be available for people with COVID-19 who are at risk for becoming very sick. Don't delay: Treatment must be started early to be effective--some treatments must begin within 5 days of your first symptoms. Contact your healthcare provider right away if your test result is positive to determine if you are eligible. Self-tests are one of several options for testing for the virus that causes COVID-19 and may be more convenient than laboratory-based tests and point-of-care tests. Ask your healthcare provider or your local health department if you need help interpreting your test results. You can visit your state, tribal, local, and territorial health department's website to look for the latest local information on testing sites. Separate yourself from other people As much as possible, stay  in a specific room and away from other people and pets in your home. If possible, you should use a separate bathroom. If you need to be around other people or animals in or outside of the home, wear a well-fitting mask. Tell your close contacts that they may have been exposed to COVID-19. An infected person can spread COVID-19 starting 48 hours (or 2 days) before the person has any symptoms or tests positive. By letting your close contacts know they may have been exposed to COVID-19, you are helping to protect everyone. See COVID-19 and Animals if you have questions about pets. If you are diagnosed with COVID-19, someone from the health department may call you. Answer the call to slow the spread. Monitor your symptoms Symptoms of COVID-19 include fever, cough, or other symptoms. Follow care instructions from your healthcare provider and local health department. Your local health authorities may give instructions on checking your symptoms and reporting information. When to seek  emergency medical attention Look for emergency warning signs* for COVID-19. If someone is showing any of these signs, seek emergency medical care immediately: Trouble breathing Persistent pain or pressure in the chest New confusion Inability to wake or stay awake Pale, gray, or blue-colored skin, lips, or nail beds, depending on skin tone *This list is not all possible symptoms. Please call your medical provider for any other symptoms that are severe or concerning to you. Call 911 or call ahead to your local emergency facility: Notify the operator that you are seeking care for someone who has or may have COVID-19. Call ahead before visiting your doctor Call ahead. Many medical visits for routine care are being postponed or done by phone or telemedicine. If you have a medical appointment that cannot be postponed, call your doctor's office, and tell them you have or may have COVID-19. This will help the office protect themselves and other patients. If you are sick, wear a well-fitting mask You should wear a mask if you must be around other people or animals, including pets (even at home). Wear a mask with the best fit, protection, and comfort for you. You don't need to wear the mask if you are alone. If you can't put on a mask (because of trouble breathing, for example), cover your coughs and sneezes in some other way. Try to stay at least 6 feet away from other people. This will help protect the people around you. Masks should not be placed on young children under age 80 years, anyone who has trouble breathing, or anyone who is not able to remove the mask without help. Cover your coughs and sneezes Cover your mouth and nose with a tissue when you cough or sneeze. Throw away used tissues in a lined trash can. Immediately wash your hands with soap and water for at least 20 seconds. If soap and water are not available, clean your hands with an alcohol-based hand sanitizer that contains at least 60%  alcohol. Clean your hands often Wash your hands often with soap and water for at least 20 seconds. This is especially important after blowing your nose, coughing, or sneezing; going to the bathroom; and before eating or preparing food. Use hand sanitizer if soap and water are not available. Use an alcohol-based hand sanitizer with at least 60% alcohol, covering all surfaces of your hands and rubbing them together until they feel dry. Soap and water are the best option, especially if hands are visibly dirty. Avoid touching your eyes, nose, and mouth with unwashed  hands. Handwashing Tips Avoid sharing personal household items Do not share dishes, drinking glasses, cups, eating utensils, towels, or bedding with other people in your home. Wash these items thoroughly after using them with soap and water or put in the dishwasher. Clean surfaces in your home regularly Clean and disinfect high-touch surfaces (for example, doorknobs, tables, handles, light switches, and countertops) in your "sick room" and bathroom. In shared spaces, you should clean and disinfect surfaces and items after each use by the person who is ill. If you are sick and cannot clean, a caregiver or other person should only clean and disinfect the area around you (such as your bedroom and bathroom) on an as needed basis. Your caregiver/other person should wait as long as possible (at least several hours) and wear a mask before entering, cleaning, and disinfecting shared spaces that you use. Clean and disinfect areas that may have blood, stool, or body fluids on them. Use household cleaners and disinfectants. Clean visible dirty surfaces with household cleaners containing soap or detergent. Then, use a household disinfectant. Use a product from [employer] List N: Disinfectants for Coronavirus (IHWTU-88). Be sure to follow the instructions on the label to ensure safe and effective use of the product. Many products recommend keeping the surface  wet with a disinfectant for a certain period of time (look at "contact time" on the product label). You may also need to wear personal protective equipment, such as gloves, depending on the directions on the product label. Immediately after disinfecting, wash your hands with soap and water for 20 seconds. For completed guidance on cleaning and disinfecting your home, visit Complete Disinfection Guidance. Take steps to improve ventilation at home Improve ventilation (air flow) at home to help prevent from spreading COVID-19 to other people in your household. Clear out COVID-19 virus particles in the air by opening windows, using air filters, and turning on fans in your home. Use this interactive tool to learn how to improve air flow in your home. When you can be around others after being sick with COVID-19 Deciding when you can be around others is different for different situations. Find out when you can safely end home isolation. For any additional questions about your care, contact your healthcare provider or state or local health department. 04/01/2020 Content source: [facility] ([facility]), Division of Viral Diseases This information is not intended to replace advice given to you by your health care provider. Make sure you discuss any questions you have with your health care provider. Document Revised: 05/15/2020 Document Reviewed: 05/15/2020 Elsevier Patient Education  2022 [employer].      If you have been instructed to have an in-person evaluation today at a local Urgent Care facility, please use the link below. It will take you to a list of all of our available [facility], including address, phone number and hours of operation. Please do not delay care.  [facility]  If you or a family member do not have a primary care provider, use the link below to schedule a visit and establish care. When you choose a [facility] primary care physician or advanced practice provider, you gain a long-term partner in health. Find a Primary Care Provider  Learn more about [facility]'s in-office and virtual care options: [facility]

## 2021-11-18 NOTE — Telephone Encounter (Signed)
Patient was seen yesterday for a video visit with Dr Colin Benton and she said she got a positive test for covid today and wanted to know if something could be called in for that. Please advise. Call back is [phone]

## 2021-11-18 NOTE — Progress Notes (Signed)
Virtual Visit Consent   Colleen Schmitt, you are scheduled for a virtual visit with a [facility] provider today. Just as with appointments in the office, your consent must be obtained to participate. Your consent will be active for this visit and any virtual visit you may have with one of our providers in the next 365 days. If you have a MyChart account, a copy of this consent can be sent to you electronically.  As this is a virtual visit, video technology does not allow for your provider to perform a traditional examination. This may limit your provider's ability to fully assess your condition. If your provider identifies any concerns that need to be evaluated in person or the need to arrange testing (such as labs, EKG, etc.), we will make arrangements to do so. Although advances in technology are sophisticated, we cannot ensure that it will always work on either your end or our end. If the connection with a video visit is poor, the visit may have to be switched to a telephone visit. With either a video or telephone visit, we are not always able to ensure that we have a secure connection.  By engaging in this virtual visit, you consent to the provision of healthcare and authorize for your insurance to be billed (if applicable) for the services provided during this visit. Depending on your insurance coverage, you may receive a charge related to this service.  I need to obtain your verbal consent now. Are you willing to proceed with your visit today? [name] has provided verbal consent on 11/18/2021 for a virtual visit (video or telephone). Colleen Schmitt, [place]  Date: 11/18/2021 2:10 PM  Virtual Visit via Video Note   I, Colleen Schmitt, connected with  Colleen Schmitt  ([id number], [birth year]) on 11/18/21 at  2:00 PM EST by a video-enabled telemedicine application and verified that I am speaking with the correct person using two identifiers.  Location: Patient: Virtual Visit  Location Patient: Home Provider: Virtual Visit Location Provider: Home Office   I discussed the limitations of evaluation and management by telemedicine and the availability of in person appointments. The patient expressed understanding and agreed to proceed.    History of Present Illness: Colleen Schmitt is a 51 y.o. who identifies as a female who was assigned female at birth, and is being seen today for COVID-19. Symptoms starting 3.5 days ago with cough, congestion and fatigue. Tested for COVID and was negative at that time. Saw PCP office yesterday and diagnosed with viral URI, started on tessalon. Last night starting with fever, chest tightness and worsening sore throat. Retested x 2 and positive for COVID. Denies chest pain or SOB. Denies GI symptoms. Did start the [facility] sent in by her PCP.   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 (Take nirmatrelvir 150 mg two tablets twice daily for 5 days and ritonavir 100 mg one tablet twice daily for 5 days) Patient GFR is  > 60, Disp: 30 tablet, Rfl: 0   benzonatate  (TESSALON PERLES) 100 MG capsule, Take 1-2 capsules (100-200 mg total) by mouth up to 2 (two) times daily as needed for cough., Disp: 30 capsule, Rfl: 0   buPROPion (WELLBUTRIN XL) 300 MG 24 hr tablet, Take 1 tablet (300 mg total) by mouth daily., Disp: 30 tablet, Rfl: 5   Cholecalciferol (VITAMIN D3) 125 MCG (5000 UT) TABS, Take 1 tablet by mouth daily., Disp: , Rfl:    EPINEPHrine (EPIPEN 2-PAK) 0.3 mg/0.3 mL IJ SOAJ injection, Inject 0.3 mg into the muscle as needed for severe allergic reaction., Disp: 2 each, Rfl: 1   famotidine (PEPCID) 40 MG tablet, Take 1 tablet (40 mg total) by mouth daily for heartburn, Disp: 90 tablet, Rfl: 3   levocetirizine (XYZAL) 5 MG tablet, Take 1 tablet (5 mg total) by mouth every evening., Disp: 90 tablet, Rfl: 3   mometasone (NASONEX) 50 MCG/ACT nasal spray, Place 1-2 sprays into each nostril daily., Disp: 17 g, Rfl: 3   montelukast (SINGULAIR) 10 MG tablet, Take 1 tablet (10 mg total) by mouth at bedtime., Disp: 30 tablet, Rfl: 6   Multiple Vitamin (MULTIVITAMIN) tablet, Take by mouth., Disp: , Rfl:    Omega-3 Fatty Acids (OMEGA 3 500 PO), Take 1 capsule by mouth daily., Disp: , Rfl:    omeprazole (PRILOSEC) 40 MG capsule, Take 1 capsule (40 mg total) by mouth daily and 2 times per day when needed 30 minutes before meals., Disp: 145 capsule, Rfl: 1   phentermine 37.5 MG capsule, Take 1 capsule (37.5 mg total) by mouth every morning., Disp: 30 capsule, Rfl: 2   Probiotic Product (PROBIOTIC DAILY) CAPS, Take 1 capsule by mouth daily., Disp: , Rfl:    Zinc 30 MG CAPS, Take 1 capsule by mouth daily., Disp: , Rfl:   Observations/Objective: Patient is well-developed, well-nourished in no acute distress.  Resting comfortably at home.  Head is normocephalic, atraumatic.  No labored breathing. Speech is clear and coherent with logical content.  Patient is alert and oriented at baseline.   Assessment and Plan: 1. COVID-19 - nirmatrelvir/ritonavir EUA (PAXLOVID) 20  x 150 MG & 10 x '100MG'$  TABS; Take 3 tablets by mouth 2 (two) times daily for 5 days. (Take nirmatrelvir 150 mg two tablets twice daily for 5 days and ritonavir 100 mg one tablet twice daily for 5 days) Patient GFR is  > 60  Dispense: 30 tablet; Refill: 0 - albuterol (VENTOLIN HFA) 108 (90 Base) MCG/ACT inhaler; Inhale 2 puffs into the lungs every 6 (six) hours as needed for wheezing or shortness of breath.  Dispense: 8 g; Refill: 0  Patient with multiple risk factors for complicated course of illness. Discussed risks/benefits of antiviral medications including most common potential ADRs. Patient voiced understanding and would like to proceed with antiviral medication. They are candidate for Paxlovid. Rx sent to pharmacy. Supportive measures, OTC medications and vitamin regimen reviewed. Albuterol refilled. Patient has been enrolled in a MyChart COVID symptom monitoring program. Samule Dry reviewed in detail. Strict ER precautions discussed with patient.  Marland Kitchen  Follow Up Instructions: I discussed the assessment and treatment plan with the patient. The patient was provided an opportunity to ask questions and all were answered. The patient agreed with the plan and demonstrated an understanding of the instructions.  A copy of instructions were sent to the patient via MyChart unless otherwise noted below.   The patient was advised to call back or seek an in-person evaluation if the symptoms worsen or if the condition fails to improve as anticipated.  Time:  I spent 10 minutes with the patient via telehealth technology discussing the above problems/concerns.    Colleen Rio, PA-C

## 2021-11-18 NOTE — Telephone Encounter (Signed)
Spoke with patient.

## 2021-11-20 ENCOUNTER — Other Ambulatory Visit (HOSPITAL_COMMUNITY): Payer: Self-pay

## 2021-11-20 ENCOUNTER — Encounter (HOSPITAL_COMMUNITY): Payer: Self-pay

## 2021-11-25 ENCOUNTER — Other Ambulatory Visit (HOSPITAL_COMMUNITY): Payer: Self-pay

## 2021-11-27 ENCOUNTER — Other Ambulatory Visit (HOSPITAL_COMMUNITY): Payer: Self-pay

## 2021-11-27 ENCOUNTER — Other Ambulatory Visit: Payer: Self-pay | Admitting: Internal Medicine

## 2021-11-27 MED ORDER — LEVOCETIRIZINE DIHYDROCHLORIDE 5 MG PO TABS
5.0000 mg | ORAL_TABLET | Freq: Every evening | ORAL | 3 refills | Status: DC
  Filled 2021-11-27: qty 90, 90d supply, fill #0
  Filled 2022-03-10: qty 90, 90d supply, fill #1
  Filled 2022-04-17 – 2022-06-01 (×3): qty 90, 90d supply, fill #2
  Filled 2022-09-24: qty 90, 90d supply, fill #3

## 2021-11-27 MED ORDER — OMEPRAZOLE 40 MG PO CPDR
40.0000 mg | DELAYED_RELEASE_CAPSULE | Freq: Every day | ORAL | 1 refills | Status: DC
  Filled 2021-11-27: qty 145, 50d supply, fill #0
  Filled 2022-01-16: qty 145, 50d supply, fill #1

## 2021-11-27 MED ORDER — FAMOTIDINE 40 MG PO TABS
40.0000 mg | ORAL_TABLET | Freq: Every day | ORAL | 3 refills | Status: DC | PRN
  Filled 2021-11-27: qty 90, 90d supply, fill #0
  Filled 2022-04-17: qty 90, 90d supply, fill #1
  Filled 2022-04-28 – 2022-10-14 (×3): qty 90, 90d supply, fill #2

## 2021-11-30 ENCOUNTER — Other Ambulatory Visit (HOSPITAL_COMMUNITY): Payer: Self-pay

## 2021-11-30 DIAGNOSIS — F32A Depression, unspecified: Secondary | ICD-10-CM | POA: Diagnosis not present

## 2021-12-01 DIAGNOSIS — J3081 Allergic rhinitis due to animal (cat) (dog) hair and dander: Secondary | ICD-10-CM | POA: Diagnosis not present

## 2021-12-01 DIAGNOSIS — J3089 Other allergic rhinitis: Secondary | ICD-10-CM | POA: Diagnosis not present

## 2021-12-01 DIAGNOSIS — J301 Allergic rhinitis due to pollen: Secondary | ICD-10-CM | POA: Diagnosis not present

## 2021-12-02 ENCOUNTER — Encounter: Payer: Self-pay | Admitting: Internal Medicine

## 2021-12-02 ENCOUNTER — Other Ambulatory Visit: Payer: Self-pay | Admitting: Internal Medicine

## 2021-12-02 ENCOUNTER — Other Ambulatory Visit (HOSPITAL_COMMUNITY): Payer: Self-pay

## 2021-12-04 ENCOUNTER — Other Ambulatory Visit (HOSPITAL_COMMUNITY): Payer: Self-pay

## 2021-12-04 MED ORDER — WEGOVY 1 MG/0.5ML ~~LOC~~ SOAJ
1.0000 mg | SUBCUTANEOUS | 0 refills | Status: DC
  Filled 2021-12-04: qty 2, 28d supply, fill #0

## 2021-12-04 NOTE — Addendum Note (Signed)
Addended by: Binnie Rail on: 12/04/2021 12:09 PM   Modules accepted: Orders

## 2021-12-07 ENCOUNTER — Encounter: Payer: Self-pay | Admitting: Internal Medicine

## 2021-12-07 ENCOUNTER — Other Ambulatory Visit (HOSPITAL_COMMUNITY): Payer: Self-pay

## 2021-12-07 NOTE — Progress Notes (Unsigned)
    Subjective:    Patient ID: Colleen Schmitt, female    DOB: Dec 11, 1970, 51 y.o.   MRN: [id number]      HPI Colleen Schmitt is here for No chief complaint on file.   Had covid 3 weeks ago - persistent cough.  Has asthma - on albuterol prn, xyzal, singulair, nasonex, tessalon perles.        Medications and allergies reviewed with patient and updated if appropriate.  Current Outpatient Medications on File Prior to Visit  Medication Sig Dispense Refill   albuterol (VENTOLIN HFA) 108 (90 Base) MCG/ACT inhaler Inhale 2 puffs into the lungs every 6 (six) hours as needed for wheezing or shortness of breath. 6.7 g 0   benzonatate (TESSALON PERLES) 100 MG capsule Take 1-2 capsules (100-200 mg total) by mouth up to 2 (two) times daily as needed for cough. 30 capsule 0   buPROPion (WELLBUTRIN XL) 300 MG 24 hr tablet Take 1 tablet (300 mg total) by mouth daily. 30 tablet 5   Cholecalciferol (VITAMIN D3) 125 MCG (5000 UT) TABS Take 1 tablet by mouth daily.     EPINEPHrine (EPIPEN 2-PAK) 0.3 mg/0.3 mL IJ SOAJ injection Inject 0.3 mg into the muscle as needed for severe allergic reaction. 2 each 1   famotidine (PEPCID) 40 MG tablet Take 1 tablet (40 mg total) by mouth daily for heartburn 90 tablet 3   levocetirizine (XYZAL) 5 MG tablet Take 1 tablet (5 mg total) by mouth every evening. 90 tablet 3   mometasone (NASONEX) 50 MCG/ACT nasal spray Place 1-2 sprays into each nostril daily. 17 g 3   montelukast (SINGULAIR) 10 MG tablet Take 1 tablet (10 mg total) by mouth at bedtime. 30 tablet 6   Multiple Vitamin (MULTIVITAMIN) tablet Take by mouth.     Omega-3 Fatty Acids (OMEGA 3 500 PO) Take 1 capsule by mouth daily.     omeprazole (PRILOSEC) 40 MG capsule Take 1 capsule (40 mg total) by mouth daily and 2 times per day when needed 30 minutes before meals. 145 capsule 1   phentermine 37.5 MG capsule Take 1 capsule (37.5 mg total) by mouth every morning. 30 capsule 2   Probiotic Product (PROBIOTIC DAILY)  CAPS Take 1 capsule by mouth daily.     Semaglutide-Weight Management (WEGOVY) 1 MG/0.5ML SOAJ Inject 1 mg into the skin once a week. 2 mL 0   Zinc 30 MG CAPS Take 1 capsule by mouth daily.     No current facility-administered medications on file prior to visit.    Review of Systems     Objective:  There were no vitals filed for this visit. BP Readings from Last 3 Encounters:  10/12/21 108/78  02/22/18 106/77  09/24/16 (!) 117/92   Wt Readings from Last 3 Encounters:  11/17/21 185 lb (83.9 kg)  10/12/21 204 lb (92.5 kg)  02/22/18 179 lb (81.2 kg)   There is no height or weight on file to calculate BMI.    Physical Exam         Assessment & Plan:    See Problem List for Assessment and Plan of chronic medical problems.

## 2021-12-08 ENCOUNTER — Ambulatory Visit (INDEPENDENT_AMBULATORY_CARE_PROVIDER_SITE_OTHER): Payer: 59

## 2021-12-08 ENCOUNTER — Ambulatory Visit (INDEPENDENT_AMBULATORY_CARE_PROVIDER_SITE_OTHER): Payer: 59 | Admitting: Internal Medicine

## 2021-12-08 VITALS — BP 108/74 | HR 95 | Temp 98.4°F | Ht 68.5 in | Wt 202.0 lb

## 2021-12-08 DIAGNOSIS — R059 Cough, unspecified: Secondary | ICD-10-CM | POA: Diagnosis not present

## 2021-12-08 DIAGNOSIS — J45901 Unspecified asthma with (acute) exacerbation: Secondary | ICD-10-CM | POA: Insufficient documentation

## 2021-12-08 DIAGNOSIS — J45909 Unspecified asthma, uncomplicated: Secondary | ICD-10-CM | POA: Diagnosis not present

## 2021-12-08 MED ORDER — METHYLPREDNISOLONE ACETATE 80 MG/ML IJ SUSP
80.0000 mg | Freq: Once | INTRAMUSCULAR | Status: AC
  Administered 2021-12-08: 80 mg via INTRAMUSCULAR

## 2021-12-08 NOTE — Patient Instructions (Addendum)
     Have a chest xray downstairs.    You received a Depo-medrol injection today   Medications changes include :   none     Return if symptoms worsen or fail to improve.

## 2021-12-08 NOTE — Assessment & Plan Note (Addendum)
Acute Asthma exacerbation-likely related to COVID that she had 3 weeks ago-?  Allergies contributing Continue albuterol as needed, Xyzal 5 mg nightly, Singulair 10 mg nightly Depo-Medrol 80 mg IM x 1-does not typically tolerate oral prednisone Chest x-ray to make sure there is no evidence of pneumonia Typically effective-she will let me know if her symptoms do not improve No evidence of infection so no need for an antibiotic

## 2021-12-09 ENCOUNTER — Other Ambulatory Visit (HOSPITAL_COMMUNITY): Payer: Self-pay

## 2021-12-11 ENCOUNTER — Other Ambulatory Visit (HOSPITAL_COMMUNITY): Payer: Self-pay

## 2021-12-18 DIAGNOSIS — J3089 Other allergic rhinitis: Secondary | ICD-10-CM | POA: Diagnosis not present

## 2021-12-18 DIAGNOSIS — J3081 Allergic rhinitis due to animal (cat) (dog) hair and dander: Secondary | ICD-10-CM | POA: Diagnosis not present

## 2021-12-18 DIAGNOSIS — J301 Allergic rhinitis due to pollen: Secondary | ICD-10-CM | POA: Diagnosis not present

## 2021-12-25 DIAGNOSIS — J3081 Allergic rhinitis due to animal (cat) (dog) hair and dander: Secondary | ICD-10-CM | POA: Diagnosis not present

## 2021-12-25 DIAGNOSIS — J301 Allergic rhinitis due to pollen: Secondary | ICD-10-CM | POA: Diagnosis not present

## 2021-12-25 DIAGNOSIS — J3089 Other allergic rhinitis: Secondary | ICD-10-CM | POA: Diagnosis not present

## 2022-01-01 DIAGNOSIS — F32A Depression, unspecified: Secondary | ICD-10-CM | POA: Diagnosis not present

## 2022-01-05 ENCOUNTER — Other Ambulatory Visit (HOSPITAL_COMMUNITY): Payer: Self-pay

## 2022-01-05 ENCOUNTER — Other Ambulatory Visit: Payer: Self-pay | Admitting: Internal Medicine

## 2022-01-05 MED ORDER — WEGOVY 1 MG/0.5ML ~~LOC~~ SOAJ
1.0000 mg | SUBCUTANEOUS | 0 refills | Status: DC
  Filled 2022-01-05 – 2022-01-16 (×2): qty 2, 28d supply, fill #0

## 2022-01-08 ENCOUNTER — Other Ambulatory Visit (HOSPITAL_COMMUNITY): Payer: Self-pay

## 2022-01-15 DIAGNOSIS — J3089 Other allergic rhinitis: Secondary | ICD-10-CM | POA: Diagnosis not present

## 2022-01-15 DIAGNOSIS — J3081 Allergic rhinitis due to animal (cat) (dog) hair and dander: Secondary | ICD-10-CM | POA: Diagnosis not present

## 2022-01-15 DIAGNOSIS — H1045 Other chronic allergic conjunctivitis: Secondary | ICD-10-CM | POA: Diagnosis not present

## 2022-01-15 DIAGNOSIS — J301 Allergic rhinitis due to pollen: Secondary | ICD-10-CM | POA: Diagnosis not present

## 2022-01-15 DIAGNOSIS — J4520 Mild intermittent asthma, uncomplicated: Secondary | ICD-10-CM | POA: Diagnosis not present

## 2022-01-16 ENCOUNTER — Other Ambulatory Visit (HOSPITAL_COMMUNITY): Payer: Self-pay

## 2022-01-18 ENCOUNTER — Other Ambulatory Visit: Payer: Self-pay

## 2022-01-18 ENCOUNTER — Other Ambulatory Visit (HOSPITAL_COMMUNITY): Payer: Self-pay

## 2022-01-19 ENCOUNTER — Other Ambulatory Visit: Payer: Self-pay

## 2022-01-19 ENCOUNTER — Other Ambulatory Visit (HOSPITAL_COMMUNITY): Payer: Self-pay

## 2022-01-27 IMAGING — RF DG ESOPHAGUS
3 series · 14 of 19 positions shown · non-contrast
Comparison: None.

CLINICAL DATA: Early satiety. Food sticking in chest. Previous
gastric lap band placement more than 10 years ago.

EXAM:
ESOPHOGRAM/BARIUM SWALLOW
TECHNIQUE: Single contrast examination was performed using  thin barium.
FLUOROSCOPY TIME:  Fluoroscopy Time:  2 minutes 36 seconds
Radiation Exposure Index (if provided by the fluoroscopic device):
17.8 mGy
Number of Acquired Spot Images: 0

[Series 1: sequence · 3 of 26 frames shown (1 of 2)]
[frame 4/26]
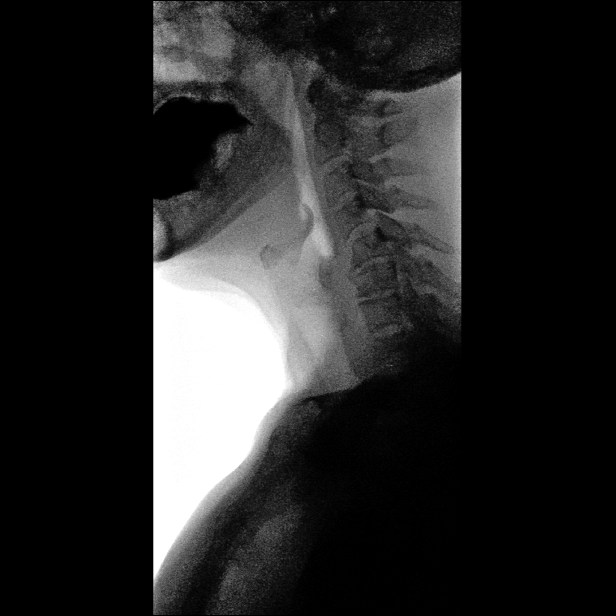
[frame 14/26]
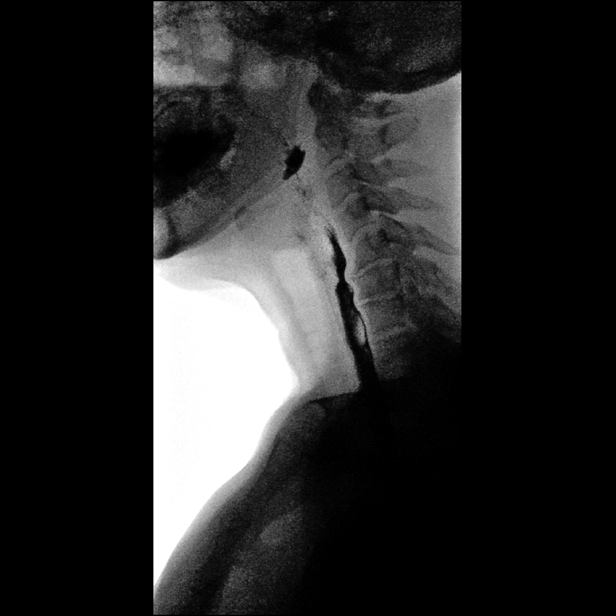
[frame 23/26]
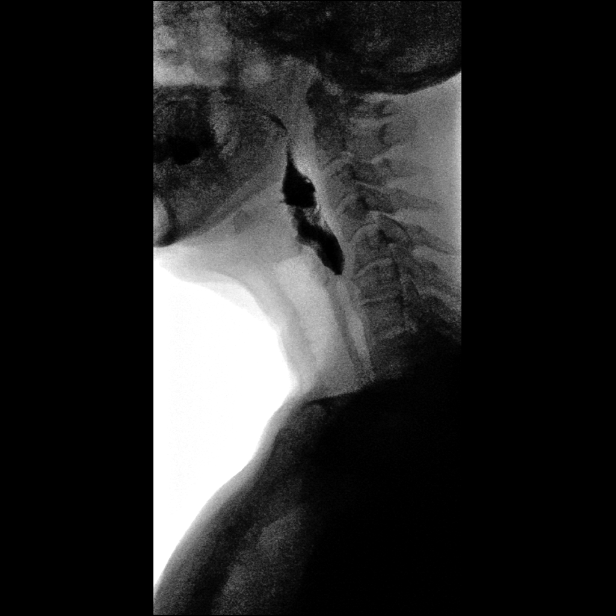

[Series 2: sequence · 2 of 17 frames shown (2 of 2)]
[frame 3/17]
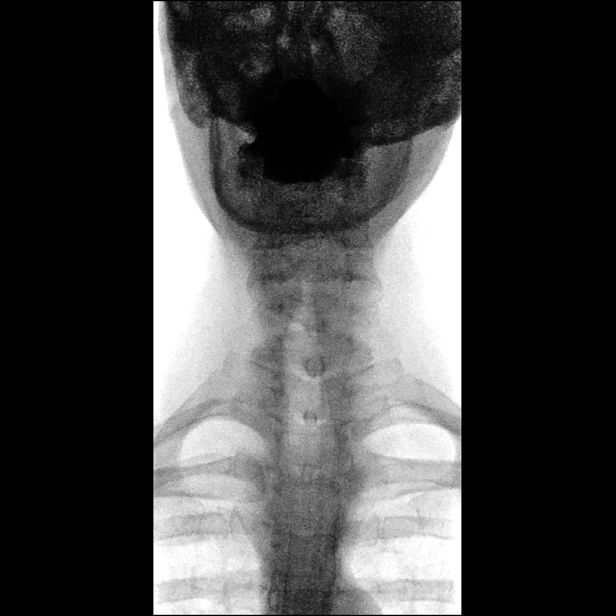
[frame 15/17]
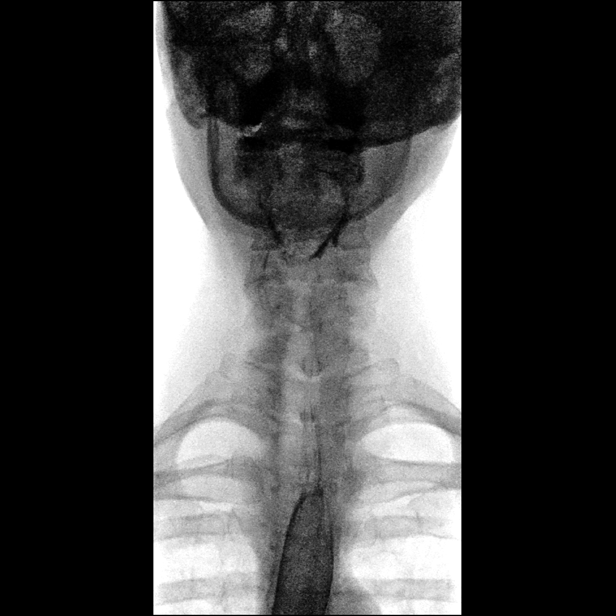

[Series 3: one shot · 9 of 12 slices shown]
[im 1/12]
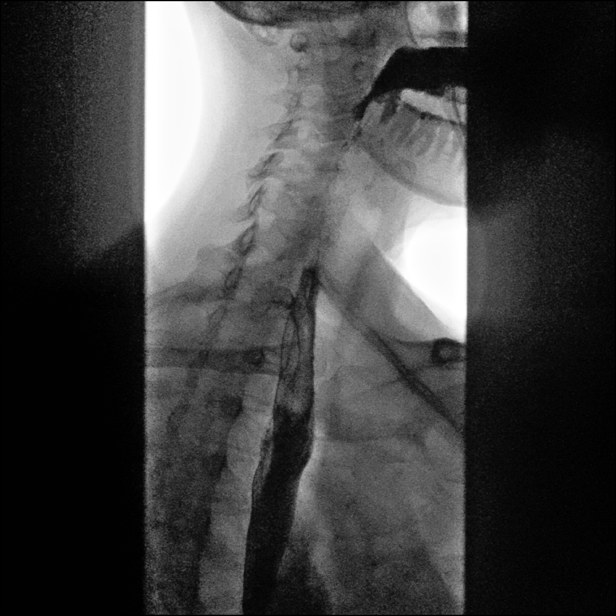
[im 2/12]
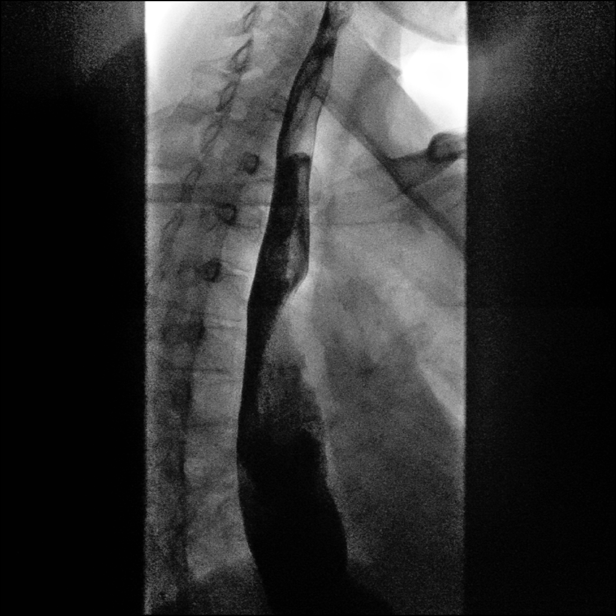
[im 4/12]
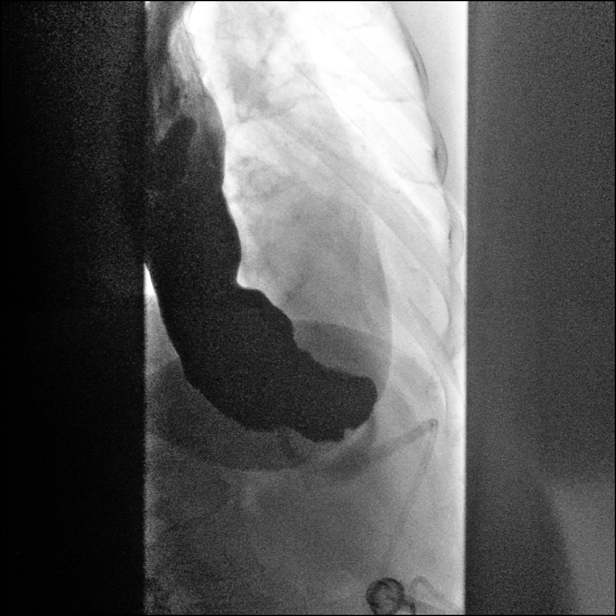
[im 5/12]
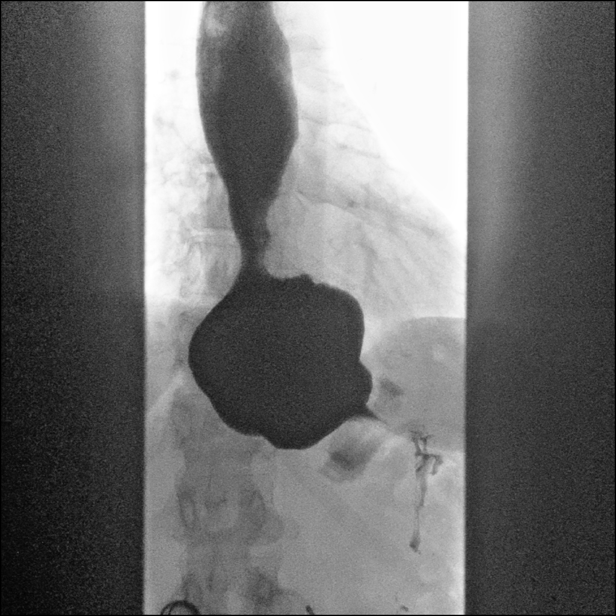
[im 6/12]
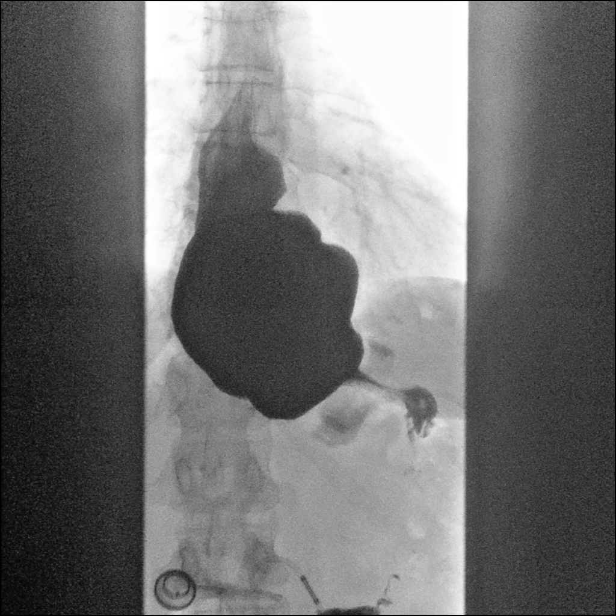
[im 8/12]
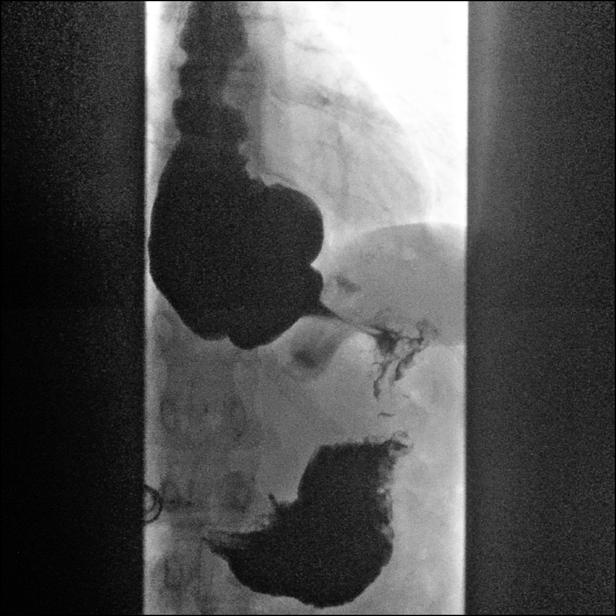
[im 9/12]
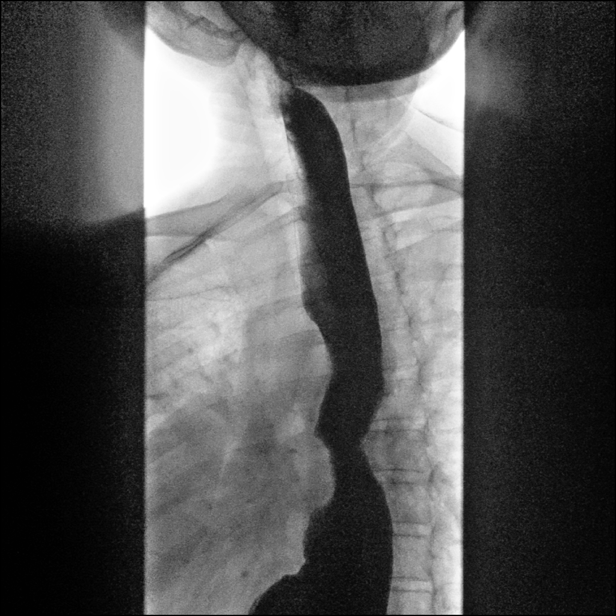
[im 10/12]
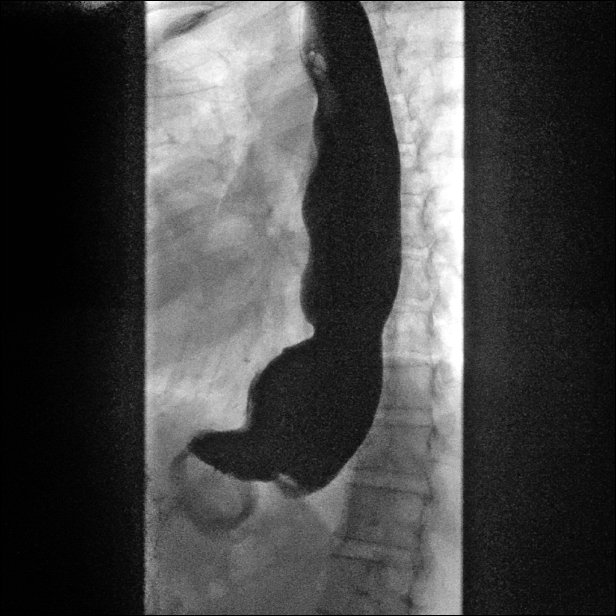
[im 12/12]
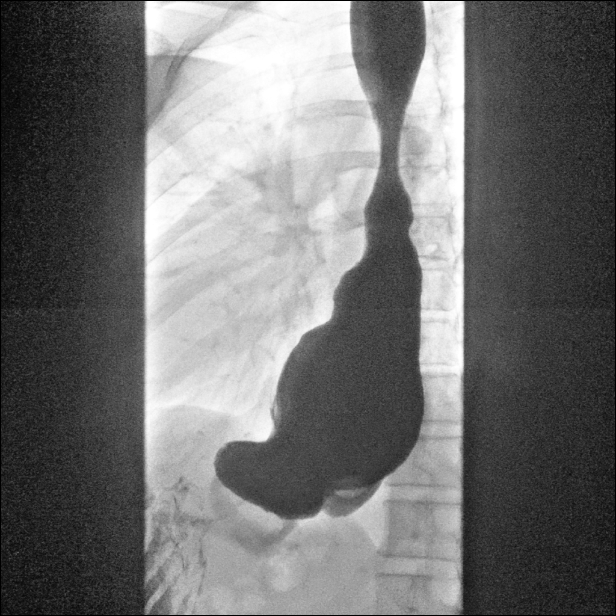

[14 of 19 positions shown; findings below may reference images not displayed]

FINDINGS: No evidence of vestibular penetration or aspiration during
swallowing. Pharynx and cervical esophagus are unremarkable.

Gastric lap band is seen in appropriate position. Severe narrowing
is seen at the site of the lap band in the proximal stomach, with
only a thin stream of barium passing into the stomach. This results
in significant retention of contrast within the esophagus. Moderate
to severe dilatation of the thoracic esophagus is seen, particularly
involving the distal thoracic esophagus. No definite hiatal hernia
is noted. Moderate to severe esophageal dysmotility is seen, with
lack of primary peristaltic wave in the dilated mid and distal
thoracic esophagus.
IMPRESSION: Gastric lap band in appropriate position.

Severe narrowing at the site of the lap band, with moderate to
severe dilatation of the thoracic esophagus with retention of
contrast. No definite hiatal hernia.

Moderate to severe esophageal dysmotility.

## 2022-01-28 DIAGNOSIS — J3089 Other allergic rhinitis: Secondary | ICD-10-CM | POA: Diagnosis not present

## 2022-01-28 DIAGNOSIS — J3081 Allergic rhinitis due to animal (cat) (dog) hair and dander: Secondary | ICD-10-CM | POA: Diagnosis not present

## 2022-01-28 DIAGNOSIS — J301 Allergic rhinitis due to pollen: Secondary | ICD-10-CM | POA: Diagnosis not present

## 2022-02-04 ENCOUNTER — Other Ambulatory Visit (HOSPITAL_COMMUNITY): Payer: Self-pay

## 2022-02-04 ENCOUNTER — Other Ambulatory Visit: Payer: Self-pay

## 2022-02-04 ENCOUNTER — Other Ambulatory Visit: Payer: Self-pay | Admitting: Internal Medicine

## 2022-02-04 MED ORDER — WEGOVY 1 MG/0.5ML ~~LOC~~ SOAJ
1.0000 mg | SUBCUTANEOUS | 0 refills | Status: DC
  Filled 2022-02-04 – 2022-02-15 (×2): qty 2, 28d supply, fill #0

## 2022-02-05 ENCOUNTER — Other Ambulatory Visit (HOSPITAL_COMMUNITY): Payer: Self-pay

## 2022-02-11 DIAGNOSIS — Z01419 Encounter for gynecological examination (general) (routine) without abnormal findings: Secondary | ICD-10-CM | POA: Diagnosis not present

## 2022-02-11 DIAGNOSIS — Z1231 Encounter for screening mammogram for malignant neoplasm of breast: Secondary | ICD-10-CM | POA: Diagnosis not present

## 2022-02-11 DIAGNOSIS — R5383 Other fatigue: Secondary | ICD-10-CM | POA: Diagnosis not present

## 2022-02-11 DIAGNOSIS — Z6830 Body mass index (BMI) 30.0-30.9, adult: Secondary | ICD-10-CM | POA: Diagnosis not present

## 2022-02-11 DIAGNOSIS — J301 Allergic rhinitis due to pollen: Secondary | ICD-10-CM | POA: Diagnosis not present

## 2022-02-11 DIAGNOSIS — J3089 Other allergic rhinitis: Secondary | ICD-10-CM | POA: Diagnosis not present

## 2022-02-11 DIAGNOSIS — J3081 Allergic rhinitis due to animal (cat) (dog) hair and dander: Secondary | ICD-10-CM | POA: Diagnosis not present

## 2022-02-15 ENCOUNTER — Other Ambulatory Visit (HOSPITAL_COMMUNITY): Payer: Self-pay

## 2022-02-25 DIAGNOSIS — J3089 Other allergic rhinitis: Secondary | ICD-10-CM | POA: Diagnosis not present

## 2022-02-25 DIAGNOSIS — J3081 Allergic rhinitis due to animal (cat) (dog) hair and dander: Secondary | ICD-10-CM | POA: Diagnosis not present

## 2022-02-25 DIAGNOSIS — J301 Allergic rhinitis due to pollen: Secondary | ICD-10-CM | POA: Diagnosis not present

## 2022-03-02 ENCOUNTER — Other Ambulatory Visit (HOSPITAL_COMMUNITY): Payer: Self-pay

## 2022-03-10 ENCOUNTER — Other Ambulatory Visit: Payer: Self-pay | Admitting: Internal Medicine

## 2022-03-10 ENCOUNTER — Encounter: Payer: Self-pay | Admitting: Internal Medicine

## 2022-03-10 ENCOUNTER — Other Ambulatory Visit (HOSPITAL_COMMUNITY): Payer: Self-pay

## 2022-03-10 ENCOUNTER — Other Ambulatory Visit: Payer: Self-pay

## 2022-03-10 MED ORDER — SEMAGLUTIDE (2 MG/DOSE) 8 MG/3ML ~~LOC~~ SOPN
2.0000 mg | PEN_INJECTOR | SUBCUTANEOUS | 0 refills | Status: DC
  Filled 2022-03-10: qty 3, 28d supply, fill #0

## 2022-03-10 MED ORDER — OMEPRAZOLE 40 MG PO CPDR
40.0000 mg | DELAYED_RELEASE_CAPSULE | Freq: Every day | ORAL | 0 refills | Status: DC
  Filled 2022-03-10: qty 60, 30d supply, fill #0

## 2022-03-11 ENCOUNTER — Other Ambulatory Visit (HOSPITAL_COMMUNITY): Payer: Self-pay

## 2022-03-11 MED ORDER — WEGOVY 1.7 MG/0.75ML ~~LOC~~ SOAJ
1.7000 mg | SUBCUTANEOUS | 0 refills | Status: DC
  Filled 2022-03-11 – 2022-04-05 (×3): qty 3, 28d supply, fill #0
  Filled 2022-04-17 – 2022-04-28 (×4): qty 3, 28d supply, fill #1
  Filled 2022-07-21: qty 3, 28d supply, fill #2

## 2022-03-11 NOTE — Addendum Note (Signed)
Addended by: Binnie Rail on: 03/11/2022 09:21 PM   Modules accepted: Orders

## 2022-03-12 ENCOUNTER — Other Ambulatory Visit (HOSPITAL_COMMUNITY): Payer: Self-pay

## 2022-03-12 ENCOUNTER — Telehealth: Payer: Self-pay

## 2022-03-12 ENCOUNTER — Other Ambulatory Visit: Payer: Self-pay | Admitting: Internal Medicine

## 2022-03-12 MED ORDER — WEGOVY 1 MG/0.5ML ~~LOC~~ SOAJ
1.0000 mg | SUBCUTANEOUS | 0 refills | Status: DC
  Filled 2022-03-12: qty 2, 28d supply, fill #0

## 2022-03-12 NOTE — Telephone Encounter (Signed)
Colleen Schmitt (Key: [id number])  Colleen Schmitt 1.'7MG'$ /0.75ML auto-injectors  Form:MedImpact ePA Form 2017 NCPDP  Submit Clinical Questions:  Determination message from Plan:  This drug/product is not covered under the pharmacy benefit. Prior Authorization is not available.

## 2022-03-17 DIAGNOSIS — J3081 Allergic rhinitis due to animal (cat) (dog) hair and dander: Secondary | ICD-10-CM | POA: Diagnosis not present

## 2022-03-17 DIAGNOSIS — J301 Allergic rhinitis due to pollen: Secondary | ICD-10-CM | POA: Diagnosis not present

## 2022-03-17 DIAGNOSIS — J3089 Other allergic rhinitis: Secondary | ICD-10-CM | POA: Diagnosis not present

## 2022-03-31 ENCOUNTER — Other Ambulatory Visit (HOSPITAL_COMMUNITY): Payer: Self-pay

## 2022-04-05 ENCOUNTER — Other Ambulatory Visit (HOSPITAL_COMMUNITY): Payer: Self-pay

## 2022-04-05 DIAGNOSIS — J3089 Other allergic rhinitis: Secondary | ICD-10-CM | POA: Diagnosis not present

## 2022-04-05 DIAGNOSIS — J301 Allergic rhinitis due to pollen: Secondary | ICD-10-CM | POA: Diagnosis not present

## 2022-04-05 DIAGNOSIS — F32A Depression, unspecified: Secondary | ICD-10-CM | POA: Diagnosis not present

## 2022-04-05 DIAGNOSIS — J3081 Allergic rhinitis due to animal (cat) (dog) hair and dander: Secondary | ICD-10-CM | POA: Diagnosis not present

## 2022-04-17 ENCOUNTER — Other Ambulatory Visit (HOSPITAL_COMMUNITY): Payer: Self-pay

## 2022-04-19 ENCOUNTER — Other Ambulatory Visit (HOSPITAL_COMMUNITY): Payer: Self-pay

## 2022-04-19 ENCOUNTER — Other Ambulatory Visit: Payer: Self-pay

## 2022-04-19 MED ORDER — MONTELUKAST SODIUM 10 MG PO TABS
10.0000 mg | ORAL_TABLET | Freq: Every day | ORAL | 6 refills | Status: DC
  Filled 2022-04-19: qty 30, 30d supply, fill #0
  Filled 2022-06-01: qty 30, 30d supply, fill #1
  Filled 2022-07-06: qty 30, 30d supply, fill #2
  Filled 2022-08-11: qty 90, 90d supply, fill #3
  Filled 2022-08-26 – 2022-11-15 (×2): qty 30, 30d supply, fill #4

## 2022-04-20 ENCOUNTER — Other Ambulatory Visit (HOSPITAL_COMMUNITY): Payer: Self-pay

## 2022-04-20 ENCOUNTER — Encounter (HOSPITAL_COMMUNITY): Payer: Self-pay

## 2022-04-21 ENCOUNTER — Other Ambulatory Visit (HOSPITAL_COMMUNITY): Payer: Self-pay

## 2022-04-22 ENCOUNTER — Other Ambulatory Visit (HOSPITAL_COMMUNITY): Payer: Self-pay

## 2022-04-23 DIAGNOSIS — J3089 Other allergic rhinitis: Secondary | ICD-10-CM | POA: Diagnosis not present

## 2022-04-23 DIAGNOSIS — J301 Allergic rhinitis due to pollen: Secondary | ICD-10-CM | POA: Diagnosis not present

## 2022-04-23 DIAGNOSIS — J3081 Allergic rhinitis due to animal (cat) (dog) hair and dander: Secondary | ICD-10-CM | POA: Diagnosis not present

## 2022-04-24 ENCOUNTER — Other Ambulatory Visit (HOSPITAL_COMMUNITY): Payer: Self-pay

## 2022-04-28 ENCOUNTER — Other Ambulatory Visit: Payer: Self-pay | Admitting: Internal Medicine

## 2022-04-28 ENCOUNTER — Other Ambulatory Visit (HOSPITAL_COMMUNITY): Payer: Self-pay

## 2022-04-28 ENCOUNTER — Other Ambulatory Visit: Payer: Self-pay

## 2022-04-28 MED ORDER — OMEPRAZOLE 40 MG PO CPDR
40.0000 mg | DELAYED_RELEASE_CAPSULE | Freq: Every day | ORAL | 2 refills | Status: DC
  Filled 2022-04-28: qty 60, 30d supply, fill #0
  Filled 2022-07-06: qty 60, 30d supply, fill #1
  Filled 2022-10-14: qty 60, 30d supply, fill #2

## 2022-05-03 DIAGNOSIS — F32A Depression, unspecified: Secondary | ICD-10-CM | POA: Diagnosis not present

## 2022-05-05 DIAGNOSIS — J3081 Allergic rhinitis due to animal (cat) (dog) hair and dander: Secondary | ICD-10-CM | POA: Diagnosis not present

## 2022-05-05 DIAGNOSIS — J3089 Other allergic rhinitis: Secondary | ICD-10-CM | POA: Diagnosis not present

## 2022-05-05 DIAGNOSIS — J301 Allergic rhinitis due to pollen: Secondary | ICD-10-CM | POA: Diagnosis not present

## 2022-05-10 ENCOUNTER — Other Ambulatory Visit (HOSPITAL_COMMUNITY): Payer: Self-pay

## 2022-05-12 DIAGNOSIS — H9313 Tinnitus, bilateral: Secondary | ICD-10-CM | POA: Diagnosis not present

## 2022-05-12 DIAGNOSIS — H903 Sensorineural hearing loss, bilateral: Secondary | ICD-10-CM | POA: Diagnosis not present

## 2022-05-13 ENCOUNTER — Other Ambulatory Visit (HOSPITAL_COMMUNITY): Payer: Self-pay

## 2022-05-13 DIAGNOSIS — Z1211 Encounter for screening for malignant neoplasm of colon: Secondary | ICD-10-CM | POA: Diagnosis not present

## 2022-05-13 DIAGNOSIS — K219 Gastro-esophageal reflux disease without esophagitis: Secondary | ICD-10-CM | POA: Diagnosis not present

## 2022-05-13 DIAGNOSIS — K449 Diaphragmatic hernia without obstruction or gangrene: Secondary | ICD-10-CM | POA: Diagnosis not present

## 2022-05-13 DIAGNOSIS — E669 Obesity, unspecified: Secondary | ICD-10-CM | POA: Diagnosis not present

## 2022-05-13 DIAGNOSIS — Z86010 Personal history of colonic polyps: Secondary | ICD-10-CM | POA: Diagnosis not present

## 2022-05-13 DIAGNOSIS — J301 Allergic rhinitis due to pollen: Secondary | ICD-10-CM | POA: Diagnosis not present

## 2022-05-13 DIAGNOSIS — K5730 Diverticulosis of large intestine without perforation or abscess without bleeding: Secondary | ICD-10-CM | POA: Diagnosis not present

## 2022-05-14 ENCOUNTER — Other Ambulatory Visit (HOSPITAL_COMMUNITY): Payer: Self-pay

## 2022-05-14 DIAGNOSIS — J3089 Other allergic rhinitis: Secondary | ICD-10-CM | POA: Diagnosis not present

## 2022-05-14 DIAGNOSIS — J3081 Allergic rhinitis due to animal (cat) (dog) hair and dander: Secondary | ICD-10-CM | POA: Diagnosis not present

## 2022-05-17 ENCOUNTER — Other Ambulatory Visit (HOSPITAL_COMMUNITY): Payer: Self-pay

## 2022-05-19 ENCOUNTER — Other Ambulatory Visit: Payer: Self-pay | Admitting: Internal Medicine

## 2022-05-19 ENCOUNTER — Other Ambulatory Visit: Payer: Self-pay

## 2022-05-19 ENCOUNTER — Other Ambulatory Visit (HOSPITAL_COMMUNITY): Payer: Self-pay

## 2022-05-19 MED ORDER — BUPROPION HCL ER (XL) 300 MG PO TB24
300.0000 mg | ORAL_TABLET | Freq: Every day | ORAL | 5 refills | Status: DC
  Filled 2022-05-19: qty 30, 30d supply, fill #0
  Filled 2022-06-01 – 2022-07-06 (×2): qty 30, 30d supply, fill #1
  Filled 2022-08-11: qty 90, 90d supply, fill #2
  Filled 2022-08-24 – 2022-11-15 (×2): qty 30, 30d supply, fill #3

## 2022-05-21 ENCOUNTER — Other Ambulatory Visit (HOSPITAL_COMMUNITY): Payer: Self-pay

## 2022-05-21 DIAGNOSIS — J3089 Other allergic rhinitis: Secondary | ICD-10-CM | POA: Diagnosis not present

## 2022-05-21 DIAGNOSIS — J301 Allergic rhinitis due to pollen: Secondary | ICD-10-CM | POA: Diagnosis not present

## 2022-05-21 DIAGNOSIS — J3081 Allergic rhinitis due to animal (cat) (dog) hair and dander: Secondary | ICD-10-CM | POA: Diagnosis not present

## 2022-05-25 ENCOUNTER — Other Ambulatory Visit (HOSPITAL_COMMUNITY): Payer: Self-pay

## 2022-05-31 DIAGNOSIS — F32A Depression, unspecified: Secondary | ICD-10-CM | POA: Diagnosis not present

## 2022-06-01 ENCOUNTER — Other Ambulatory Visit: Payer: Self-pay

## 2022-06-02 ENCOUNTER — Other Ambulatory Visit: Payer: Self-pay

## 2022-06-08 ENCOUNTER — Other Ambulatory Visit (HOSPITAL_COMMUNITY): Payer: Self-pay

## 2022-06-08 DIAGNOSIS — J3089 Other allergic rhinitis: Secondary | ICD-10-CM | POA: Diagnosis not present

## 2022-06-08 DIAGNOSIS — J3081 Allergic rhinitis due to animal (cat) (dog) hair and dander: Secondary | ICD-10-CM | POA: Diagnosis not present

## 2022-06-08 DIAGNOSIS — J301 Allergic rhinitis due to pollen: Secondary | ICD-10-CM | POA: Diagnosis not present

## 2022-06-10 ENCOUNTER — Other Ambulatory Visit (HOSPITAL_COMMUNITY): Payer: Self-pay

## 2022-06-23 ENCOUNTER — Other Ambulatory Visit (HOSPITAL_COMMUNITY): Payer: Self-pay

## 2022-06-28 ENCOUNTER — Other Ambulatory Visit (HOSPITAL_COMMUNITY): Payer: Self-pay

## 2022-07-01 ENCOUNTER — Other Ambulatory Visit (HOSPITAL_COMMUNITY): Payer: Self-pay

## 2022-07-01 DIAGNOSIS — J3089 Other allergic rhinitis: Secondary | ICD-10-CM | POA: Diagnosis not present

## 2022-07-01 DIAGNOSIS — J301 Allergic rhinitis due to pollen: Secondary | ICD-10-CM | POA: Diagnosis not present

## 2022-07-01 DIAGNOSIS — J3081 Allergic rhinitis due to animal (cat) (dog) hair and dander: Secondary | ICD-10-CM | POA: Diagnosis not present

## 2022-07-06 ENCOUNTER — Other Ambulatory Visit (HOSPITAL_COMMUNITY): Payer: Self-pay

## 2022-07-06 MED ORDER — GOLYTELY 236 G PO SOLR
ORAL | 0 refills | Status: DC
  Filled 2022-07-06 – 2022-07-21 (×2): qty 4000, 1d supply, fill #0

## 2022-07-07 ENCOUNTER — Other Ambulatory Visit (HOSPITAL_COMMUNITY): Payer: Self-pay

## 2022-07-09 DIAGNOSIS — J3081 Allergic rhinitis due to animal (cat) (dog) hair and dander: Secondary | ICD-10-CM | POA: Diagnosis not present

## 2022-07-09 DIAGNOSIS — J3089 Other allergic rhinitis: Secondary | ICD-10-CM | POA: Diagnosis not present

## 2022-07-09 DIAGNOSIS — J301 Allergic rhinitis due to pollen: Secondary | ICD-10-CM | POA: Diagnosis not present

## 2022-07-13 ENCOUNTER — Other Ambulatory Visit (HOSPITAL_COMMUNITY): Payer: Self-pay

## 2022-07-14 ENCOUNTER — Other Ambulatory Visit (HOSPITAL_COMMUNITY): Payer: Self-pay

## 2022-07-14 ENCOUNTER — Other Ambulatory Visit: Payer: Self-pay | Admitting: Internal Medicine

## 2022-07-14 ENCOUNTER — Other Ambulatory Visit: Payer: Self-pay

## 2022-07-14 DIAGNOSIS — J3081 Allergic rhinitis due to animal (cat) (dog) hair and dander: Secondary | ICD-10-CM | POA: Diagnosis not present

## 2022-07-14 DIAGNOSIS — J3089 Other allergic rhinitis: Secondary | ICD-10-CM | POA: Diagnosis not present

## 2022-07-14 DIAGNOSIS — J301 Allergic rhinitis due to pollen: Secondary | ICD-10-CM | POA: Diagnosis not present

## 2022-07-14 DIAGNOSIS — U071 COVID-19: Secondary | ICD-10-CM

## 2022-07-14 MED ORDER — ALBUTEROL SULFATE HFA 108 (90 BASE) MCG/ACT IN AERS
2.0000 | INHALATION_SPRAY | Freq: Four times a day (QID) | RESPIRATORY_TRACT | 0 refills | Status: DC | PRN
Start: 2022-07-14 — End: 2023-04-18
  Filled 2022-07-14: qty 6.7, 25d supply, fill #0

## 2022-07-21 ENCOUNTER — Other Ambulatory Visit (HOSPITAL_COMMUNITY): Payer: Self-pay

## 2022-07-23 DIAGNOSIS — J3081 Allergic rhinitis due to animal (cat) (dog) hair and dander: Secondary | ICD-10-CM | POA: Diagnosis not present

## 2022-07-23 DIAGNOSIS — J301 Allergic rhinitis due to pollen: Secondary | ICD-10-CM | POA: Diagnosis not present

## 2022-07-23 DIAGNOSIS — J3089 Other allergic rhinitis: Secondary | ICD-10-CM | POA: Diagnosis not present

## 2022-07-26 DIAGNOSIS — K219 Gastro-esophageal reflux disease without esophagitis: Secondary | ICD-10-CM | POA: Diagnosis not present

## 2022-07-26 DIAGNOSIS — R1310 Dysphagia, unspecified: Secondary | ICD-10-CM | POA: Diagnosis not present

## 2022-07-26 DIAGNOSIS — Z86010 Personal history of colonic polyps: Secondary | ICD-10-CM | POA: Diagnosis not present

## 2022-07-26 DIAGNOSIS — K5730 Diverticulosis of large intestine without perforation or abscess without bleeding: Secondary | ICD-10-CM | POA: Diagnosis not present

## 2022-07-26 DIAGNOSIS — K317 Polyp of stomach and duodenum: Secondary | ICD-10-CM | POA: Diagnosis not present

## 2022-07-26 DIAGNOSIS — K449 Diaphragmatic hernia without obstruction or gangrene: Secondary | ICD-10-CM | POA: Diagnosis not present

## 2022-07-26 DIAGNOSIS — Z1211 Encounter for screening for malignant neoplasm of colon: Secondary | ICD-10-CM | POA: Diagnosis not present

## 2022-08-02 ENCOUNTER — Other Ambulatory Visit (HOSPITAL_COMMUNITY): Payer: Self-pay

## 2022-08-02 ENCOUNTER — Other Ambulatory Visit: Payer: Self-pay

## 2022-08-04 DIAGNOSIS — J3081 Allergic rhinitis due to animal (cat) (dog) hair and dander: Secondary | ICD-10-CM | POA: Diagnosis not present

## 2022-08-04 DIAGNOSIS — J3089 Other allergic rhinitis: Secondary | ICD-10-CM | POA: Diagnosis not present

## 2022-08-04 DIAGNOSIS — J301 Allergic rhinitis due to pollen: Secondary | ICD-10-CM | POA: Diagnosis not present

## 2022-08-11 ENCOUNTER — Other Ambulatory Visit (HOSPITAL_COMMUNITY): Payer: Self-pay

## 2022-08-12 ENCOUNTER — Other Ambulatory Visit (HOSPITAL_COMMUNITY): Payer: Self-pay

## 2022-08-12 DIAGNOSIS — J3081 Allergic rhinitis due to animal (cat) (dog) hair and dander: Secondary | ICD-10-CM | POA: Diagnosis not present

## 2022-08-12 DIAGNOSIS — J3089 Other allergic rhinitis: Secondary | ICD-10-CM | POA: Diagnosis not present

## 2022-08-12 DIAGNOSIS — J301 Allergic rhinitis due to pollen: Secondary | ICD-10-CM | POA: Diagnosis not present

## 2022-08-12 MED ORDER — EPINEPHRINE 0.3 MG/0.3ML IJ SOAJ
INTRAMUSCULAR | 1 refills | Status: AC
  Filled 2022-08-12 – 2022-08-24 (×2): qty 2, 2d supply, fill #0

## 2022-08-17 DIAGNOSIS — K219 Gastro-esophageal reflux disease without esophagitis: Secondary | ICD-10-CM | POA: Diagnosis not present

## 2022-08-17 DIAGNOSIS — Z86010 Personal history of colonic polyps: Secondary | ICD-10-CM | POA: Diagnosis not present

## 2022-08-17 DIAGNOSIS — K440 Diaphragmatic hernia with obstruction, without gangrene: Secondary | ICD-10-CM | POA: Diagnosis not present

## 2022-08-17 DIAGNOSIS — E669 Obesity, unspecified: Secondary | ICD-10-CM | POA: Diagnosis not present

## 2022-08-23 ENCOUNTER — Other Ambulatory Visit (HOSPITAL_COMMUNITY): Payer: Self-pay

## 2022-08-24 ENCOUNTER — Other Ambulatory Visit (HOSPITAL_COMMUNITY): Payer: Self-pay

## 2022-08-25 ENCOUNTER — Encounter: Payer: Self-pay | Admitting: Internal Medicine

## 2022-08-25 ENCOUNTER — Other Ambulatory Visit: Payer: Self-pay

## 2022-08-25 DIAGNOSIS — J301 Allergic rhinitis due to pollen: Secondary | ICD-10-CM | POA: Diagnosis not present

## 2022-08-25 DIAGNOSIS — J3081 Allergic rhinitis due to animal (cat) (dog) hair and dander: Secondary | ICD-10-CM | POA: Diagnosis not present

## 2022-08-25 DIAGNOSIS — J3089 Other allergic rhinitis: Secondary | ICD-10-CM | POA: Diagnosis not present

## 2022-08-25 DIAGNOSIS — E6609 Other obesity due to excess calories: Secondary | ICD-10-CM

## 2022-08-26 ENCOUNTER — Other Ambulatory Visit (HOSPITAL_COMMUNITY): Payer: Self-pay

## 2022-08-26 MED ORDER — PHENTERMINE HCL 15 MG PO CAPS
15.0000 mg | ORAL_CAPSULE | ORAL | 2 refills | Status: DC
Start: 2022-08-26 — End: 2022-12-23
  Filled 2022-08-26: qty 30, 30d supply, fill #0
  Filled 2022-09-30: qty 30, 30d supply, fill #1
  Filled 2022-10-28: qty 30, 30d supply, fill #2

## 2022-08-26 NOTE — Telephone Encounter (Signed)

## 2022-08-27 ENCOUNTER — Other Ambulatory Visit: Payer: Self-pay

## 2022-08-27 ENCOUNTER — Ambulatory Visit (INDEPENDENT_AMBULATORY_CARE_PROVIDER_SITE_OTHER): Payer: 59 | Admitting: Internal Medicine

## 2022-08-27 ENCOUNTER — Encounter: Payer: Self-pay | Admitting: Internal Medicine

## 2022-08-27 ENCOUNTER — Telehealth: Payer: Self-pay | Admitting: Internal Medicine

## 2022-08-27 VITALS — BP 116/78 | HR 65 | Temp 98.9°F | Ht 68.5 in | Wt 197.6 lb

## 2022-08-27 DIAGNOSIS — R300 Dysuria: Secondary | ICD-10-CM | POA: Diagnosis not present

## 2022-08-27 DIAGNOSIS — N3001 Acute cystitis with hematuria: Secondary | ICD-10-CM | POA: Insufficient documentation

## 2022-08-27 DIAGNOSIS — R350 Frequency of micturition: Secondary | ICD-10-CM

## 2022-08-27 LAB — POC URINALSYSI DIPSTICK (AUTOMATED)
Bilirubin, UA: NEGATIVE
Glucose, UA: NEGATIVE
Ketones, UA: NEGATIVE
Nitrite, UA: POSITIVE
Protein, UA: POSITIVE — AB
Spec Grav, UA: 1.025 (ref 1.010–1.025)
Urobilinogen, UA: 0.2 E.U./dL — AB
pH, UA: 6 (ref 5.0–8.0)

## 2022-08-27 MED ORDER — SULFAMETHOXAZOLE-TRIMETHOPRIM 800-160 MG PO TABS: 1.0000 | ORAL_TABLET | Freq: Two times a day (BID) | ORAL | 0 refills | Status: DC

## 2022-08-27 NOTE — Assessment & Plan Note (Signed)
Acute Urine dip consistent with UTI Will send urine for culture Take the antibiotic as prescribed.  Bactrim DS twice daily x 5 days Take tylenol if needed.   Increase your water intake.  Call if no improvement

## 2022-08-27 NOTE — Progress Notes (Signed)
Subjective:    Patient ID: Colleen Schmitt, female    DOB: 08/30/1970, 52 y.o.   MRN: [id number]      HPI [name] is here for  Chief Complaint  Patient presents with   Urinary Tract Infection    Symptoms started 3 days ago.  She states increased urinary frequency and pressure with urinating.  Today she did notice gross hematuria.  She denies any dysuria.  She has had a little lower back discomfort, but no flank pain.  She denies any nausea or fevers.     Medications and allergies reviewed with patient and updated if appropriate.  Current Outpatient Medications on File Prior to Visit  Medication Sig Dispense Refill   albuterol (VENTOLIN HFA) 108 (90 Base) MCG/ACT inhaler Inhale 2 puffs into the lungs every 6 (six) hours as needed for wheezing or shortness of breath. 6.7 g 0   buPROPion (WELLBUTRIN XL) 300 MG 24 hr tablet Take 1 tablet (300 mg total) by mouth daily. 30 tablet 5   Cholecalciferol (VITAMIN D3) 125 MCG (5000 UT) TABS Take 1 tablet by mouth daily.     EPINEPHrine (EPIPEN 2-PAK) 0.3 mg/0.3 mL IJ SOAJ injection Inject 0.3 mg into the muscle as needed for severe allergic reaction. 2 each 1   EPINEPHrine (EPIPEN 2-PAK) 0.3 mg/0.3 mL IJ SOAJ injection inject as directed if needed for allergic reaction 2 each 1   famotidine (PEPCID) 40 MG tablet Take 1 tablet (40 mg total) by mouth daily for heartburn 90 tablet 3   levocetirizine (XYZAL) 5 MG tablet Take 1 tablet (5 mg total) by mouth every evening. 90 tablet 3   mometasone (NASONEX) 50 MCG/ACT nasal spray Place 1-2 sprays into each nostril daily. 17 g 3   montelukast (SINGULAIR) 10 MG tablet Take 1 tablet (10 mg total) by mouth at bedtime. 30 tablet 6   Multiple Vitamin (MULTIVITAMIN) tablet Take by mouth.     Omega-3 Fatty Acids (OMEGA 3 500 PO) Take 1 capsule by mouth daily.     omeprazole (PRILOSEC) 40 MG capsule Take 1 capsule (40 mg total) by mouth daily and 2 times per day when needed 30 minutes before meals. 60  capsule 2   phentermine 15 MG capsule Take 1 capsule (15 mg total) by mouth every morning. 30 capsule 2   Probiotic Product (PROBIOTIC DAILY) CAPS Take 1 capsule by mouth daily.     triamcinolone (NASACORT) 55 MCG/ACT AERO nasal inhaler Place into the nose.     Zinc 30 MG CAPS Take 1 capsule by mouth daily.     benzonatate (TESSALON PERLES) 100 MG capsule Take 1-2 capsules (100-200 mg total) by mouth up to 2 (two) times daily as needed for cough. (Patient not taking: Reported on 08/27/2022) 30 capsule 0   No current facility-administered medications on file prior to visit.    Review of Systems  Constitutional:  Negative for fever.  Gastrointestinal:  Negative for abdominal pain and nausea.  Genitourinary:  Positive for frequency and hematuria. Negative for dysuria.  Musculoskeletal:  Positive for back pain (Lower back).       Objective:   Vitals:   08/27/22 1107  BP: 116/78  Pulse: 65  Temp: 98.9 F (37.2 C)  SpO2: 98%   BP Readings from Last 3 Encounters:  08/27/22 116/78  12/08/21 108/74  10/12/21 108/78   Wt Readings from Last 3 Encounters:  08/27/22 197 lb 9.6 oz (89.6 kg)  12/08/21 202 lb (91.6 kg)  11/17/21  185 lb (83.9 kg)   Body mass index is 29.61 kg/m.    Physical Exam Constitutional:      General: She is not in acute distress.    Appearance: Normal appearance. She is not ill-appearing.  HENT:     Head: Normocephalic.  Eyes:     Conjunctiva/sclera: Conjunctivae normal.  Abdominal:     General: There is no distension.     Palpations: Abdomen is soft.     Tenderness: There is abdominal tenderness (mild). There is no right CVA tenderness, left CVA tenderness, guarding or rebound.  Skin:    General: Skin is warm and dry.  Neurological:     Mental Status: She is alert.            Assessment & Plan:    See Problem List for Assessment and Plan of chronic medical problems.

## 2022-08-27 NOTE — Telephone Encounter (Signed)
Spoke with patient today and Dr. Lawerance Bach will work her in this morning.

## 2022-08-27 NOTE — Telephone Encounter (Signed)
Patient called and said she believes she has a UTI. She wanted to know if she could leave a sample with the lab because she is heading out of town later today. Patient would like a call back at [phone].

## 2022-08-27 NOTE — Patient Instructions (Signed)
Take the antibiotic as prescribed.  Take tylenol if needed.     Increase your water intake.   Call if no improvement     Urinary Tract Infection, Adult A urinary tract infection (UTI) is an infection of any part of the urinary tract, which includes the kidneys, ureters, bladder, and urethra. These organs make, store, and get rid of urine in the body. UTI can be a bladder infection (cystitis) or kidney infection (pyelonephritis). What are the causes? This infection may be caused by fungi, viruses, or bacteria. Bacteria are the most common cause of UTIs. This condition can also be caused by repeated incomplete emptying of the bladder during urination. What increases the risk? This condition is more likely to develop if:  You ignore your need to urinate or hold urine for long periods of time.  You do not empty your bladder completely during urination.  You wipe back to front after urinating or having a bowel movement, if you are female.  You are uncircumcised, if you are female.  You are constipated.  You have a urinary catheter that stays in place (indwelling).  You have a weak defense (immune) system.  You have a medical condition that affects your bowels, kidneys, or bladder.  You have diabetes.  You take antibiotic medicines frequently or for long periods of time, and the antibiotics no longer work well against certain types of infections (antibiotic resistance).  You take medicines that irritate your urinary tract.  You are exposed to chemicals that irritate your urinary tract.  You are female.  What are the signs or symptoms? Symptoms of this condition include:  Fever.  Frequent urination or passing small amounts of urine frequently.  Needing to urinate urgently.  Pain or burning with urination.  Urine that smells bad or unusual.  Cloudy urine.  Pain in the lower abdomen or back.  Trouble urinating.  Blood in the urine.  Vomiting or being less hungry than  normal.  Diarrhea or abdominal pain.  Vaginal discharge, if you are female.  How is this diagnosed? This condition is diagnosed with a medical history and physical exam. You will also need to provide a urine sample to test your urine. Other tests may be done, including:  Blood tests.  Sexually transmitted disease (STD) testing.  If you have had more than one UTI, a cystoscopy or imaging studies may be done to determine the cause of the infections. How is this treated? Treatment for this condition often includes a combination of two or more of the following:  Antibiotic medicine.  Other medicines to treat less common causes of UTI.  Over-the-counter medicines to treat pain.  Drinking enough water to stay hydrated.  Follow these instructions at home:  Take over-the-counter and prescription medicines only as told by your health care provider.  If you were prescribed an antibiotic, take it as told by your health care provider. Do not stop taking the antibiotic even if you start to feel better.  Avoid alcohol, caffeine, tea, and carbonated beverages. They can irritate your bladder.  Drink enough fluid to keep your urine clear or pale yellow.  Keep all follow-up visits as told by your health care provider. This is important.  Make sure to: ? Empty your bladder often and completely. Do not hold urine for long periods of time. ? Empty your bladder before and after sex. ? Wipe from front to back after a bowel movement if you are female. Use each tissue one time when you   wipe. Contact a health care provider if:  You have back pain.  You have a fever.  You feel nauseous or vomit.  Your symptoms do not get better after 3 days.  Your symptoms go away and then return. Get help right away if:  You have severe back pain or lower abdominal pain.  You are vomiting and cannot keep down any medicines or water. This information is not intended to replace advice given to you by  your health care provider. Make sure you discuss any questions you have with your health care provider. Document Released: 10/07/2004 Document Revised: 06/11/2015 Document Reviewed: 11/18/2014 Elsevier Interactive Patient Education  2018 [employer]   

## 2022-08-29 LAB — CULTURE, URINE COMPREHENSIVE

## 2022-09-01 DIAGNOSIS — J301 Allergic rhinitis due to pollen: Secondary | ICD-10-CM | POA: Diagnosis not present

## 2022-09-01 DIAGNOSIS — J3089 Other allergic rhinitis: Secondary | ICD-10-CM | POA: Diagnosis not present

## 2022-09-01 DIAGNOSIS — J3081 Allergic rhinitis due to animal (cat) (dog) hair and dander: Secondary | ICD-10-CM | POA: Diagnosis not present

## 2022-09-06 DIAGNOSIS — F32A Depression, unspecified: Secondary | ICD-10-CM | POA: Diagnosis not present

## 2022-09-06 DIAGNOSIS — J3089 Other allergic rhinitis: Secondary | ICD-10-CM | POA: Diagnosis not present

## 2022-09-06 DIAGNOSIS — J3081 Allergic rhinitis due to animal (cat) (dog) hair and dander: Secondary | ICD-10-CM | POA: Diagnosis not present

## 2022-09-06 DIAGNOSIS — J301 Allergic rhinitis due to pollen: Secondary | ICD-10-CM | POA: Diagnosis not present

## 2022-09-15 DIAGNOSIS — J3081 Allergic rhinitis due to animal (cat) (dog) hair and dander: Secondary | ICD-10-CM | POA: Diagnosis not present

## 2022-09-15 DIAGNOSIS — J3089 Other allergic rhinitis: Secondary | ICD-10-CM | POA: Diagnosis not present

## 2022-09-15 DIAGNOSIS — J301 Allergic rhinitis due to pollen: Secondary | ICD-10-CM | POA: Diagnosis not present

## 2022-09-21 DIAGNOSIS — J3089 Other allergic rhinitis: Secondary | ICD-10-CM | POA: Diagnosis not present

## 2022-09-21 DIAGNOSIS — J3081 Allergic rhinitis due to animal (cat) (dog) hair and dander: Secondary | ICD-10-CM | POA: Diagnosis not present

## 2022-09-21 DIAGNOSIS — J301 Allergic rhinitis due to pollen: Secondary | ICD-10-CM | POA: Diagnosis not present

## 2022-09-23 ENCOUNTER — Encounter: Payer: Self-pay | Admitting: Internal Medicine

## 2022-09-23 ENCOUNTER — Other Ambulatory Visit: Payer: Self-pay

## 2022-09-23 ENCOUNTER — Other Ambulatory Visit: Payer: Self-pay | Admitting: Nurse Practitioner

## 2022-09-23 ENCOUNTER — Telehealth (INDEPENDENT_AMBULATORY_CARE_PROVIDER_SITE_OTHER): Payer: 59 | Admitting: Nurse Practitioner

## 2022-09-23 DIAGNOSIS — R300 Dysuria: Secondary | ICD-10-CM

## 2022-09-23 LAB — URINALYSIS WITH CULTURE, IF INDICATED
Bilirubin Urine: NEGATIVE
Hgb urine dipstick: NEGATIVE
Ketones, ur: NEGATIVE
Leukocytes,Ua: NEGATIVE
Nitrite: NEGATIVE
RBC / HPF: NONE SEEN (ref 0–?)
Specific Gravity, Urine: 1.025 (ref 1.000–1.030)
Total Protein, Urine: NEGATIVE
Urine Glucose: NEGATIVE
Urobilinogen, UA: 0.2 (ref 0.0–1.0)
WBC, UA: NONE SEEN (ref 0–?)
pH: 6 (ref 5.0–8.0)

## 2022-09-23 MED ORDER — SULFAMETHOXAZOLE-TRIMETHOPRIM 800-160 MG PO TABS
1.0000 | ORAL_TABLET | Freq: Two times a day (BID) | ORAL | 0 refills | Status: DC
  Filled 2022-09-23: qty 10, 5d supply, fill #0

## 2022-09-23 NOTE — Telephone Encounter (Signed)
Patient has been scheduled with Jiles Prows today at 5:40 for a VV and will bring urine in this morning.

## 2022-09-23 NOTE — Progress Notes (Signed)
   Established Patient Office Visit  An audio/visual tele-health visit was completed today for this patient. I connected with  Colleen Schmitt on 09/23/22 utilizing audio/visual technology and verified that I am speaking with the correct person using two identifiers. The patient was located at their home, and I was located at the office of [facility] at [facility] during the encounter. I discussed the limitations of evaluation and management by telemedicine. The patient expressed understanding and agreed to proceed.    Subjective   Patient ID: Colleen Schmitt, female    DOB: [birth year]  Age: 52 y.o. MRN: [id number]  Chief Complaint  Patient presents with   Urinary Tract Infection    Been going for two days, no burning and back pain ( discomfort)    UTI?: symptoms started 2 days ago. Has right-sided flank pain and took an OTC UTI kit and this was positive for evidence of UTI.  Was for UTI about 1 month ago.  Is menopausal..  Today experiencing flank pain, no hematuria, fever, nausea.  Was treated with Bactrim and tolerated this well.  Reports she had 2 tablets leftover, and took 1 tablet last night.     Review of Systems  Constitutional:  Negative for chills.  Gastrointestinal:  Negative for abdominal pain, nausea and vomiting.  Genitourinary:  Positive for flank pain. Negative for dysuria and hematuria.      Objective:     There were no vitals taken for this visit.   Physical Exam Comprehensive physical exam not completed today as office visit was conducted remotely.  Patient appears well over video in no acute distress noted.  Patient was alert and oriented, and appeared to have appropriate judgment.   No results found for any visits on 09/23/22.    The 10-year ASCVD risk score (Arnett DK, et al., 2019) is: 1.8%    Assessment & Plan:   Problem List Items Addressed This Visit       Other   Dysuria    Acute, history consistent with UTI Urinalysis with  urine culture ordered, labs pending. In the interim we will treat her for UTI with Bactrim 1 double strength tablet twice a day x 5 days. Pending on urine testing results plan may change. Patient encouraged to focus on hydration. Also discussed if symptoms recur at least once more over the next year she should consider discussion with PCP regarding either preventative medication and/or further evaluation.      Relevant Medications   sulfamethoxazole-trimethoprim (BACTRIM DS) 800-160 MG tablet    Return if symptoms worsen or fail to improve.    Elenore Paddy, NP

## 2022-09-23 NOTE — Assessment & Plan Note (Signed)
Acute, history consistent with UTI Urinalysis with urine culture ordered, labs pending. In the interim we will treat her for UTI with Bactrim 1 double strength tablet twice a day x 5 days. Pending on urine testing results plan may change. Patient encouraged to focus on hydration. Also discussed if symptoms recur at least once more over the next year she should consider discussion with PCP regarding either preventative medication and/or further evaluation.

## 2022-09-24 ENCOUNTER — Other Ambulatory Visit (HOSPITAL_COMMUNITY): Payer: Self-pay

## 2022-09-24 LAB — URINE CULTURE: Result:: NO GROWTH

## 2022-09-29 ENCOUNTER — Other Ambulatory Visit (HOSPITAL_COMMUNITY): Payer: Self-pay

## 2022-09-30 ENCOUNTER — Other Ambulatory Visit: Payer: Self-pay

## 2022-09-30 ENCOUNTER — Other Ambulatory Visit (HOSPITAL_COMMUNITY): Payer: Self-pay

## 2022-10-01 DIAGNOSIS — J3081 Allergic rhinitis due to animal (cat) (dog) hair and dander: Secondary | ICD-10-CM | POA: Diagnosis not present

## 2022-10-01 DIAGNOSIS — J3089 Other allergic rhinitis: Secondary | ICD-10-CM | POA: Diagnosis not present

## 2022-10-01 DIAGNOSIS — J301 Allergic rhinitis due to pollen: Secondary | ICD-10-CM | POA: Diagnosis not present

## 2022-10-06 DIAGNOSIS — J3081 Allergic rhinitis due to animal (cat) (dog) hair and dander: Secondary | ICD-10-CM | POA: Diagnosis not present

## 2022-10-06 DIAGNOSIS — J301 Allergic rhinitis due to pollen: Secondary | ICD-10-CM | POA: Diagnosis not present

## 2022-10-06 DIAGNOSIS — J3089 Other allergic rhinitis: Secondary | ICD-10-CM | POA: Diagnosis not present

## 2022-10-14 ENCOUNTER — Other Ambulatory Visit: Payer: Self-pay

## 2022-10-14 DIAGNOSIS — J3081 Allergic rhinitis due to animal (cat) (dog) hair and dander: Secondary | ICD-10-CM | POA: Diagnosis not present

## 2022-10-14 DIAGNOSIS — J3089 Other allergic rhinitis: Secondary | ICD-10-CM | POA: Diagnosis not present

## 2022-10-14 DIAGNOSIS — J301 Allergic rhinitis due to pollen: Secondary | ICD-10-CM | POA: Diagnosis not present

## 2022-10-21 DIAGNOSIS — J301 Allergic rhinitis due to pollen: Secondary | ICD-10-CM | POA: Diagnosis not present

## 2022-10-21 DIAGNOSIS — J3081 Allergic rhinitis due to animal (cat) (dog) hair and dander: Secondary | ICD-10-CM | POA: Diagnosis not present

## 2022-10-21 DIAGNOSIS — J3089 Other allergic rhinitis: Secondary | ICD-10-CM | POA: Diagnosis not present

## 2022-10-28 ENCOUNTER — Other Ambulatory Visit: Payer: Self-pay

## 2022-10-28 ENCOUNTER — Other Ambulatory Visit (HOSPITAL_COMMUNITY): Payer: Self-pay

## 2022-11-11 ENCOUNTER — Ambulatory Visit: Payer: 59 | Admitting: Sports Medicine

## 2022-11-11 ENCOUNTER — Encounter: Payer: Self-pay | Admitting: Family Medicine

## 2022-11-11 ENCOUNTER — Ambulatory Visit: Payer: 59 | Admitting: Family Medicine

## 2022-11-11 VITALS — BP 122/80 | Ht 68.0 in | Wt 195.0 lb

## 2022-11-11 DIAGNOSIS — M7742 Metatarsalgia, left foot: Secondary | ICD-10-CM | POA: Diagnosis not present

## 2022-11-11 DIAGNOSIS — M21611 Bunion of right foot: Secondary | ICD-10-CM | POA: Diagnosis not present

## 2022-11-11 DIAGNOSIS — M21612 Bunion of left foot: Secondary | ICD-10-CM

## 2022-11-11 DIAGNOSIS — M7741 Metatarsalgia, right foot: Secondary | ICD-10-CM | POA: Diagnosis not present

## 2022-11-11 NOTE — Assessment & Plan Note (Signed)
B/l bunions - not causing any significant pain, but likely contributing to metatarsalgia from transverse arch collapse - Hapad sports insoles with bilateral small scaphoid pads & bilateral small metatarsal pads -- insoles were comfortable in the office and improved pronation -Counseled on wearing arch support is much as possible & wearing supportive shoes with wide toe box -Avoid barefoot walking

## 2022-11-11 NOTE — Progress Notes (Signed)
    SUBJECTIVE:   CHIEF COMPLAINT / HPI: b/l foot pain  Colleen Schmitt is a very pleasant 52 year old presenting today for bilateral midfoot pain that she describes as achy with occasional burning that has been intermittent and ongoing for the past year.  She notes that she is a remote history of plantar fasciitis but that this feels different.  And states she has known midfoot bone spurs on both feet on the dorsal surface of her midfoot seen on x-ray imaging done at [facility].  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  Has not taken any medications for the pain.  She has tried over-the-counter arch supports that has helped her pain.  PERTINENT  PMH / PSH: OSA, GERD, Asthma, Anxiety, Depression  OBJECTIVE:   BP 122/80   Ht 5\' 8"  (1.727 m)   Wt 195 lb (88.5 kg)   BMI 29.65 kg/m   General: NAD, well appearing Neuro: A&O Respiratory: normal WOB on RA Extremities: Moving all 4 extremities equally  Bilateral feet Inspection: Bilateral pronation, bilateral loss of transverse arch, bilateral bunion worse on the left, bilateral great toe valgus, bilateral pes planus, no obvious deformity, erythema, swelling, ecchymoses. Palpation: Mildly tender to palpation bilaterally over metatarsal heads on the plantar surface, nontender over plantar fascia origin ROM: Full range of motion dorsiflexion, plantarflexion, eversion, inversion, no hallux rigidus b/l Special Tests: mild discomfort with Metatarsal squeeze bilaterally Strength: Full strength bilaterally in all directions, posterior tibialis function is intact b/l Additional: Gait with significant pronation proximal and mid foot  Pronation significantly improved with addition of sports insoles with arch and metatarsal support below   ASSESSMENT/PLAN:   Assessment & Plan Metatarsalgia of both feet Bilateral midfoot pain likely secondary to bilateral metatarsalgia due  to biomechanical imbalance from pronation, pes planus and loss of bilateral transverse arch.  Patient is at high risk for future Morton's neuroma development however do not suspect that is cause of her symptoms at this time.  Patient agreeable to plan below. -Hapad sports insoles with bilateral small scaphoid pads & bilateral small metatarsal pads -- insoles were comfortable in the office and improved pronation -Counseled on wearing arch support is much as possible & wearing supportive shoes -Avoid barefoot walking -Counseled on as needed Tylenol 500 mg - foot soaks prn - could consider custom orthotics in the future - did review potential out-of-pocket costs Bilateral bunions B/l bunions - not causing any significant pain, but likely contributing to metatarsalgia from transverse arch collapse - Hapad sports insoles with bilateral small scaphoid pads & bilateral small metatarsal pads -- insoles were comfortable in the office and improved pronation -Counseled on wearing arch support is much as possible & wearing supportive shoes with wide toe box -Avoid barefoot walking  Return in about 4 weeks (around 12/09/2022), or if symptoms worsen or fail to improve.  Andi Devon, DO [facility]   Addendum:  Patient seen and examined in the office with resident - Dr Celine Mans, MD, PGY-2.  History, exam, plan of care were precepted with me.  Agree with findings as noted above.  Changes and updates made to note above.   Darene Lamer, DO, CAQSM

## 2022-11-15 ENCOUNTER — Other Ambulatory Visit (HOSPITAL_COMMUNITY): Payer: Self-pay

## 2022-11-15 DIAGNOSIS — J3089 Other allergic rhinitis: Secondary | ICD-10-CM | POA: Diagnosis not present

## 2022-11-15 DIAGNOSIS — J301 Allergic rhinitis due to pollen: Secondary | ICD-10-CM | POA: Diagnosis not present

## 2022-11-15 DIAGNOSIS — J3081 Allergic rhinitis due to animal (cat) (dog) hair and dander: Secondary | ICD-10-CM | POA: Diagnosis not present

## 2022-11-26 DIAGNOSIS — J301 Allergic rhinitis due to pollen: Secondary | ICD-10-CM | POA: Diagnosis not present

## 2022-11-26 DIAGNOSIS — J3081 Allergic rhinitis due to animal (cat) (dog) hair and dander: Secondary | ICD-10-CM | POA: Diagnosis not present

## 2022-11-26 DIAGNOSIS — J3089 Other allergic rhinitis: Secondary | ICD-10-CM | POA: Diagnosis not present

## 2022-12-03 DIAGNOSIS — J3081 Allergic rhinitis due to animal (cat) (dog) hair and dander: Secondary | ICD-10-CM | POA: Diagnosis not present

## 2022-12-03 DIAGNOSIS — J301 Allergic rhinitis due to pollen: Secondary | ICD-10-CM | POA: Diagnosis not present

## 2022-12-03 DIAGNOSIS — J3089 Other allergic rhinitis: Secondary | ICD-10-CM | POA: Diagnosis not present

## 2022-12-08 DIAGNOSIS — J301 Allergic rhinitis due to pollen: Secondary | ICD-10-CM | POA: Diagnosis not present

## 2022-12-08 DIAGNOSIS — J3089 Other allergic rhinitis: Secondary | ICD-10-CM | POA: Diagnosis not present

## 2022-12-08 DIAGNOSIS — J3081 Allergic rhinitis due to animal (cat) (dog) hair and dander: Secondary | ICD-10-CM | POA: Diagnosis not present

## 2022-12-09 ENCOUNTER — Other Ambulatory Visit: Payer: Self-pay | Admitting: Internal Medicine

## 2022-12-09 ENCOUNTER — Other Ambulatory Visit (HOSPITAL_COMMUNITY): Payer: Self-pay

## 2022-12-12 ENCOUNTER — Other Ambulatory Visit (HOSPITAL_COMMUNITY): Payer: Self-pay

## 2022-12-12 MED ORDER — MONTELUKAST SODIUM 10 MG PO TABS
10.0000 mg | ORAL_TABLET | Freq: Every day | ORAL | 3 refills | Status: DC
  Filled 2022-12-12: qty 30, 30d supply, fill #0
  Filled 2023-01-07: qty 30, 30d supply, fill #1
  Filled 2023-02-06: qty 30, 30d supply, fill #2
  Filled 2023-03-16 (×2): qty 30, 30d supply, fill #3

## 2022-12-13 ENCOUNTER — Other Ambulatory Visit (HOSPITAL_COMMUNITY): Payer: Self-pay

## 2022-12-13 ENCOUNTER — Ambulatory Visit: Payer: 59 | Admitting: Family Medicine

## 2022-12-13 ENCOUNTER — Other Ambulatory Visit: Payer: Self-pay

## 2022-12-13 MED ORDER — FAMOTIDINE 40 MG PO TABS
40.0000 mg | ORAL_TABLET | Freq: Every day | ORAL | 0 refills | Status: DC | PRN
  Filled 2022-12-13 – 2023-01-07 (×2): qty 30, 30d supply, fill #0

## 2022-12-13 MED ORDER — BUPROPION HCL ER (XL) 300 MG PO TB24
300.0000 mg | ORAL_TABLET | Freq: Every day | ORAL | 0 refills | Status: DC
  Filled 2022-12-13: qty 30, 30d supply, fill #0

## 2022-12-13 MED ORDER — LEVOCETIRIZINE DIHYDROCHLORIDE 5 MG PO TABS
5.0000 mg | ORAL_TABLET | Freq: Every evening | ORAL | 0 refills | Status: DC
  Filled 2022-12-13: qty 30, 30d supply, fill #0

## 2022-12-14 ENCOUNTER — Other Ambulatory Visit: Payer: Self-pay

## 2022-12-14 ENCOUNTER — Ambulatory Visit: Payer: 59 | Admitting: Sports Medicine

## 2022-12-22 ENCOUNTER — Encounter: Payer: Self-pay | Admitting: Internal Medicine

## 2022-12-22 DIAGNOSIS — J3081 Allergic rhinitis due to animal (cat) (dog) hair and dander: Secondary | ICD-10-CM | POA: Diagnosis not present

## 2022-12-22 DIAGNOSIS — J301 Allergic rhinitis due to pollen: Secondary | ICD-10-CM | POA: Diagnosis not present

## 2022-12-22 DIAGNOSIS — J3089 Other allergic rhinitis: Secondary | ICD-10-CM | POA: Diagnosis not present

## 2022-12-22 NOTE — Patient Instructions (Addendum)

## 2022-12-22 NOTE — Progress Notes (Unsigned)
Subjective:    Patient ID: Colleen Schmitt, female    DOB: March 11, 1970, 52 y.o.   MRN: [id number]      HPI Colleen Schmitt is here for a Physical exam and her chronic medical problems.   Postmenopausal?  Estro vag ? -    Medications and allergies reviewed with patient and updated if appropriate.  Current Outpatient Medications on File Prior to Visit  Medication Sig Dispense Refill   albuterol (VENTOLIN HFA) 108 (90 Base) MCG/ACT inhaler Inhale 2 puffs into the lungs every 6 (six) hours as needed for wheezing or shortness of breath. 6.7 g 0   buPROPion (WELLBUTRIN XL) 300 MG 24 hr tablet Take 1 tablet (300 mg total) by mouth daily. 30 tablet 0   Cholecalciferol (VITAMIN D3) 125 MCG (5000 UT) TABS Take 1 tablet by mouth daily.     EPINEPHrine (EPIPEN 2-PAK) 0.3 mg/0.3 mL IJ SOAJ injection Inject 0.3 mg into the muscle as needed for severe allergic reaction. 2 each 1   EPINEPHrine (EPIPEN 2-PAK) 0.3 mg/0.3 mL IJ SOAJ injection inject as directed if needed for allergic reaction 2 each 1   famotidine (PEPCID) 40 MG tablet Take 1 tablet (40 mg total) by mouth daily for heartburn 30 tablet 0   levocetirizine (XYZAL) 5 MG tablet Take 1 tablet (5 mg total) by mouth every evening. 30 tablet 0   mometasone (NASONEX) 50 MCG/ACT nasal spray Place 1-2 sprays into each nostril daily. 17 g 3   montelukast (SINGULAIR) 10 MG tablet Take 1 tablet (10 mg total) by mouth at bedtime. 30 tablet 3   Multiple Vitamin (MULTIVITAMIN) tablet Take by mouth.     Omega-3 Fatty Acids (OMEGA 3 500 PO) Take 1 capsule by mouth daily.     omeprazole (PRILOSEC) 40 MG capsule Take 1 capsule (40 mg total) by mouth daily and 2 times per day when needed 30 minutes before meals. 60 capsule 2   phentermine 15 MG capsule Take 1 capsule (15 mg total) by mouth every morning. 30 capsule 2   Probiotic Product (PROBIOTIC DAILY) CAPS Take 1 capsule by mouth daily.     sulfamethoxazole-trimethoprim (BACTRIM DS) 800-160 MG tablet Take 1  tablet by mouth 2 (two) times daily for 5 days 10 tablet 0   triamcinolone (NASACORT) 55 MCG/ACT AERO nasal inhaler Place into the nose.     Zinc 30 MG CAPS Take 1 capsule by mouth daily.     No current facility-administered medications on file prior to visit.    Review of Systems     Objective:  There were no vitals filed for this visit. There were no vitals filed for this visit. There is no height or weight on file to calculate BMI.  BP Readings from Last 3 Encounters:  11/11/22 122/80  08/27/22 116/78  12/08/21 108/74    Wt Readings from Last 3 Encounters:  11/11/22 195 lb (88.5 kg)  08/27/22 197 lb 9.6 oz (89.6 kg)  12/08/21 202 lb (91.6 kg)       Physical Exam Constitutional: She appears well-developed and well-nourished. No distress.  HENT:  Head: Normocephalic and atraumatic.  Right Ear: External ear normal. Normal ear canal and TM Left Ear: External ear normal.  Normal ear canal and TM Mouth/Throat: Oropharynx is clear and moist.  Eyes: Conjunctivae normal.  Neck: Neck supple. No tracheal deviation present. No thyromegaly present.  No carotid bruit  Cardiovascular: Normal rate, regular rhythm and normal heart sounds.   No murmur heard.  No edema. Pulmonary/Chest: Effort normal and breath sounds normal. No respiratory distress. She has no wheezes. She has no rales.  Breast: deferred   Abdominal: Soft. She exhibits no distension. There is no tenderness.  Lymphadenopathy: She has no cervical adenopathy.  Skin: Skin is warm and dry. She is not diaphoretic.  Psychiatric: She has a normal mood and affect. Her behavior is normal.     Lab Results  Component Value Date   WBC 4.3 10/12/2021   HGB 12.9 10/12/2021   HCT 39.2 10/12/2021   PLT 229.0 10/12/2021   GLUCOSE 94 10/12/2021   CHOL 212 (H) 10/12/2021   TRIG 141.0 10/12/2021   HDL 81.50 10/12/2021   LDLCALC 103 (H) 10/12/2021   ALT 12 10/12/2021   AST 15 10/12/2021   NA 138 10/12/2021   K 3.9  10/12/2021   CL 103 10/12/2021   CREATININE 0.77 10/12/2021   BUN 13 10/12/2021   CO2 28 10/12/2021   TSH 1.91 10/12/2021   HGBA1C 5.5 10/12/2021         Assessment & Plan:   Physical exam: Screening blood work  ordered Exercise   Weight   Substance abuse  none   Reviewed recommended immunizations.   Health Maintenance  Topic Date Due   HIV Screening  Never done   Hepatitis C Screening  Never done   DTaP/Tdap/Td (1 - Tdap) Never done   Cervical Cancer Screening (HPV/Pap Cotest)  Never done   MAMMOGRAM  Never done   INFLUENZA VACCINE  08/12/2022   COVID-19 Vaccine (2 - 2023-24 season) 09/12/2022   Colonoscopy  06/14/2027   HPV VACCINES  Aged Out   Zoster Vaccines- Shingrix  Discontinued          See Problem List for Assessment and Plan of chronic medical problems.

## 2022-12-23 ENCOUNTER — Ambulatory Visit (INDEPENDENT_AMBULATORY_CARE_PROVIDER_SITE_OTHER): Payer: 59 | Admitting: Internal Medicine

## 2022-12-23 VITALS — BP 122/78 | HR 72 | Temp 98.6°F | Ht 68.0 in | Wt 210.0 lb

## 2022-12-23 DIAGNOSIS — E88819 Insulin resistance, unspecified: Secondary | ICD-10-CM

## 2022-12-23 DIAGNOSIS — Z0000 Encounter for general adult medical examination without abnormal findings: Secondary | ICD-10-CM

## 2022-12-23 DIAGNOSIS — F419 Anxiety disorder, unspecified: Secondary | ICD-10-CM | POA: Diagnosis not present

## 2022-12-23 DIAGNOSIS — Z1159 Encounter for screening for other viral diseases: Secondary | ICD-10-CM | POA: Diagnosis not present

## 2022-12-23 DIAGNOSIS — Z114 Encounter for screening for human immunodeficiency virus [HIV]: Secondary | ICD-10-CM | POA: Diagnosis not present

## 2022-12-23 DIAGNOSIS — J4520 Mild intermittent asthma, uncomplicated: Secondary | ICD-10-CM

## 2022-12-23 DIAGNOSIS — E6609 Other obesity due to excess calories: Secondary | ICD-10-CM | POA: Diagnosis not present

## 2022-12-23 DIAGNOSIS — E66811 Obesity, class 1: Secondary | ICD-10-CM

## 2022-12-23 DIAGNOSIS — M2550 Pain in unspecified joint: Secondary | ICD-10-CM

## 2022-12-23 DIAGNOSIS — K219 Gastro-esophageal reflux disease without esophagitis: Secondary | ICD-10-CM

## 2022-12-23 DIAGNOSIS — F32A Depression, unspecified: Secondary | ICD-10-CM

## 2022-12-23 DIAGNOSIS — Z6830 Body mass index (BMI) 30.0-30.9, adult: Secondary | ICD-10-CM | POA: Diagnosis not present

## 2022-12-23 LAB — CBC WITH DIFFERENTIAL/PLATELET
Basophils Absolute: 0 10*3/uL (ref 0.0–0.1)
Basophils Relative: 1.3 % (ref 0.0–3.0)
Eosinophils Absolute: 0.1 10*3/uL (ref 0.0–0.7)
Eosinophils Relative: 2.1 % (ref 0.0–5.0)
HCT: 41.7 % (ref 36.0–46.0)
Hemoglobin: 13.9 g/dL (ref 12.0–15.0)
Lymphocytes Relative: 37.1 % (ref 12.0–46.0)
Lymphs Abs: 1.3 10*3/uL (ref 0.7–4.0)
MCHC: 33.3 g/dL (ref 30.0–36.0)
MCV: 88.3 fL (ref 78.0–100.0)
Monocytes Absolute: 0.3 10*3/uL (ref 0.1–1.0)
Monocytes Relative: 10 % (ref 3.0–12.0)
Neutro Abs: 1.7 10*3/uL (ref 1.4–7.7)
Neutrophils Relative %: 49.5 % (ref 43.0–77.0)
Platelets: 260 10*3/uL (ref 150.0–400.0)
RBC: 4.72 Mil/uL (ref 3.87–5.11)
RDW: 13.5 % (ref 11.5–15.5)
WBC: 3.4 10*3/uL — ABNORMAL LOW (ref 4.0–10.5)

## 2022-12-23 LAB — COMPREHENSIVE METABOLIC PANEL
ALT: 11 U/L (ref 0–35)
AST: 14 U/L (ref 0–37)
Albumin: 4.3 g/dL (ref 3.5–5.2)
Alkaline Phosphatase: 54 U/L (ref 39–117)
BUN: 13 mg/dL (ref 6–23)
CO2: 28 meq/L (ref 19–32)
Calcium: 9.5 mg/dL (ref 8.4–10.5)
Chloride: 103 meq/L (ref 96–112)
Creatinine, Ser: 0.74 mg/dL (ref 0.40–1.20)
GFR: 91.86 mL/min (ref >60.00)
Glucose, Bld: 89 mg/dL (ref 70–99)
Potassium: 4.2 meq/L (ref 3.5–5.1)
Sodium: 139 meq/L (ref 135–145)
Total Bilirubin: 0.6 mg/dL (ref 0.2–1.2)
Total Protein: 7.2 g/dL (ref 6.0–8.3)

## 2022-12-23 LAB — TSH: TSH: 1.9 u[IU]/mL (ref 0.35–5.50)

## 2022-12-23 LAB — LIPID PANEL
Cholesterol: 200 mg/dL (ref 0–200)
HDL: 68.7 mg/dL (ref >39.00)
LDL Cholesterol: 110 mg/dL — ABNORMAL HIGH (ref 0–99)
NonHDL: 131.32
Total CHOL/HDL Ratio: 3
Triglycerides: 105 mg/dL (ref 0.0–149.0)
VLDL: 21 mg/dL (ref 0.0–40.0)

## 2022-12-23 LAB — SEDIMENTATION RATE: Sed Rate: 11 mm/h (ref 0–30)

## 2022-12-23 LAB — C-REACTIVE PROTEIN: CRP: <1 mg/dL (ref 0.5–20.0)

## 2022-12-23 LAB — HEMOGLOBIN A1C: Hgb A1c MFr Bld: 5.5 % (ref 4.6–6.5)

## 2022-12-23 NOTE — Assessment & Plan Note (Addendum)
Chronic Has anxiety, mild depression at times - should get better after job transition Continue Wellbutrin XL 300 mg daily

## 2022-12-23 NOTE — Assessment & Plan Note (Addendum)
Chronic Not always controlled Sees Dr Loreta Ave Continue pepcid 40 mg in morning and omeprazole 40 mg daily before dinner GERD diet Working on weight loss

## 2022-12-23 NOTE — Assessment & Plan Note (Signed)
Chronic Elevated sugar in the past, obese, family history of diabetes Check A1c

## 2022-12-23 NOTE — Assessment & Plan Note (Addendum)
Chronic Has tried phentermine, topamax in the past - not effective ozempic was effective  - stopped being covered rybelsus - not as effective  Discussed increasing exercise Low sugar/carb diet, smaller portions Will look into other options for weight loss medications to help with weight loss

## 2022-12-23 NOTE — Assessment & Plan Note (Signed)
Chronic  Well controlled On allergy injections, allergy medications Albuterol inhaler as needed Management per Dr Dinosaur Callas

## 2022-12-26 LAB — ANA: Anti Nuclear Antibody (ANA): NEGATIVE

## 2022-12-26 LAB — HIV ANTIBODY (ROUTINE TESTING W REFLEX): HIV 1&2 Ab, 4th Generation: NONREACTIVE

## 2022-12-26 LAB — RHEUMATOID FACTOR: Rheumatoid fact SerPl-aCnc: <10 [IU]/mL (ref <14)

## 2022-12-26 LAB — HEPATITIS C ANTIBODY: Hepatitis C Ab: NONREACTIVE

## 2022-12-26 LAB — CYCLIC CITRUL PEPTIDE ANTIBODY, IGG: Cyclic Citrullin Peptide Ab: <16 U

## 2022-12-30 DIAGNOSIS — J3081 Allergic rhinitis due to animal (cat) (dog) hair and dander: Secondary | ICD-10-CM | POA: Diagnosis not present

## 2022-12-30 DIAGNOSIS — J301 Allergic rhinitis due to pollen: Secondary | ICD-10-CM | POA: Diagnosis not present

## 2022-12-30 DIAGNOSIS — J3089 Other allergic rhinitis: Secondary | ICD-10-CM | POA: Diagnosis not present

## 2023-01-07 ENCOUNTER — Other Ambulatory Visit (HOSPITAL_COMMUNITY): Payer: Self-pay

## 2023-01-07 ENCOUNTER — Other Ambulatory Visit: Payer: Self-pay

## 2023-01-07 ENCOUNTER — Other Ambulatory Visit: Payer: Self-pay | Admitting: Internal Medicine

## 2023-01-10 ENCOUNTER — Other Ambulatory Visit: Payer: Self-pay

## 2023-01-10 MED ORDER — BUPROPION HCL ER (XL) 300 MG PO TB24
300.0000 mg | ORAL_TABLET | Freq: Every day | ORAL | 0 refills | Status: DC
  Filled 2023-01-10: qty 30, 30d supply, fill #0

## 2023-01-10 MED ORDER — LEVOCETIRIZINE DIHYDROCHLORIDE 5 MG PO TABS
5.0000 mg | ORAL_TABLET | Freq: Every evening | ORAL | 0 refills | Status: DC
  Filled 2023-01-10: qty 30, 30d supply, fill #0

## 2023-01-10 MED ORDER — OMEPRAZOLE 40 MG PO CPDR
40.0000 mg | DELAYED_RELEASE_CAPSULE | Freq: Every day | ORAL | 2 refills | Status: DC
  Filled 2023-01-10: qty 60, 30d supply, fill #0
  Filled 2023-02-06: qty 60, 30d supply, fill #1
  Filled 2023-03-16: qty 60, 30d supply, fill #2

## 2023-01-21 DIAGNOSIS — J301 Allergic rhinitis due to pollen: Secondary | ICD-10-CM | POA: Diagnosis not present

## 2023-01-21 DIAGNOSIS — H1045 Other chronic allergic conjunctivitis: Secondary | ICD-10-CM | POA: Diagnosis not present

## 2023-01-21 DIAGNOSIS — J4520 Mild intermittent asthma, uncomplicated: Secondary | ICD-10-CM | POA: Diagnosis not present

## 2023-01-21 DIAGNOSIS — J3089 Other allergic rhinitis: Secondary | ICD-10-CM | POA: Diagnosis not present

## 2023-02-06 ENCOUNTER — Other Ambulatory Visit: Payer: Self-pay | Admitting: Internal Medicine

## 2023-02-06 ENCOUNTER — Other Ambulatory Visit (HOSPITAL_COMMUNITY): Payer: Self-pay

## 2023-02-07 ENCOUNTER — Other Ambulatory Visit: Payer: Self-pay

## 2023-02-07 ENCOUNTER — Other Ambulatory Visit (HOSPITAL_COMMUNITY): Payer: Self-pay

## 2023-02-07 MED ORDER — LEVOCETIRIZINE DIHYDROCHLORIDE 5 MG PO TABS
5.0000 mg | ORAL_TABLET | Freq: Every evening | ORAL | 0 refills | Status: DC
  Filled 2023-02-07: qty 30, 30d supply, fill #0

## 2023-02-07 MED ORDER — FAMOTIDINE 40 MG PO TABS
40.0000 mg | ORAL_TABLET | Freq: Every day | ORAL | 0 refills | Status: DC | PRN
  Filled 2023-02-07: qty 30, 30d supply, fill #0

## 2023-02-07 MED ORDER — BUPROPION HCL ER (XL) 300 MG PO TB24
300.0000 mg | ORAL_TABLET | Freq: Every day | ORAL | 0 refills | Status: DC
  Filled 2023-02-07: qty 30, 30d supply, fill #0

## 2023-02-09 ENCOUNTER — Ambulatory Visit: Payer: Commercial Managed Care - PPO

## 2023-02-09 ENCOUNTER — Telehealth: Payer: Commercial Managed Care - PPO | Admitting: Family Medicine

## 2023-02-09 DIAGNOSIS — J101 Influenza due to other identified influenza virus with other respiratory manifestations: Secondary | ICD-10-CM | POA: Diagnosis not present

## 2023-02-09 DIAGNOSIS — R509 Fever, unspecified: Secondary | ICD-10-CM

## 2023-02-09 DIAGNOSIS — R051 Acute cough: Secondary | ICD-10-CM | POA: Diagnosis not present

## 2023-02-09 DIAGNOSIS — R519 Headache, unspecified: Secondary | ICD-10-CM

## 2023-02-09 LAB — POCT RESPIRATORY SYNCYTIAL VIRUS
RSV Rapid Ag: NEGATIVE
RSV Rapid Ag: NEGATIVE

## 2023-02-09 LAB — POCT INFLUENZA A/B
Influenza A, POC: POSITIVE — AB
Influenza B, POC: NEGATIVE

## 2023-02-09 LAB — POC COVID19 BINAXNOW
SARS Coronavirus 2 Ag: NEGATIVE
SARS Coronavirus 2 Ag: NEGATIVE

## 2023-02-09 MED ORDER — OSELTAMIVIR PHOSPHATE 75 MG PO CAPS: 75.0000 mg | ORAL_CAPSULE | Freq: Two times a day (BID) | ORAL | 0 refills | Status: DC

## 2023-02-09 NOTE — Progress Notes (Signed)
MyChart Video Visit    Virtual Visit via Video Note    Patient location: Home. Patient and provider in visit Provider location: Office Video visit and in office for CMA to test her for Covid, flu and RSV  I discussed the limitations of evaluation and management by telemedicine and the availability of in person appointments. The patient expressed understanding and agreed to proceed.  Visit Date: 02/09/2023  Today's healthcare provider: Hetty Blend, NP-C     Subjective:    Patient ID: Colleen Schmitt, female    DOB: 05-17-1970, 53 y.o.   MRN: [id number]  Chief Complaint  Patient presents with   Cough    yesterday   Fever    101.4 today   Nasal Congestion    Runny nose sneezing   Sore Throat    yesterdat    Cough Associated symptoms include chills, a fever, headaches and myalgias. Pertinent negatives include no chest pain, shortness of breath or wheezing.  Fever  Associated symptoms include congestion, coughing and headaches. Pertinent negatives include no abdominal pain, chest pain, nausea, vomiting or wheezing.  Sore Throat  Associated symptoms include congestion, coughing and headaches. Pertinent negatives include no abdominal pain, shortness of breath or vomiting.    C/o of a 24 hour illness with symptoms including fever, chills, fatigue, headache, rhinorrhea, nasal congestion, dry cough.  Temp 101.4 Recent exposure to flu.   She has asthma. Denies shortness of breath, wheezing and chest pain.    Past Medical History:  Diagnosis Date   Abdominal pain    Anxiety    Asthma    Contact lens/glasses fitting    Cough    Depression    Fatigue    GERD (gastroesophageal reflux disease)    Hoarseness    Poor appetite     Past Surgical History:  Procedure Laterality Date   CARPAL TUNNEL RELEASE Right 05/21/2016   Procedure: CARPAL TUNNEL RELEASE;  Surgeon: Dominica Severin, MD;  Location: [facility];  Service: Orthopedics;  Laterality:  Right;  Requests 1 hr   CARPAL TUNNEL RELEASE Left 09/24/2016   Procedure: Limited open CARPAL TUNNEL RELEASE;  Surgeon: Dominica Severin, MD;  Location: [facility];  Service: Orthopedics;  Laterality: Left;   lap band  02/14/2006   NASAL SEPTOPLASTY W/ TURBINOPLASTY N/A 05/21/2016   Procedure: NASAL SEPTOPLASTY WITH TURBINATE REDUCTION;  Surgeon: Newman Pies, MD;  Location: [facility];  Service: ENT;  Laterality: N/A;   ORIF FEMUR FRACTURE  1993   right   SKIN BIOPSY      Family History  Problem Relation Age of Onset   Cancer Paternal Grandfather        throat   Osteoporosis Mother    Alzheimer's disease Mother    Hypertension Father    Atrial fibrillation Father    Hypertension Sister    Mitral valve prolapse Sister    Alzheimer's disease Maternal Grandfather     Social History   Socioeconomic History   Marital status: Single    Spouse name: Not on file   Number of children: Not on file   Years of education: Not on file   Highest education level: Not on file  Occupational History   Not on file  Tobacco Use   Smoking status: Never   Smokeless tobacco: Never  Vaping Use   Vaping status: Never Used  Substance and Sexual Activity   Alcohol use: Yes    Comment: socially   Drug  use: No   Sexual activity: Not Currently    Birth control/protection: Abstinence    Comment: pt has not been sexually active since lmp. 05/14/16 JL  Other Topics Concern   Not on file  Social History Narrative   Not on file   Social Drivers of Health   Financial Resource Strain: Not on file  Food Insecurity: Not on file  Transportation Needs: Not on file  Physical Activity: Not on file  Stress: Not on file  Social Connections: Not on file  Intimate Partner Violence: Not on file    Outpatient Medications Prior to Visit  Medication Sig Dispense Refill   albuterol (VENTOLIN HFA) 108 (90 Base) MCG/ACT inhaler Inhale 2 puffs into the lungs every 6 (six) hours as needed  for wheezing or shortness of breath. 6.7 g 0   buPROPion (WELLBUTRIN XL) 300 MG 24 hr tablet Take 1 tablet (300 mg total) by mouth daily. 30 tablet 0   Cholecalciferol (VITAMIN D3) 125 MCG (5000 UT) TABS Take 1 tablet by mouth daily.     EPINEPHrine (EPIPEN 2-PAK) 0.3 mg/0.3 mL IJ SOAJ injection inject as directed if needed for allergic reaction 2 each 1   famotidine (PEPCID) 40 MG tablet Take 1 tablet (40 mg total) by mouth daily for heartburn 30 tablet 0   levocetirizine (XYZAL) 5 MG tablet Take 1 tablet (5 mg total) by mouth every evening. 30 tablet 0   mometasone (NASONEX) 50 MCG/ACT nasal spray Place 1-2 sprays into each nostril daily. 17 g 3   montelukast (SINGULAIR) 10 MG tablet Take 1 tablet (10 mg total) by mouth at bedtime. 30 tablet 3   Multiple Vitamin (MULTIVITAMIN) tablet Take by mouth.     Omega-3 Fatty Acids (OMEGA 3 500 PO) Take 1 capsule by mouth daily.     omeprazole (PRILOSEC) 40 MG capsule Take 1 capsule (40 mg total) by mouth daily and 2 times per day when needed 30 minutes before meals. 60 capsule 2   Probiotic Product (PROBIOTIC DAILY) CAPS Take 1 capsule by mouth daily.     triamcinolone (NASACORT) 55 MCG/ACT AERO nasal inhaler Place into the nose.     Zinc 30 MG CAPS Take 1 capsule by mouth daily.     No facility-administered medications prior to visit.    Allergies  Allergen Reactions   Adhesive [Tape] Rash   Penicillins Itching    Palms of hands    Review of Systems  Constitutional:  Positive for chills, fever and malaise/fatigue.  HENT:  Positive for congestion.   Respiratory:  Positive for cough. Negative for shortness of breath and wheezing.   Cardiovascular:  Negative for chest pain and palpitations.  Gastrointestinal:  Negative for abdominal pain, nausea and vomiting.  Musculoskeletal:  Positive for myalgias.  Neurological:  Positive for headaches. Negative for dizziness and focal weakness.       Objective:    Physical Exam Constitutional:       General: She is not in acute distress.    Appearance: She is not ill-appearing.  Pulmonary:     Effort: Pulmonary effort is normal.  Musculoskeletal:     Cervical back: Normal range of motion.  Neurological:     Mental Status: She is alert and oriented to person, place, and time.  Psychiatric:        Mood and Affect: Mood normal.     There were no vitals taken for this visit. Wt Readings from Last 3 Encounters:  12/23/22 210 lb (95.3 kg)  11/11/22 195 lb (88.5 kg)  08/27/22 197 lb 9.6 oz (89.6 kg)       Assessment & Plan:   Problem List Items Addressed This Visit   None Visit Diagnoses       Influenza A    -  Primary   Relevant Medications   oseltamivir (TAMIFLU) 75 MG capsule     Fever and chills       Relevant Orders   POC COVID-19 BinaxNow (Completed)   POCT Influenza A/B (Completed)   POCT respiratory syncytial virus (Completed)     Acute nonintractable headache, unspecified headache type       Relevant Orders   POC COVID-19 BinaxNow (Completed)   POCT Influenza A/B (Completed)   POCT respiratory syncytial virus (Completed)     Acute cough       Relevant Orders   POC COVID-19 BinaxNow (Completed)   POCT Influenza A/B (Completed)   POCT respiratory syncytial virus (Completed)      Video visit done.  Patient will also come into the office for COVID, flu and RSV testing.  She will leave once the swabs are done.  We will follow-up with results and treatment plan.  She is aware that if she test negative for COVID and flu that we will need to do symptomatic treatment. Addendum: positive for influenza A. Tamiflu prescribed.   I am having [name] start on oseltamivir. I am also having her maintain her Omega-3 Fatty Acids (OMEGA 3 500 PO), Probiotic Daily, Zinc, Vitamin D3, multivitamin, mometasone, albuterol, EPINEPHrine, triamcinolone, montelukast, omeprazole, famotidine, levocetirizine, and buPROPion.  Meds ordered this encounter  Medications    oseltamivir (TAMIFLU) 75 MG capsule    Sig: Take 1 capsule (75 mg total) by mouth 2 (two) times daily.    Dispense:  10 capsule    Refill:  0    Supervising Provider:   Hillard Danker A [[id number]]    I discussed the assessment and treatment plan with the patient. The patient was provided an opportunity to ask questions and all were answered. The patient agreed with the plan and demonstrated an understanding of the instructions.   The patient was advised to call back or seek an in-person evaluation if the symptoms worsen or if the condition fails to improve as anticipated.    Hetty Blend, NP-C [facility] [phone] (phone) [phone] (fax)  [facility]

## 2023-02-09 NOTE — Progress Notes (Signed)
Pt has been test for COVID, RSV and Influenza. Pts has tested POS for Influenza A.  Her Covid and RSV were both Negative a/w Flu B.

## 2023-02-10 MED ORDER — TRIAMCINOLONE ACETONIDE 55 MCG/ACT NA AERO
1.0000 | INHALATION_SPRAY | Freq: Every day | NASAL | 11 refills | Status: AC
  Filled 2023-02-10: qty 16.9, 60d supply, fill #0
  Filled 2023-04-08: qty 16.9, 60d supply, fill #1
  Filled 2023-07-11: qty 16.9, 60d supply, fill #2

## 2023-02-11 ENCOUNTER — Other Ambulatory Visit: Payer: Self-pay

## 2023-02-11 ENCOUNTER — Other Ambulatory Visit (HOSPITAL_COMMUNITY): Payer: Self-pay

## 2023-02-14 ENCOUNTER — Other Ambulatory Visit (HOSPITAL_COMMUNITY): Payer: Self-pay

## 2023-02-14 DIAGNOSIS — Z124 Encounter for screening for malignant neoplasm of cervix: Secondary | ICD-10-CM | POA: Diagnosis not present

## 2023-02-14 DIAGNOSIS — Z1151 Encounter for screening for human papillomavirus (HPV): Secondary | ICD-10-CM | POA: Diagnosis not present

## 2023-02-14 DIAGNOSIS — Z6832 Body mass index (BMI) 32.0-32.9, adult: Secondary | ICD-10-CM | POA: Diagnosis not present

## 2023-02-14 DIAGNOSIS — Z1231 Encounter for screening mammogram for malignant neoplasm of breast: Secondary | ICD-10-CM | POA: Diagnosis not present

## 2023-02-14 DIAGNOSIS — Z01419 Encounter for gynecological examination (general) (routine) without abnormal findings: Secondary | ICD-10-CM | POA: Diagnosis not present

## 2023-02-14 MED ORDER — PROGESTERONE MICRONIZED 100 MG PO CAPS
100.0000 mg | ORAL_CAPSULE | Freq: Every day | ORAL | 1 refills | Status: DC
  Filled 2023-02-14: qty 90, 90d supply, fill #0
  Filled 2023-04-08 – 2023-05-03 (×3): qty 90, 90d supply, fill #1

## 2023-02-14 MED ORDER — ESTRADIOL 0.05 MG/24HR TD PTTW
1.0000 | MEDICATED_PATCH | TRANSDERMAL | 1 refills | Status: DC
  Filled 2023-02-14: qty 24, 84d supply, fill #0
  Filled 2023-04-08 – 2023-05-03 (×3): qty 24, 84d supply, fill #1

## 2023-02-15 ENCOUNTER — Other Ambulatory Visit: Payer: Self-pay

## 2023-02-17 DIAGNOSIS — J3081 Allergic rhinitis due to animal (cat) (dog) hair and dander: Secondary | ICD-10-CM | POA: Diagnosis not present

## 2023-02-17 DIAGNOSIS — J301 Allergic rhinitis due to pollen: Secondary | ICD-10-CM | POA: Diagnosis not present

## 2023-02-17 DIAGNOSIS — J3089 Other allergic rhinitis: Secondary | ICD-10-CM | POA: Diagnosis not present

## 2023-02-24 DIAGNOSIS — J3081 Allergic rhinitis due to animal (cat) (dog) hair and dander: Secondary | ICD-10-CM | POA: Diagnosis not present

## 2023-02-24 DIAGNOSIS — J301 Allergic rhinitis due to pollen: Secondary | ICD-10-CM | POA: Diagnosis not present

## 2023-02-24 DIAGNOSIS — J3089 Other allergic rhinitis: Secondary | ICD-10-CM | POA: Diagnosis not present

## 2023-03-03 DIAGNOSIS — J3081 Allergic rhinitis due to animal (cat) (dog) hair and dander: Secondary | ICD-10-CM | POA: Diagnosis not present

## 2023-03-03 DIAGNOSIS — J3089 Other allergic rhinitis: Secondary | ICD-10-CM | POA: Diagnosis not present

## 2023-03-03 DIAGNOSIS — J301 Allergic rhinitis due to pollen: Secondary | ICD-10-CM | POA: Diagnosis not present

## 2023-03-09 DIAGNOSIS — J3089 Other allergic rhinitis: Secondary | ICD-10-CM | POA: Diagnosis not present

## 2023-03-09 DIAGNOSIS — J3081 Allergic rhinitis due to animal (cat) (dog) hair and dander: Secondary | ICD-10-CM | POA: Diagnosis not present

## 2023-03-09 DIAGNOSIS — J301 Allergic rhinitis due to pollen: Secondary | ICD-10-CM | POA: Diagnosis not present

## 2023-03-16 ENCOUNTER — Other Ambulatory Visit: Payer: Self-pay | Admitting: Internal Medicine

## 2023-03-16 ENCOUNTER — Other Ambulatory Visit: Payer: Self-pay

## 2023-03-16 ENCOUNTER — Other Ambulatory Visit (HOSPITAL_COMMUNITY): Payer: Self-pay

## 2023-03-16 MED ORDER — LEVOCETIRIZINE DIHYDROCHLORIDE 5 MG PO TABS
5.0000 mg | ORAL_TABLET | Freq: Every evening | ORAL | 0 refills | Status: DC
  Filled 2023-03-16: qty 30, 30d supply, fill #0

## 2023-03-16 MED ORDER — BUPROPION HCL ER (XL) 300 MG PO TB24
300.0000 mg | ORAL_TABLET | Freq: Every day | ORAL | 0 refills | Status: DC
  Filled 2023-03-16: qty 30, 30d supply, fill #0

## 2023-03-25 DIAGNOSIS — J3081 Allergic rhinitis due to animal (cat) (dog) hair and dander: Secondary | ICD-10-CM | POA: Diagnosis not present

## 2023-03-25 DIAGNOSIS — J3089 Other allergic rhinitis: Secondary | ICD-10-CM | POA: Diagnosis not present

## 2023-03-25 DIAGNOSIS — J301 Allergic rhinitis due to pollen: Secondary | ICD-10-CM | POA: Diagnosis not present

## 2023-03-30 DIAGNOSIS — H5213 Myopia, bilateral: Secondary | ICD-10-CM | POA: Diagnosis not present

## 2023-04-06 DIAGNOSIS — J3089 Other allergic rhinitis: Secondary | ICD-10-CM | POA: Diagnosis not present

## 2023-04-06 DIAGNOSIS — J3081 Allergic rhinitis due to animal (cat) (dog) hair and dander: Secondary | ICD-10-CM | POA: Diagnosis not present

## 2023-04-06 DIAGNOSIS — J301 Allergic rhinitis due to pollen: Secondary | ICD-10-CM | POA: Diagnosis not present

## 2023-04-08 ENCOUNTER — Other Ambulatory Visit: Payer: Self-pay

## 2023-04-08 ENCOUNTER — Other Ambulatory Visit (HOSPITAL_COMMUNITY): Payer: Self-pay

## 2023-04-08 ENCOUNTER — Other Ambulatory Visit: Payer: Self-pay | Admitting: Internal Medicine

## 2023-04-08 MED ORDER — MONTELUKAST SODIUM 10 MG PO TABS
10.0000 mg | ORAL_TABLET | Freq: Every day | ORAL | 6 refills | Status: DC
  Filled 2023-04-08 – 2023-04-18 (×2): qty 30, 30d supply, fill #0
  Filled 2023-05-17: qty 30, 30d supply, fill #1
  Filled 2023-06-08 – 2023-06-10 (×2): qty 30, 30d supply, fill #2
  Filled 2023-07-27: qty 30, 30d supply, fill #3
  Filled 2023-08-23: qty 30, 30d supply, fill #4
  Filled 2023-09-29 (×2): qty 30, 30d supply, fill #5
  Filled 2023-10-24 – 2023-10-26 (×2): qty 30, 30d supply, fill #6
  Filled ????-??-??: fill #0

## 2023-04-10 MED ORDER — LEVOCETIRIZINE DIHYDROCHLORIDE 5 MG PO TABS
5.0000 mg | ORAL_TABLET | Freq: Every evening | ORAL | 1 refills | Status: DC
  Filled 2023-04-10: qty 90, 90d supply, fill #0
  Filled 2023-05-17 – 2023-07-11 (×3): qty 90, 90d supply, fill #1

## 2023-04-10 MED ORDER — BUPROPION HCL ER (XL) 300 MG PO TB24
300.0000 mg | ORAL_TABLET | Freq: Every day | ORAL | 1 refills | Status: DC
  Filled 2023-04-10: qty 90, 90d supply, fill #0
  Filled 2023-05-17 – 2023-07-11 (×3): qty 90, 90d supply, fill #1

## 2023-04-11 ENCOUNTER — Other Ambulatory Visit: Payer: Self-pay

## 2023-04-12 ENCOUNTER — Other Ambulatory Visit: Payer: Self-pay

## 2023-04-15 DIAGNOSIS — J3081 Allergic rhinitis due to animal (cat) (dog) hair and dander: Secondary | ICD-10-CM | POA: Diagnosis not present

## 2023-04-15 DIAGNOSIS — J3089 Other allergic rhinitis: Secondary | ICD-10-CM | POA: Diagnosis not present

## 2023-04-15 DIAGNOSIS — J301 Allergic rhinitis due to pollen: Secondary | ICD-10-CM | POA: Diagnosis not present

## 2023-04-17 ENCOUNTER — Other Ambulatory Visit: Payer: Self-pay | Admitting: Internal Medicine

## 2023-04-18 ENCOUNTER — Other Ambulatory Visit: Payer: Self-pay | Admitting: Internal Medicine

## 2023-04-18 ENCOUNTER — Other Ambulatory Visit (HOSPITAL_COMMUNITY): Payer: Self-pay

## 2023-04-18 DIAGNOSIS — U071 COVID-19: Secondary | ICD-10-CM

## 2023-04-18 MED ORDER — FAMOTIDINE 40 MG PO TABS
40.0000 mg | ORAL_TABLET | Freq: Every day | ORAL | 0 refills | Status: DC | PRN
  Filled 2023-04-18: qty 30, 30d supply, fill #0

## 2023-04-19 ENCOUNTER — Other Ambulatory Visit (HOSPITAL_COMMUNITY): Payer: Self-pay

## 2023-04-19 ENCOUNTER — Other Ambulatory Visit: Payer: Self-pay

## 2023-04-19 MED ORDER — ALBUTEROL SULFATE HFA 108 (90 BASE) MCG/ACT IN AERS
2.0000 | INHALATION_SPRAY | Freq: Four times a day (QID) | RESPIRATORY_TRACT | 0 refills | Status: DC | PRN
  Filled 2023-04-19: qty 6.7, 25d supply, fill #0

## 2023-04-20 ENCOUNTER — Other Ambulatory Visit (HOSPITAL_COMMUNITY): Payer: Self-pay

## 2023-04-20 DIAGNOSIS — Z6833 Body mass index (BMI) 33.0-33.9, adult: Secondary | ICD-10-CM | POA: Diagnosis not present

## 2023-04-20 DIAGNOSIS — Z713 Dietary counseling and surveillance: Secondary | ICD-10-CM | POA: Diagnosis not present

## 2023-04-20 MED ORDER — ZEPBOUND 2.5 MG/0.5ML ~~LOC~~ SOAJ
2.5000 mg | SUBCUTANEOUS | 3 refills | Status: DC
  Filled 2023-04-20 – 2023-04-21 (×3): qty 2, 28d supply, fill #0

## 2023-04-21 ENCOUNTER — Other Ambulatory Visit (HOSPITAL_COMMUNITY): Payer: Self-pay

## 2023-05-04 ENCOUNTER — Other Ambulatory Visit (HOSPITAL_COMMUNITY): Payer: Self-pay

## 2023-05-09 DIAGNOSIS — J3089 Other allergic rhinitis: Secondary | ICD-10-CM | POA: Diagnosis not present

## 2023-05-09 DIAGNOSIS — J3081 Allergic rhinitis due to animal (cat) (dog) hair and dander: Secondary | ICD-10-CM | POA: Diagnosis not present

## 2023-05-09 DIAGNOSIS — J301 Allergic rhinitis due to pollen: Secondary | ICD-10-CM | POA: Diagnosis not present

## 2023-05-16 ENCOUNTER — Ambulatory Visit (INDEPENDENT_AMBULATORY_CARE_PROVIDER_SITE_OTHER): Payer: 59

## 2023-05-17 ENCOUNTER — Other Ambulatory Visit: Payer: Self-pay

## 2023-05-17 ENCOUNTER — Other Ambulatory Visit (HOSPITAL_COMMUNITY): Payer: Self-pay

## 2023-05-23 DIAGNOSIS — E669 Obesity, unspecified: Secondary | ICD-10-CM | POA: Diagnosis not present

## 2023-05-23 DIAGNOSIS — Z6833 Body mass index (BMI) 33.0-33.9, adult: Secondary | ICD-10-CM | POA: Diagnosis not present

## 2023-05-23 DIAGNOSIS — Z713 Dietary counseling and surveillance: Secondary | ICD-10-CM | POA: Diagnosis not present

## 2023-05-24 DIAGNOSIS — J301 Allergic rhinitis due to pollen: Secondary | ICD-10-CM | POA: Diagnosis not present

## 2023-05-24 DIAGNOSIS — J3089 Other allergic rhinitis: Secondary | ICD-10-CM | POA: Diagnosis not present

## 2023-05-24 DIAGNOSIS — J3081 Allergic rhinitis due to animal (cat) (dog) hair and dander: Secondary | ICD-10-CM | POA: Diagnosis not present

## 2023-06-02 ENCOUNTER — Other Ambulatory Visit: Payer: Self-pay

## 2023-06-02 ENCOUNTER — Other Ambulatory Visit (HOSPITAL_COMMUNITY): Payer: Self-pay

## 2023-06-02 ENCOUNTER — Other Ambulatory Visit: Payer: Self-pay | Admitting: Internal Medicine

## 2023-06-02 MED ORDER — FAMOTIDINE 40 MG PO TABS
40.0000 mg | ORAL_TABLET | Freq: Every day | ORAL | 0 refills | Status: DC | PRN
  Filled 2023-06-02: qty 30, 30d supply, fill #0

## 2023-06-03 ENCOUNTER — Other Ambulatory Visit: Payer: Self-pay

## 2023-06-09 ENCOUNTER — Other Ambulatory Visit (HOSPITAL_COMMUNITY): Payer: Self-pay

## 2023-06-10 ENCOUNTER — Other Ambulatory Visit: Payer: Self-pay

## 2023-06-10 ENCOUNTER — Other Ambulatory Visit (HOSPITAL_COMMUNITY): Payer: Self-pay

## 2023-06-10 ENCOUNTER — Other Ambulatory Visit: Payer: Self-pay | Admitting: Internal Medicine

## 2023-06-10 MED ORDER — FAMOTIDINE 40 MG PO TABS
40.0000 mg | ORAL_TABLET | Freq: Every day | ORAL | 0 refills | Status: DC | PRN
  Filled 2023-06-10 – 2023-06-29 (×2): qty 30, 30d supply, fill #0

## 2023-06-10 MED ORDER — OMEPRAZOLE 40 MG PO CPDR
40.0000 mg | DELAYED_RELEASE_CAPSULE | Freq: Every day | ORAL | 2 refills | Status: DC
  Filled 2023-06-10: qty 60, 30d supply, fill #0
  Filled 2023-08-23: qty 60, 30d supply, fill #1
  Filled 2023-09-29 (×2): qty 60, 30d supply, fill #2

## 2023-06-13 ENCOUNTER — Other Ambulatory Visit: Payer: Self-pay | Admitting: Medical Genetics

## 2023-06-13 DIAGNOSIS — Z006 Encounter for examination for normal comparison and control in clinical research program: Secondary | ICD-10-CM

## 2023-06-15 DIAGNOSIS — J3089 Other allergic rhinitis: Secondary | ICD-10-CM | POA: Diagnosis not present

## 2023-06-15 DIAGNOSIS — J3081 Allergic rhinitis due to animal (cat) (dog) hair and dander: Secondary | ICD-10-CM | POA: Diagnosis not present

## 2023-06-15 DIAGNOSIS — J301 Allergic rhinitis due to pollen: Secondary | ICD-10-CM | POA: Diagnosis not present

## 2023-06-28 DIAGNOSIS — J301 Allergic rhinitis due to pollen: Secondary | ICD-10-CM | POA: Diagnosis not present

## 2023-06-28 DIAGNOSIS — J3089 Other allergic rhinitis: Secondary | ICD-10-CM | POA: Diagnosis not present

## 2023-06-28 DIAGNOSIS — J3081 Allergic rhinitis due to animal (cat) (dog) hair and dander: Secondary | ICD-10-CM | POA: Diagnosis not present

## 2023-06-29 ENCOUNTER — Other Ambulatory Visit (HOSPITAL_COMMUNITY): Payer: Self-pay

## 2023-06-29 ENCOUNTER — Other Ambulatory Visit: Payer: Self-pay

## 2023-07-01 LAB — GENECONNECT MOLECULAR SCREEN: Genetic Analysis Overall Interpretation: NEGATIVE

## 2023-07-04 DIAGNOSIS — J301 Allergic rhinitis due to pollen: Secondary | ICD-10-CM | POA: Diagnosis not present

## 2023-07-04 DIAGNOSIS — J3081 Allergic rhinitis due to animal (cat) (dog) hair and dander: Secondary | ICD-10-CM | POA: Diagnosis not present

## 2023-07-04 DIAGNOSIS — J3089 Other allergic rhinitis: Secondary | ICD-10-CM | POA: Diagnosis not present

## 2023-07-11 ENCOUNTER — Other Ambulatory Visit: Payer: Self-pay | Admitting: Internal Medicine

## 2023-07-11 DIAGNOSIS — U071 COVID-19: Secondary | ICD-10-CM

## 2023-07-12 ENCOUNTER — Other Ambulatory Visit (HOSPITAL_COMMUNITY): Payer: Self-pay

## 2023-07-12 ENCOUNTER — Other Ambulatory Visit: Payer: Self-pay

## 2023-07-12 MED ORDER — FAMOTIDINE 40 MG PO TABS
40.0000 mg | ORAL_TABLET | Freq: Every day | ORAL | 0 refills | Status: DC | PRN
  Filled 2023-07-12 – 2023-08-23 (×4): qty 30, 30d supply, fill #0

## 2023-07-12 MED ORDER — ALBUTEROL SULFATE HFA 108 (90 BASE) MCG/ACT IN AERS
2.0000 | INHALATION_SPRAY | Freq: Four times a day (QID) | RESPIRATORY_TRACT | 0 refills | Status: DC | PRN
  Filled 2023-07-12: qty 6.7, 25d supply, fill #0

## 2023-07-14 ENCOUNTER — Other Ambulatory Visit (HOSPITAL_COMMUNITY): Payer: Self-pay

## 2023-07-14 DIAGNOSIS — J3089 Other allergic rhinitis: Secondary | ICD-10-CM | POA: Diagnosis not present

## 2023-07-14 DIAGNOSIS — J301 Allergic rhinitis due to pollen: Secondary | ICD-10-CM | POA: Diagnosis not present

## 2023-07-14 DIAGNOSIS — J3081 Allergic rhinitis due to animal (cat) (dog) hair and dander: Secondary | ICD-10-CM | POA: Diagnosis not present

## 2023-07-20 DIAGNOSIS — E66811 Obesity, class 1: Secondary | ICD-10-CM | POA: Diagnosis not present

## 2023-07-20 DIAGNOSIS — Z6832 Body mass index (BMI) 32.0-32.9, adult: Secondary | ICD-10-CM | POA: Diagnosis not present

## 2023-07-22 DIAGNOSIS — J301 Allergic rhinitis due to pollen: Secondary | ICD-10-CM | POA: Diagnosis not present

## 2023-07-22 DIAGNOSIS — J3089 Other allergic rhinitis: Secondary | ICD-10-CM | POA: Diagnosis not present

## 2023-07-22 DIAGNOSIS — J3081 Allergic rhinitis due to animal (cat) (dog) hair and dander: Secondary | ICD-10-CM | POA: Diagnosis not present

## 2023-07-27 ENCOUNTER — Other Ambulatory Visit (HOSPITAL_COMMUNITY): Payer: Self-pay

## 2023-07-27 ENCOUNTER — Other Ambulatory Visit: Payer: Self-pay

## 2023-07-27 MED ORDER — ESTRADIOL 0.05 MG/24HR TD PTTW
1.0000 | MEDICATED_PATCH | TRANSDERMAL | 1 refills | Status: DC
  Filled 2023-07-27: qty 24, 84d supply, fill #0
  Filled 2023-09-06 – 2023-10-26 (×3): qty 24, 84d supply, fill #1

## 2023-07-27 MED ORDER — PROGESTERONE MICRONIZED 100 MG PO CAPS
100.0000 mg | ORAL_CAPSULE | Freq: Every day | ORAL | 1 refills | Status: DC
  Filled 2023-07-27: qty 90, 90d supply, fill #0
  Filled 2023-09-06 – 2023-10-24 (×2): qty 90, 90d supply, fill #1
  Filled 2023-10-26: qty 82, 82d supply, fill #1
  Filled 2023-10-26: qty 8, 8d supply, fill #1

## 2023-07-29 DIAGNOSIS — J301 Allergic rhinitis due to pollen: Secondary | ICD-10-CM | POA: Diagnosis not present

## 2023-07-29 DIAGNOSIS — J3089 Other allergic rhinitis: Secondary | ICD-10-CM | POA: Diagnosis not present

## 2023-07-29 DIAGNOSIS — J3081 Allergic rhinitis due to animal (cat) (dog) hair and dander: Secondary | ICD-10-CM | POA: Diagnosis not present

## 2023-08-04 DIAGNOSIS — J301 Allergic rhinitis due to pollen: Secondary | ICD-10-CM | POA: Diagnosis not present

## 2023-08-04 DIAGNOSIS — J3089 Other allergic rhinitis: Secondary | ICD-10-CM | POA: Diagnosis not present

## 2023-08-04 DIAGNOSIS — J3081 Allergic rhinitis due to animal (cat) (dog) hair and dander: Secondary | ICD-10-CM | POA: Diagnosis not present

## 2023-08-16 DIAGNOSIS — J301 Allergic rhinitis due to pollen: Secondary | ICD-10-CM | POA: Diagnosis not present

## 2023-08-16 DIAGNOSIS — J3081 Allergic rhinitis due to animal (cat) (dog) hair and dander: Secondary | ICD-10-CM | POA: Diagnosis not present

## 2023-08-16 DIAGNOSIS — J3089 Other allergic rhinitis: Secondary | ICD-10-CM | POA: Diagnosis not present

## 2023-08-23 DIAGNOSIS — J301 Allergic rhinitis due to pollen: Secondary | ICD-10-CM | POA: Diagnosis not present

## 2023-08-23 DIAGNOSIS — J3081 Allergic rhinitis due to animal (cat) (dog) hair and dander: Secondary | ICD-10-CM | POA: Diagnosis not present

## 2023-08-23 DIAGNOSIS — J3089 Other allergic rhinitis: Secondary | ICD-10-CM | POA: Diagnosis not present

## 2023-08-24 ENCOUNTER — Other Ambulatory Visit (HOSPITAL_COMMUNITY): Payer: Self-pay

## 2023-08-24 ENCOUNTER — Other Ambulatory Visit: Payer: Self-pay

## 2023-09-05 DIAGNOSIS — Z6833 Body mass index (BMI) 33.0-33.9, adult: Secondary | ICD-10-CM | POA: Diagnosis not present

## 2023-09-05 DIAGNOSIS — Z713 Dietary counseling and surveillance: Secondary | ICD-10-CM | POA: Diagnosis not present

## 2023-09-06 ENCOUNTER — Other Ambulatory Visit: Payer: Self-pay

## 2023-09-06 ENCOUNTER — Other Ambulatory Visit (HOSPITAL_COMMUNITY): Payer: Self-pay

## 2023-09-06 ENCOUNTER — Other Ambulatory Visit: Payer: Self-pay | Admitting: Internal Medicine

## 2023-09-06 DIAGNOSIS — U071 COVID-19: Secondary | ICD-10-CM

## 2023-09-06 MED ORDER — ALBUTEROL SULFATE HFA 108 (90 BASE) MCG/ACT IN AERS
2.0000 | INHALATION_SPRAY | Freq: Four times a day (QID) | RESPIRATORY_TRACT | 0 refills | Status: AC | PRN
  Filled 2023-09-06: qty 6.7, 25d supply, fill #0

## 2023-09-06 MED ORDER — FAMOTIDINE 40 MG PO TABS
40.0000 mg | ORAL_TABLET | Freq: Every day | ORAL | 0 refills | Status: DC | PRN
  Filled 2023-09-06 – 2023-09-29 (×3): qty 30, 30d supply, fill #0

## 2023-09-09 DIAGNOSIS — J3081 Allergic rhinitis due to animal (cat) (dog) hair and dander: Secondary | ICD-10-CM | POA: Diagnosis not present

## 2023-09-09 DIAGNOSIS — J3089 Other allergic rhinitis: Secondary | ICD-10-CM | POA: Diagnosis not present

## 2023-09-09 DIAGNOSIS — J301 Allergic rhinitis due to pollen: Secondary | ICD-10-CM | POA: Diagnosis not present

## 2023-09-14 DIAGNOSIS — J3089 Other allergic rhinitis: Secondary | ICD-10-CM | POA: Diagnosis not present

## 2023-09-14 DIAGNOSIS — J301 Allergic rhinitis due to pollen: Secondary | ICD-10-CM | POA: Diagnosis not present

## 2023-09-14 DIAGNOSIS — J3081 Allergic rhinitis due to animal (cat) (dog) hair and dander: Secondary | ICD-10-CM | POA: Diagnosis not present

## 2023-09-28 DIAGNOSIS — J3089 Other allergic rhinitis: Secondary | ICD-10-CM | POA: Diagnosis not present

## 2023-09-28 DIAGNOSIS — J3081 Allergic rhinitis due to animal (cat) (dog) hair and dander: Secondary | ICD-10-CM | POA: Diagnosis not present

## 2023-09-28 DIAGNOSIS — J301 Allergic rhinitis due to pollen: Secondary | ICD-10-CM | POA: Diagnosis not present

## 2023-09-29 ENCOUNTER — Other Ambulatory Visit: Payer: Self-pay

## 2023-09-29 ENCOUNTER — Other Ambulatory Visit (HOSPITAL_COMMUNITY): Payer: Self-pay

## 2023-10-14 DIAGNOSIS — J301 Allergic rhinitis due to pollen: Secondary | ICD-10-CM | POA: Diagnosis not present

## 2023-10-14 DIAGNOSIS — J3081 Allergic rhinitis due to animal (cat) (dog) hair and dander: Secondary | ICD-10-CM | POA: Diagnosis not present

## 2023-10-14 DIAGNOSIS — J3089 Other allergic rhinitis: Secondary | ICD-10-CM | POA: Diagnosis not present

## 2023-10-17 DIAGNOSIS — Z713 Dietary counseling and surveillance: Secondary | ICD-10-CM | POA: Diagnosis not present

## 2023-10-17 DIAGNOSIS — Z6833 Body mass index (BMI) 33.0-33.9, adult: Secondary | ICD-10-CM | POA: Diagnosis not present

## 2023-10-24 ENCOUNTER — Other Ambulatory Visit: Payer: Self-pay

## 2023-10-24 ENCOUNTER — Other Ambulatory Visit (HOSPITAL_COMMUNITY): Payer: Self-pay

## 2023-10-24 ENCOUNTER — Other Ambulatory Visit: Payer: Self-pay | Admitting: Internal Medicine

## 2023-10-24 MED ORDER — LEVOCETIRIZINE DIHYDROCHLORIDE 5 MG PO TABS
5.0000 mg | ORAL_TABLET | Freq: Every evening | ORAL | 1 refills | Status: AC
  Filled 2023-10-24 – 2023-10-26 (×2): qty 90, 90d supply, fill #0
  Filled 2023-11-22 – 2024-01-19 (×2): qty 90, 90d supply, fill #1

## 2023-10-24 MED ORDER — FAMOTIDINE 40 MG PO TABS
40.0000 mg | ORAL_TABLET | Freq: Every day | ORAL | 0 refills | Status: DC | PRN
  Filled 2023-10-24 – 2023-10-26 (×2): qty 30, 30d supply, fill #0

## 2023-10-24 MED ORDER — BUPROPION HCL ER (XL) 300 MG PO TB24
300.0000 mg | ORAL_TABLET | Freq: Every day | ORAL | 1 refills | Status: AC
  Filled 2023-10-24 – 2023-10-26 (×2): qty 90, 90d supply, fill #0
  Filled 2023-11-22 – 2024-01-19 (×3): qty 90, 90d supply, fill #1

## 2023-10-25 DIAGNOSIS — J3081 Allergic rhinitis due to animal (cat) (dog) hair and dander: Secondary | ICD-10-CM | POA: Diagnosis not present

## 2023-10-25 DIAGNOSIS — J3089 Other allergic rhinitis: Secondary | ICD-10-CM | POA: Diagnosis not present

## 2023-10-25 DIAGNOSIS — J301 Allergic rhinitis due to pollen: Secondary | ICD-10-CM | POA: Diagnosis not present

## 2023-10-26 ENCOUNTER — Other Ambulatory Visit (HOSPITAL_COMMUNITY): Payer: Self-pay

## 2023-10-26 ENCOUNTER — Other Ambulatory Visit: Payer: Self-pay

## 2023-10-27 ENCOUNTER — Other Ambulatory Visit: Payer: Self-pay

## 2023-11-06 ENCOUNTER — Other Ambulatory Visit (HOSPITAL_COMMUNITY): Payer: Self-pay

## 2023-11-07 DIAGNOSIS — J301 Allergic rhinitis due to pollen: Secondary | ICD-10-CM | POA: Diagnosis not present

## 2023-11-07 DIAGNOSIS — J3089 Other allergic rhinitis: Secondary | ICD-10-CM | POA: Diagnosis not present

## 2023-11-09 ENCOUNTER — Other Ambulatory Visit (HOSPITAL_COMMUNITY): Payer: Self-pay

## 2023-11-09 MED ORDER — FLUZONE 0.5 ML IM SUSY
0.5000 mL | PREFILLED_SYRINGE | Freq: Once | INTRAMUSCULAR | 0 refills | Status: AC
  Filled 2023-11-09: qty 0.5, 1d supply, fill #0

## 2023-11-21 DIAGNOSIS — J3081 Allergic rhinitis due to animal (cat) (dog) hair and dander: Secondary | ICD-10-CM | POA: Diagnosis not present

## 2023-11-21 DIAGNOSIS — J301 Allergic rhinitis due to pollen: Secondary | ICD-10-CM | POA: Diagnosis not present

## 2023-11-21 DIAGNOSIS — J3089 Other allergic rhinitis: Secondary | ICD-10-CM | POA: Diagnosis not present

## 2023-11-22 ENCOUNTER — Other Ambulatory Visit (HOSPITAL_COMMUNITY): Payer: Self-pay

## 2023-11-22 ENCOUNTER — Other Ambulatory Visit: Payer: Self-pay | Admitting: Internal Medicine

## 2023-11-24 ENCOUNTER — Other Ambulatory Visit (HOSPITAL_COMMUNITY): Payer: Self-pay

## 2023-11-25 ENCOUNTER — Other Ambulatory Visit (HOSPITAL_COMMUNITY): Payer: Self-pay

## 2023-11-25 ENCOUNTER — Other Ambulatory Visit: Payer: Self-pay

## 2023-11-25 MED ORDER — MONTELUKAST SODIUM 10 MG PO TABS
10.0000 mg | ORAL_TABLET | Freq: Every day | ORAL | 3 refills | Status: DC
  Filled 2023-11-30 (×2): qty 30, 30d supply, fill #0
  Filled 2023-12-29: qty 30, 30d supply, fill #1
  Filled 2024-01-19 – 2024-01-23 (×2): qty 30, 30d supply, fill #2

## 2023-11-25 MED ORDER — ESTRADIOL 0.05 MG/24HR TD PTTW
1.0000 | MEDICATED_PATCH | TRANSDERMAL | 0 refills | Status: AC
  Filled 2024-01-19: qty 24, 84d supply, fill #0

## 2023-11-25 MED ORDER — PROGESTERONE MICRONIZED 100 MG PO CAPS
100.0000 mg | ORAL_CAPSULE | Freq: Every day | ORAL | 0 refills | Status: AC
  Filled 2023-11-25 – 2024-01-19 (×2): qty 90, 90d supply, fill #0

## 2023-11-25 MED ORDER — FAMOTIDINE 40 MG PO TABS
40.0000 mg | ORAL_TABLET | Freq: Every day | ORAL | 0 refills | Status: DC | PRN
  Filled 2023-11-25: qty 30, 30d supply, fill #0

## 2023-11-30 ENCOUNTER — Other Ambulatory Visit (HOSPITAL_COMMUNITY): Payer: Self-pay

## 2023-11-30 ENCOUNTER — Other Ambulatory Visit: Payer: Self-pay

## 2023-12-15 DIAGNOSIS — J3089 Other allergic rhinitis: Secondary | ICD-10-CM | POA: Diagnosis not present

## 2023-12-15 DIAGNOSIS — J301 Allergic rhinitis due to pollen: Secondary | ICD-10-CM | POA: Diagnosis not present

## 2023-12-15 DIAGNOSIS — J3081 Allergic rhinitis due to animal (cat) (dog) hair and dander: Secondary | ICD-10-CM | POA: Diagnosis not present

## 2023-12-19 ENCOUNTER — Encounter (HOSPITAL_COMMUNITY): Payer: Self-pay | Admitting: Pharmacist

## 2023-12-19 ENCOUNTER — Other Ambulatory Visit (HOSPITAL_COMMUNITY): Payer: Self-pay

## 2023-12-29 ENCOUNTER — Other Ambulatory Visit: Payer: Self-pay | Admitting: Internal Medicine

## 2023-12-29 ENCOUNTER — Other Ambulatory Visit (HOSPITAL_COMMUNITY): Payer: Self-pay

## 2023-12-29 DIAGNOSIS — J3089 Other allergic rhinitis: Secondary | ICD-10-CM | POA: Diagnosis not present

## 2023-12-29 DIAGNOSIS — J301 Allergic rhinitis due to pollen: Secondary | ICD-10-CM | POA: Diagnosis not present

## 2023-12-29 DIAGNOSIS — J3081 Allergic rhinitis due to animal (cat) (dog) hair and dander: Secondary | ICD-10-CM | POA: Diagnosis not present

## 2023-12-30 ENCOUNTER — Other Ambulatory Visit (HOSPITAL_COMMUNITY): Payer: Self-pay

## 2023-12-30 ENCOUNTER — Other Ambulatory Visit: Payer: Self-pay

## 2023-12-30 MED ORDER — OMEPRAZOLE 40 MG PO CPDR
40.0000 mg | DELAYED_RELEASE_CAPSULE | Freq: Every day | ORAL | 2 refills | Status: AC
  Filled 2023-12-30: qty 60, 60d supply, fill #0

## 2024-01-19 ENCOUNTER — Other Ambulatory Visit: Payer: Self-pay

## 2024-01-19 ENCOUNTER — Encounter: Payer: Self-pay | Admitting: Pharmacist

## 2024-01-19 ENCOUNTER — Other Ambulatory Visit: Payer: Self-pay | Admitting: Internal Medicine

## 2024-01-19 ENCOUNTER — Other Ambulatory Visit (HOSPITAL_COMMUNITY): Payer: Self-pay

## 2024-01-19 MED ORDER — FAMOTIDINE 40 MG PO TABS
40.0000 mg | ORAL_TABLET | Freq: Every day | ORAL | 0 refills | Status: AC | PRN
  Filled 2024-01-19: qty 30, 30d supply, fill #0

## 2024-01-20 ENCOUNTER — Other Ambulatory Visit: Payer: Self-pay

## 2024-01-26 ENCOUNTER — Other Ambulatory Visit (HOSPITAL_COMMUNITY): Payer: Self-pay

## 2024-01-26 ENCOUNTER — Other Ambulatory Visit (HOSPITAL_BASED_OUTPATIENT_CLINIC_OR_DEPARTMENT_OTHER): Payer: Self-pay

## 2024-01-26 MED ORDER — MONTELUKAST SODIUM 10 MG PO TABS
10.0000 mg | ORAL_TABLET | Freq: Every day | ORAL | 3 refills | Status: AC
  Filled 2024-01-26: qty 90, 90d supply, fill #0

## 2024-01-26 MED ORDER — LEVOCETIRIZINE DIHYDROCHLORIDE 5 MG PO TABS
5.0000 mg | ORAL_TABLET | Freq: Every day | ORAL | 3 refills | Status: AC
  Filled 2024-01-26: qty 90, 90d supply, fill #0

## 2024-01-27 ENCOUNTER — Other Ambulatory Visit (HOSPITAL_COMMUNITY): Payer: Self-pay

## 2024-02-16 ENCOUNTER — Encounter: Payer: Self-pay | Admitting: Internal Medicine

## 2024-02-16 NOTE — Patient Instructions (Addendum)
 "     Blood work was ordered.       Medications changes include :   None    A referral was ordered and someone will call you to schedule an appointment.     Return in about 1 year (around 02/16/2025) for Physical Exam.    Health Maintenance, Female Adopting a healthy lifestyle and getting preventive care are important in promoting health and wellness. Ask your health care provider about: The right schedule for you to have regular tests and exams. Things you can do on your own to prevent diseases and keep yourself healthy. What should I know about diet, weight, and exercise? Eat a healthy diet  Eat a diet that includes plenty of vegetables, fruits, low-fat dairy products, and lean protein. Do not eat a lot of foods that are high in solid fats, added sugars, or sodium. Maintain a healthy weight Body mass index (BMI) is used to identify weight problems. It estimates body fat based on height and weight. Your health care provider can help determine your BMI and help you achieve or maintain a healthy weight. Get regular exercise Get regular exercise. This is one of the most important things you can do for your health. Most adults should: Exercise for at least 150 minutes each week. The exercise should increase your heart rate and make you sweat (moderate-intensity exercise). Do strengthening exercises at least twice a week. This is in addition to the moderate-intensity exercise. Spend less time sitting. Even light physical activity can be beneficial. Watch cholesterol and blood lipids Have your blood tested for lipids and cholesterol at 56 years of age, then have this test every 5 years. Have your cholesterol levels checked more often if: Your lipid or cholesterol levels are high. You are older than 56 years of age. You are at high risk for heart disease. What should I know about cancer screening? Depending on your health history and family history, you may need to have cancer  screening at various ages. This may include screening for: Breast cancer. Cervical cancer. Colorectal cancer. Skin cancer. Lung cancer. What should I know about heart disease, diabetes, and high blood pressure? Blood pressure and heart disease High blood pressure causes heart disease and increases the risk of stroke. This is more likely to develop in people who have high blood pressure readings or are overweight. Have your blood pressure checked: Every 3-5 years if you are 32-63 years of age. Every year if you are 65 years old or older. Diabetes Have regular diabetes screenings. This checks your fasting blood sugar level. Have the screening done: Once every three years after age 47 if you are at a normal weight and have a low risk for diabetes. More often and at a younger age if you are overweight or have a high risk for diabetes. What should I know about preventing infection? Hepatitis B If you have a higher risk for hepatitis B, you should be screened for this virus. Talk with your health care provider to find out if you are at risk for hepatitis B infection. Hepatitis C Testing is recommended for: Everyone born from [birth year] through 1967. Anyone with known risk factors for hepatitis C. Sexually transmitted infections (STIs) Get screened for STIs, including gonorrhea and chlamydia, if: You are sexually active and are younger than 56 years of age. You are older than 56 years of age and your health care provider tells you that you are at risk for this type of infection. Your sexual  activity has changed since you were last screened, and you are at increased risk for chlamydia or gonorrhea. Ask your health care provider if you are at risk. Ask your health care provider about whether you are at high risk for HIV. Your health care provider may recommend a prescription medicine to help prevent HIV infection. If you choose to take medicine to prevent HIV, you should first get tested for HIV. You  should then be tested every 3 months for as long as you are taking the medicine. Pregnancy If you are about to stop having your period (premenopausal) and you may become pregnant, seek counseling before you get pregnant. Take 400 to 800 micrograms (mcg) of folic acid  every day if you become pregnant. Ask for birth control (contraception) if you want to prevent pregnancy. Osteoporosis and menopause Osteoporosis is a disease in which the bones lose minerals and strength with aging. This can result in bone fractures. If you are 45 years old or older, or if you are at risk for osteoporosis and fractures, ask your health care provider if you should: Be screened for bone loss. Take a calcium or vitamin D supplement to lower your risk of fractures. Be given hormone replacement therapy (HRT) to treat symptoms of menopause. Follow these instructions at home: Alcohol use Do not drink alcohol if: Your health care provider tells you not to drink. You are pregnant, may be pregnant, or are planning to become pregnant. If you drink alcohol: Limit how much you have to: 0-1 drink a day. Know how much alcohol is in your drink. In the [place] one drink equals one 12 oz bottle of beer (355 mL), one 5 oz glass of wine (148 mL), or one 1 oz glass of hard liquor (44 mL). Lifestyle Do not use any products that contain nicotine or tobacco. These products include cigarettes, chewing tobacco, and vaping devices, such as e-cigarettes. If you need help quitting, ask your health care provider. Do not use street drugs. Do not share needles. Ask your health care provider for help if you need support or information about quitting drugs. General instructions Schedule regular health, dental, and eye exams. Stay current with your vaccines. Tell your health care provider if: You often feel depressed. You have ever been abused or do not feel safe at home. This information is not intended to replace advice given to you by  your health care provider. Make sure you discuss any questions you have with your health care provider. Document Revised: 07/06/2023 Document Reviewed: 05/19/2020 Elsevier Patient Education  2025 [employer]. "

## 2024-02-16 NOTE — Progress Notes (Unsigned)
 "   Subjective:    Patient ID: Colleen Schmitt, female    DOB: 02/17/1970, 54 y.o.   MRN: [id number]      HPI Colleen Schmitt is here for a Physical exam and her chronic medical problems.    Getting over a URI.    Has been on zepbound  x 4-6 months - some progress in weight loss, but not as much as she had hoped.  Bruise on right lower leg  - has been there a while - still visible.  The area is still little tender.  She was not sure why the bruise was still slightly present   Medications and allergies reviewed with patient and updated if appropriate.  Medications Ordered Prior to Encounter[1]  Review of Systems  Constitutional:  Negative for fever.  Eyes:  Negative for visual disturbance.  Respiratory:  Positive for cough (with current URI), shortness of breath (with current URI) and wheezing (with current URI).   Cardiovascular:  Positive for leg swelling (mild at time). Negative for chest pain and palpitations.  Gastrointestinal:  Positive for abdominal pain (sometimes). Negative for blood in stool, constipation and diarrhea.       Freq gerd  Genitourinary:  Negative for dysuria.  Musculoskeletal:  Positive for arthralgias (mild). Negative for back pain.  Skin:  Negative for rash.  Neurological:  Negative for light-headedness and headaches.  Psychiatric/Behavioral:  Negative for dysphoric mood. The patient is not nervous/anxious.        Objective:   Vitals:   02/17/24 0900  BP: 120/80  Pulse: 72  Temp: 98.1 F (36.7 C)  SpO2: 98%   Filed Weights   02/17/24 0900  Weight: 210 lb (95.3 kg)   Body mass index is 31.93 kg/m.  BP Readings from Last 3 Encounters:  02/17/24 120/80  12/23/22 122/78  11/11/22 122/80    Wt Readings from Last 3 Encounters:  02/17/24 210 lb (95.3 kg)  12/23/22 210 lb (95.3 kg)  11/11/22 195 lb (88.5 kg)       Physical Exam Constitutional: She appears well-developed and well-nourished. No distress.  HENT:  Head: Normocephalic and  atraumatic.  Right Ear: External ear normal. Normal ear canal and TM Left Ear: External ear normal.  Normal ear canal and TM Mouth/Throat: Oropharynx is clear and moist.  Eyes: Conjunctivae normal.  Neck: Neck supple. No tracheal deviation present. No thyromegaly present.  No carotid bruit  Cardiovascular: Normal rate, regular rhythm and normal heart sounds.   No murmur heard.  No edema. Pulmonary/Chest: Effort normal and breath sounds normal. No respiratory distress. She has no wheezes. She has no rales.  Breast: deferred   Abdominal: Soft. She exhibits no distension. There is no tenderness.  Lymphadenopathy: She has no cervical adenopathy.  Skin: Skin is warm and dry. She is not diaphoretic.  Psychiatric: She has a normal mood and affect. Her behavior is normal.     Lab Results  Component Value Date   WBC 3.4 (L) 12/23/2022   HGB 13.9 12/23/2022   HCT 41.7 12/23/2022   PLT 260.0 12/23/2022   GLUCOSE 89 12/23/2022   CHOL 200 12/23/2022   TRIG 105.0 12/23/2022   HDL 68.70 12/23/2022   LDLCALC 110 (H) 12/23/2022   ALT 11 12/23/2022   AST 14 12/23/2022   NA 139 12/23/2022   K 4.2 12/23/2022   CL 103 12/23/2022   CREATININE 0.74 12/23/2022   BUN 13 12/23/2022   CO2 28 12/23/2022   TSH 1.90 12/23/2022   HGBA1C  5.5 12/23/2022         Assessment & Plan:   Physical exam: Screening blood work  ordered Exercise  walking, some weights-walking less since the weather has been bad-stressed increased cardio Weight  obese-actively working on weight loss Substance abuse  none   Reviewed recommended immunizations.   Health Maintenance  Topic Date Due   Cervical Cancer Screening (HPV/Pap Cotest)  Never done   Mammogram  Never done   COVID-19 Vaccine (2 - 2025-26 season) 03/03/2024 (Originally 09/12/2023)   DTaP/Tdap/Td (1 - Tdap) 02/16/2025 (Originally 10/07/1987)   Hepatitis B Vaccines 19-59 Average Risk (1 of 3 - 19+ 3-dose series) 02/16/2025 (Originally 10/07/1987)    Pneumococcal Vaccine: 50+ Years (3 of 3 - PCV20 or PCV21) 10/02/2025   Colonoscopy  07/25/2032   Influenza Vaccine  Completed   HPV VACCINES (No Doses Required) Completed   Hepatitis C Screening  Completed   HIV Screening  Completed   Meningococcal B Vaccine  Aged Out   Zoster Vaccines- Shingrix  Discontinued      Will check in on tetanus and hepatitis B vaccine status    See Problem List for Assessment and Plan of chronic medical problems.        [1]  Current Outpatient Medications on File Prior to Visit  Medication Sig Dispense Refill   albuterol  (VENTOLIN  HFA) 108 (90 Base) MCG/ACT inhaler Inhale 2 puffs into the lungs every 6 (six) hours as needed for wheezing or shortness of breath. 6.7 g 0   buPROPion  (WELLBUTRIN  XL) 300 MG 24 hr tablet Take 1 tablet (300 mg total) by mouth daily. 90 tablet 1   Cholecalciferol (VITAMIN D3) 125 MCG (5000 UT) TABS Take 1 tablet by mouth daily.     EPINEPHrine  (EPIPEN  2-PAK) 0.3 mg/0.3 mL IJ SOAJ injection inject as directed if needed for allergic reaction 2 each 1   estradiol  (VIVELLE -DOT) 0.05 MG/24HR patch Place 1 patch (0.05 mg total) onto the skin 2 (two) times a week. 24 patch 0   famotidine  (PEPCID ) 40 MG tablet Take 1 tablet (40 mg total) by mouth daily for heartburn 30 tablet 0   levocetirizine (XYZAL ) 5 MG tablet Take 1 tablet (5 mg total) by mouth daily. 90 tablet 3   mometasone  (NASONEX ) 50 MCG/ACT nasal spray Place 1-2 sprays into each nostril daily. 17 g 3   montelukast  (SINGULAIR ) 10 MG tablet Take 1 tablet (10 mg total) by mouth at bedtime. 90 tablet 3   Multiple Vitamin (MULTIVITAMIN) tablet Take by mouth.     Omega-3 Fatty Acids (OMEGA 3 500 PO) Take 1 capsule by mouth daily.     omeprazole  (PRILOSEC) 40 MG capsule Take 1 capsule (40 mg total) by mouth daily and 2 times per day when needed 30 minutes before meals. 60 capsule 2   Probiotic Product (PROBIOTIC DAILY) CAPS Take 1 capsule by mouth daily.     progesterone   (PROMETRIUM ) 100 MG capsule Take 1 capsule (100 mg total) by mouth at bedtime. 90 capsule 0   tirzepatide  (ZEPBOUND ) 10 MG/0.5ML Pen Inject 10 mg into the skin.     triamcinolone  (NASACORT ) 55 MCG/ACT AERO nasal inhaler Place 1 spray into the nose daily. 16.9 mL 11   Zinc 30 MG CAPS Take 1 capsule by mouth daily.     No current facility-administered medications on file prior to visit.   "

## 2024-02-17 ENCOUNTER — Ambulatory Visit: Admitting: Internal Medicine

## 2024-02-17 VITALS — BP 120/80 | HR 72 | Temp 98.1°F | Ht 68.0 in | Wt 210.0 lb

## 2024-02-17 DIAGNOSIS — J4520 Mild intermittent asthma, uncomplicated: Secondary | ICD-10-CM

## 2024-02-17 DIAGNOSIS — K219 Gastro-esophageal reflux disease without esophagitis: Secondary | ICD-10-CM

## 2024-02-17 DIAGNOSIS — J309 Allergic rhinitis, unspecified: Secondary | ICD-10-CM

## 2024-02-17 DIAGNOSIS — E66811 Obesity, class 1: Secondary | ICD-10-CM

## 2024-02-17 DIAGNOSIS — F419 Anxiety disorder, unspecified: Secondary | ICD-10-CM

## 2024-02-17 DIAGNOSIS — G4733 Obstructive sleep apnea (adult) (pediatric): Secondary | ICD-10-CM

## 2024-02-17 DIAGNOSIS — Z0000 Encounter for general adult medical examination without abnormal findings: Secondary | ICD-10-CM

## 2024-02-17 NOTE — Assessment & Plan Note (Signed)
Chronic  Well controlled On allergy injections Management per Dr Donneta Romberg

## 2024-02-17 NOTE — Assessment & Plan Note (Signed)
 Chronic  Well controlled On allergy injections, allergy medications Albuterol inhaler as needed Management per Dr Dinosaur Callas

## 2024-02-17 NOTE — Assessment & Plan Note (Addendum)
 Chronic Not controlled Sees Dr Kristie Currently taking Pepcid  40 mg daily and omeprazole  40 mg daily as needed Since symptoms are not controlled would advise taking both medications-omeprazole  30 minutes prior to a meal daily and Pepcid  at the opposite end of the day daily Once symptoms are well-controlled would taper off the Pepcid  and continue the omeprazole  and if symptoms remain controlled could try tapering down on the omeprazole  Working on weight loss

## 2024-02-17 NOTE — Assessment & Plan Note (Signed)
 Chronic not using CPAP Did try a mouthpiece, but that was not helpful Actively working on weight loss

## 2024-02-17 NOTE — Assessment & Plan Note (Signed)
 Chronic Controlled Continue Wellbutrin  XL 300 mg daily

## 2024-02-17 NOTE — Assessment & Plan Note (Addendum)
 Chronic BMI 31.93 Currently getting compounded Zepbound  through her GYN office Has seen some weight loss, but not as much as she is hoped Increase cardio exercise if possible. Stressed protein intake

## 2025-02-18 ENCOUNTER — Encounter: Admitting: Internal Medicine
